# Patient Record
Sex: Female | Born: 1955 | Race: White | Hispanic: No | Marital: Married | Smoking: Former smoker
Health system: Southern US, Community
[De-identification: ages and names within clinical notes are randomized; demographics above are authoritative.]

## PROBLEM LIST (undated history)

## (undated) DIAGNOSIS — I10 Essential (primary) hypertension: Secondary | ICD-10-CM

## (undated) DIAGNOSIS — J189 Pneumonia, unspecified organism: Secondary | ICD-10-CM

## (undated) DIAGNOSIS — D689 Coagulation defect, unspecified: Secondary | ICD-10-CM

## (undated) DIAGNOSIS — M1990 Unspecified osteoarthritis, unspecified site: Secondary | ICD-10-CM

## (undated) DIAGNOSIS — R51 Headache: Secondary | ICD-10-CM

## (undated) DIAGNOSIS — C801 Malignant (primary) neoplasm, unspecified: Secondary | ICD-10-CM

## (undated) DIAGNOSIS — T7840XA Allergy, unspecified, initial encounter: Secondary | ICD-10-CM

## (undated) DIAGNOSIS — R0902 Hypoxemia: Secondary | ICD-10-CM

## (undated) DIAGNOSIS — R251 Tremor, unspecified: Secondary | ICD-10-CM

## (undated) DIAGNOSIS — G709 Myoneural disorder, unspecified: Secondary | ICD-10-CM

## (undated) DIAGNOSIS — R519 Headache, unspecified: Secondary | ICD-10-CM

## (undated) DIAGNOSIS — Z5189 Encounter for other specified aftercare: Secondary | ICD-10-CM

## (undated) DIAGNOSIS — N189 Chronic kidney disease, unspecified: Secondary | ICD-10-CM

## (undated) DIAGNOSIS — Z87442 Personal history of urinary calculi: Secondary | ICD-10-CM

## (undated) HISTORY — DX: Allergy, unspecified, initial encounter: T78.40XA

## (undated) HISTORY — DX: Hypoxemia: R09.02

## (undated) HISTORY — DX: Essential (primary) hypertension: I10

## (undated) HISTORY — DX: Coagulation defect, unspecified: D68.9

## (undated) HISTORY — PX: TONSILLECTOMY AND ADENOIDECTOMY: SHX28

## (undated) HISTORY — DX: Personal history of urinary calculi: Z87.442

## (undated) HISTORY — PX: ABDOMINAL HYSTERECTOMY: SHX81

## (undated) HISTORY — PX: WISDOM TOOTH EXTRACTION: SHX21

## (undated) HISTORY — PX: COLONOSCOPY: SHX174

## (undated) HISTORY — DX: Encounter for other specified aftercare: Z51.89

## (undated) HISTORY — DX: Unspecified osteoarthritis, unspecified site: M19.90

## (undated) HISTORY — DX: Myoneural disorder, unspecified: G70.9

---

## 1991-02-21 DIAGNOSIS — Z5189 Encounter for other specified aftercare: Secondary | ICD-10-CM

## 1991-02-21 HISTORY — DX: Encounter for other specified aftercare: Z51.89

## 2006-02-20 DIAGNOSIS — Z87442 Personal history of urinary calculi: Secondary | ICD-10-CM

## 2006-02-20 HISTORY — DX: Personal history of urinary calculi: Z87.442

## 2007-10-28 ENCOUNTER — Ambulatory Visit: Payer: Self-pay | Admitting: Occupational Medicine

## 2007-11-12 ENCOUNTER — Ambulatory Visit: Payer: Self-pay | Admitting: Occupational Medicine

## 2008-04-25 ENCOUNTER — Ambulatory Visit: Payer: Self-pay | Admitting: Family Medicine

## 2008-04-25 LAB — CONVERTED CEMR LAB: Rapid Strep: NEGATIVE

## 2010-01-26 ENCOUNTER — Ambulatory Visit: Payer: Self-pay | Admitting: Emergency Medicine

## 2010-03-22 NOTE — Assessment & Plan Note (Signed)
Summary: Cough-yellowish green, fever, Ear pain - B x 3 dys rm 5   Vital Signs:  Patient Profile:   55 Years Old Female CC:      Cold & URI symptoms Height:     64 inches Weight:      178 pounds O2 Sat:      100 % O2 treatment:    Room Air Temp:     98.4 degrees F oral Pulse rate:   116 / minute Pulse rhythm:   irregular Resp:     16 per minute BP sitting:   141 / 81  (left arm) Cuff size:   regular  Vitals Entered By: Areta Haber CMA (January 26, 2010 8:20 AM)                  Current Allergies: No known allergies History of Present Illness History from: patient Chief Complaint: Cold & URI symptoms History of Present Illness: Patient complains of onset of cold symptoms for 4 days.  They have been using Tylenol and Motrinwhich is helping a little bit. + sore throat + cough No pleuritic pain No wheezing + nasal congestion + post-nasal drainage No sinus pain/pressure No itchy/red eyes No earache No hemoptysis No SOB + chills/sweats No fever + nausea No vomiting No abdominal pain No diarrhea No skin rashes + fatigue No myalgias No headache   Current Problems: URI (ICD-465.9) RASH AND OTHER NONSPECIFIC SKIN ERUPTION (ICD-782.1) VIRAL EXANTHEM (ICD-057.9)   Current Meds TYLENOL 325 MG TABS (ACETAMINOPHEN) as directed IBUPROFEN 200 MG TABS (IBUPROFEN) as directed ZITHROMAX Z-PAK 250 MG TABS (AZITHROMYCIN) use as directed CHERATUSSIN AC 100-10 MG/5ML SYRP (GUAIFENESIN-CODEINE) 5cc Q4-6hrs as needed for cough  REVIEW OF SYSTEMS Constitutional Symptoms       Complains of fever.     Denies chills, night sweats, weight loss, weight gain, and fatigue.  Eyes       Denies change in vision, eye pain, eye discharge, glasses, contact lenses, and eye surgery. Ear/Nose/Throat/Mouth       Complains of ear pain, sore throat, and hoarseness.      Denies hearing loss/aids, change in hearing, ear discharge, dizziness, frequent runny nose, frequent nose bleeds,  sinus problems, and tooth pain or bleeding.      Comments: B x 3 dys Respiratory       Complains of productive cough.      Denies dry cough, wheezing, shortness of breath, asthma, bronchitis, and emphysema/COPD.  Cardiovascular       Denies murmurs, chest pain, and tires easily with exhertion.    Gastrointestinal       Denies stomach pain, nausea/vomiting, diarrhea, constipation, blood in bowel movements, and indigestion. Genitourniary       Denies painful urination, kidney stones, and loss of urinary control. Neurological       Denies paralysis, seizures, and fainting/blackouts. Musculoskeletal       Denies muscle pain, joint pain, joint stiffness, decreased range of motion, redness, swelling, muscle weakness, and gout.  Skin       Denies bruising, unusual mles/lumps or sores, and hair/skin or nail changes.  Psych       Denies mood changes, temper/anger issues, anxiety/stress, speech problems, depression, and sleep problems. Other Comments: yellowish green. Pt has not seen her PCP for this.   Past History:  Social History: Last updated: 04/25/2008 denies drinking, smoking or recreational drug use Physical Exam General appearance: well developed, well nourished,coughing Nasal: clear discharge Oral/Pharynx: pharyngeal erythema without exudate, uvula midline  without deviation.  clear PND Neck: tender ant cerv LAD Chest/Lungs: no rales, wheezes, or rhonchi bilateral, breath sounds equal without effort Heart: regular rate and  rhythm, no murmur Skin: no obvious rashes or lesions MSE: oriented to time, place, and person Assessment Viral URI/bronchitis  Patient Education: Patient and/or caregiver instructed in the following: rest, fluids.  Plan New Medications/Changes: CHERATUSSIN AC 100-10 MG/5ML SYRP (GUAIFENESIN-CODEINE) 5cc Q4-6hrs as needed for cough  #6oz x 0, 01/26/2010, Hoyt Koch MD ZITHROMAX Z-PAK 250 MG TABS (AZITHROMYCIN) use as directed  #1 x 0, 01/26/2010,  Hoyt Koch MD  New Orders: Est. Patient Level III [phone] Planning Comments:   1)  Take the prescribed antibiotic as instructed. 2)  Use nasal saline solution (over the counter) at least 3 times a day. 3)  Use over the counter decongestants like Zyrtec-D every 12 hours as needed to help with congestion. 4)  Can take tylenol every 6 hours or motrin every 8 hours for pain or fever. 5)  Follow up with your primary doctor  if no improvement in 5-7 days, sooner if increasing pain, fever, or new symptoms.     The patient and/or caregiver has been counseled thoroughly with regard to medications prescribed including dosage, schedule, interactions, rationale for use, and possible side effects and they verbalize understanding.  Diagnoses and expected course of recovery discussed and will return if not improved as expected or if the condition worsens. Patient and/or caregiver verbalized understanding.  Prescriptions: CHERATUSSIN AC 100-10 MG/5ML SYRP (GUAIFENESIN-CODEINE) 5cc Q4-6hrs as needed for cough  #6oz x 0   Entered and Authorized by:   Hoyt Koch MD   Signed by:   Hoyt Koch MD on 01/26/2010   Method used:   Print then Give to Patient   RxID:   [id number] ZITHROMAX Z-PAK 250 MG TABS (AZITHROMYCIN) use as directed  #1 x 0   Entered and Authorized by:   Hoyt Koch MD   Signed by:   Hoyt Koch MD on 01/26/2010   Method used:   Print then Give to Patient   RxID:   [id number]   Orders Added: 1)  Est. Patient Level III [29528]

## 2010-04-30 ENCOUNTER — Ambulatory Visit (INDEPENDENT_AMBULATORY_CARE_PROVIDER_SITE_OTHER): Payer: Self-pay | Admitting: Emergency Medicine

## 2010-04-30 ENCOUNTER — Encounter: Payer: Self-pay | Admitting: Emergency Medicine

## 2010-04-30 DIAGNOSIS — J069 Acute upper respiratory infection, unspecified: Secondary | ICD-10-CM

## 2010-05-03 NOTE — Assessment & Plan Note (Signed)
Summary: SINUS ISSUES,FEVER,COUGH,SORE THROAT/WSE(rm 4)   Vital Signs:  Patient Profile:   55 Years Old Female CC:      sick,sinus, sore throat Height:     64 inches O2 Sat:      97 % O2 treatment:    Room Air Temp:     97.5 degrees F oral Pulse rate:   98 / minute Resp:     20 per minute BP sitting:   162 / 83  (left arm) Cuff size:   regular  Vitals Entered By: Linton Flemings RN (April 30, 2010 1:52 PM)                  Updated Prior Medication List: none Current Allergies: No known allergies History of Present Illness History from: patient Chief Complaint: sick,sinus, sore throat History of Present Illness: 55 Years Old Female complains of onset of cold symptoms for 1 week.  Kimberly Griffith has been using OTC meds which is helping a little bit.  She gets sick every time her grandkids visit her.  Also her son was sick last week with a virus.  She has lots of meetings this week and needs to feel better quickly. + sore throat + cough No pleuritic pain No wheezing + nasal congestion + post-nasal drainage ++ sinus pain/pressure No chest congestion No itchy/red eyes + earache No hemoptysis No SOB + chills/sweats No fever No nausea No vomiting No abdominal pain No diarrhea No skin rashes + fatigue + myalgias + headache   REVIEW OF SYSTEMS Constitutional Symptoms       Complains of fever.     Denies chills, night sweats, weight loss, weight gain, and fatigue.  Eyes       Complains of eye pain.      Denies change in vision, eye discharge, glasses, contact lenses, and eye surgery. Ear/Nose/Throat/Mouth       Complains of frequent runny nose, sinus problems, sore throat, and hoarseness.      Denies hearing loss/aids, change in hearing, ear pain, ear discharge, dizziness, frequent nose bleeds, and tooth pain or bleeding.  Respiratory       Complains of productive cough and shortness of breath.      Denies wheezing, asthma, bronchitis, and emphysema/COPD.      Comments:  grreenish sputum Cardiovascular       Denies murmurs, chest pain, and tires easily with exhertion.    Gastrointestinal       Complains of nausea/vomiting.      Denies stomach pain, diarrhea, constipation, blood in bowel movements, and indigestion. Genitourniary       Denies painful urination, kidney stones, and loss of urinary control. Neurological       Complains of headaches.      Denies paralysis, seizures, and fainting/blackouts. Musculoskeletal       Denies muscle pain, joint pain, joint stiffness, decreased range of motion, redness, swelling, muscle weakness, and gout.  Skin       Denies bruising, unusual mles/lumps or sores, and hair/skin or nail changes.  Psych       Denies mood changes, temper/anger issues, anxiety/stress, speech problems, depression, and sleep problems. Other Comments: started one week ago after visiting grandchildren with cold   Past History:  Past Medical History: Unremarkable  Past Surgical History: Denies surgical history Physical Exam General appearance: well developed, well nourished, mild distress Head: maxillary sinus tenderness Ears: normal, no lesions or deformities Nasal: mucosa pink, nonedematous, no septal deviation, turbinates normal Oral/Pharynx: tongue normal, posterior  pharynx without erythema or exudate Neck: ant cerv tenderness Chest/Lungs: no rales, wheezes, or rhonchi bilateral, breath sounds equal without effort Heart: regular rate and  rhythm, no murmur MSE: oriented to time, place, and person Assessment New Problems: UPPER RESPIRATORY INFECTION, ACUTE (ICD-465.9)   Patient Education: Patient and/or caregiver instructed in the following: rest, fluids.  Plan New Medications/Changes: PREDNISONE 20 MG TABS (PREDNISONE) 20mg  two times a day for 4 days  #QS x 0, 04/30/2010, Hoyt Koch MD AUGMENTIN [phone] MG TABS (AMOXICILLIN-POT CLAVULANATE) 1 by mouth two times a day for 7 days  #14 x 0, 04/30/2010, Hoyt Koch  MD  New Orders: Est. Patient Level IV [04540] Pulse Oximetry (single measurment) [98119] Planning Comments:   1)  Take the prescribed antibiotic as instructed.  Prednisone will help your symptoms but not cure anything.  This may help you get back to work faster. 2)  Use nasal saline solution (over the counter) at least 3 times a day. 3)  HTN, so no decongestants 4)  Can take tylenol every 6 hours or motrin every 8 hours for pain or fever. 5)  Follow up with your primary doctor  if no improvement in 5-7 days, sooner if increasing pain, fever, or new symptoms.    The patient and/or caregiver has been counseled thoroughly with regard to medications prescribed including dosage, schedule, interactions, rationale for use, and possible side effects and they verbalize understanding.  Diagnoses and expected course of recovery discussed and will return if not improved as expected or if the condition worsens. Patient and/or caregiver verbalized understanding.  Prescriptions: PREDNISONE 20 MG TABS (PREDNISONE) 20mg  two times a day for 4 days  #QS x 0   Entered and Authorized by:   Hoyt Koch MD   Signed by:   Hoyt Koch MD on 04/30/2010   Method used:   Print then Give to Patient   RxID:   [id number] AUGMENTIN 875-125 MG TABS (AMOXICILLIN-POT CLAVULANATE) 1 by mouth two times a day for 7 days  #14 x 0   Entered and Authorized by:   Hoyt Koch MD   Signed by:   Hoyt Koch MD on 04/30/2010   Method used:   Print then Give to Patient   RxID:   [id number]   Orders Added: 1)  Est. Patient Level IV [01027] 2)  Pulse Oximetry (single measurment) [25366]

## 2011-11-21 ENCOUNTER — Ambulatory Visit (INDEPENDENT_AMBULATORY_CARE_PROVIDER_SITE_OTHER): Payer: Managed Care, Other (non HMO) | Admitting: Family Medicine

## 2011-11-21 VITALS — BP 160/83 | HR 99 | Temp 98.3°F | Resp 16 | Ht 63.7 in | Wt 195.0 lb

## 2011-11-21 DIAGNOSIS — N390 Urinary tract infection, site not specified: Secondary | ICD-10-CM

## 2011-11-21 LAB — POCT UA - MICROSCOPIC ONLY
Casts, Ur, LPF, POC: NEGATIVE
Crystals, Ur, HPF, POC: NEGATIVE
Mucus, UA: NEGATIVE
Yeast, UA: NEGATIVE

## 2011-11-21 LAB — POCT URINALYSIS DIPSTICK
Bilirubin, UA: NEGATIVE
Glucose, UA: NEGATIVE
Ketones, UA: NEGATIVE
Nitrite, UA: POSITIVE
Protein, UA: 30
Spec Grav, UA: 1.015
Urobilinogen, UA: 0.2
pH, UA: 6

## 2011-11-21 MED ORDER — PHENAZOPYRIDINE HCL 100 MG PO TABS: 100.0000 mg | ORAL_TABLET | Freq: Three times a day (TID) | ORAL | Status: DC | PRN

## 2011-11-21 MED ORDER — SULFAMETHOXAZOLE-TRIMETHOPRIM 800-160 MG PO TABS: 1.0000 | ORAL_TABLET | Freq: Two times a day (BID) | ORAL | Status: DC

## 2011-11-21 NOTE — Patient Instructions (Signed)
Urinary Tract Infection Urinary tract infections (UTIs) can develop anywhere along your urinary tract. Your urinary tract is your body's drainage system for removing wastes and extra water. Your urinary tract includes two kidneys, two ureters, a bladder, and a urethra. Your kidneys are a pair of bean-shaped organs. Each kidney is about the size of your fist. They are located below your ribs, one on each side of your spine. CAUSES Infections are caused by microbes, which are microscopic organisms, including fungi, viruses, and bacteria. These organisms are so small that they can only be seen through a microscope. Bacteria are the microbes that most commonly cause UTIs. SYMPTOMS  Symptoms of UTIs may vary by age and gender of the patient and by the location of the infection. Symptoms in young women typically include a frequent and intense urge to urinate and a painful, burning feeling in the bladder or urethra during urination. Older women and men are more likely to be tired, shaky, and weak and have muscle aches and abdominal pain. A fever may mean the infection is in your kidneys. Other symptoms of a kidney infection include pain in your back or sides below the ribs, nausea, and vomiting. DIAGNOSIS To diagnose a UTI, your caregiver will ask you about your symptoms. Your caregiver also will ask to provide a urine sample. The urine sample will be tested for bacteria and white blood cells. White blood cells are made by your body to help fight infection. TREATMENT  Typically, UTIs can be treated with medication. Because most UTIs are caused by a bacterial infection, they usually can be treated with the use of antibiotics. The choice of antibiotic and length of treatment depend on your symptoms and the type of bacteria causing your infection. HOME CARE INSTRUCTIONS  If you were prescribed antibiotics, take them exactly as your caregiver instructs you. Finish the medication even if you feel better after you  have only taken some of the medication.  Drink enough water and fluids to keep your urine clear or pale yellow.  Avoid caffeine, tea, and carbonated beverages. They tend to irritate your bladder.  Empty your bladder often. Avoid holding urine for long periods of time.  Empty your bladder before and after sexual intercourse.  After a bowel movement, women should cleanse from front to back. Use each tissue only once. SEEK MEDICAL CARE IF:   You have back pain.  You develop a fever.  Your symptoms do not begin to resolve within 3 days. SEEK IMMEDIATE MEDICAL CARE IF:   You have severe back pain or lower abdominal pain.  You develop chills.  You have nausea or vomiting.  You have continued burning or discomfort with urination. MAKE SURE YOU:   Understand these instructions.  Will watch your condition.  Will get help right away if you are not doing well or get worse. Document Released: 11/16/2004 Document Revised: 08/08/2011 Document Reviewed: 03/17/2011 [employer] Patient Information 2013 [employer].  

## 2011-11-21 NOTE — Progress Notes (Signed)
Subjective:   Results for orders placed in visit on 11/21/11  POCT URINALYSIS DIPSTICK      Component Value Range   Color, UA yellow     Clarity, UA cloudy     Glucose, UA neg     Bilirubin, UA neg     Ketones, UA neg     Spec Grav, UA 1.015     Blood, UA large     pH, UA 6.0     Protein, UA 30     Urobilinogen, UA 0.2     Nitrite, UA positive     Leukocytes, UA large (3+)    POCT UA - MICROSCOPIC ONLY      Component Value Range   WBC, Ur, HPF, POC TNTC     RBC, urine, microscopic TNTC     Bacteria, U Microscopic 3+ bacilli     Mucus, UA neg     Epithelial cells, urine per micros 0-1     Crystals, Ur, HPF, POC neg     Casts, Ur, LPF, POC neg     Yeast, UA neg    S:  Dysuria and frequency since yesterday  Hx of prior UTI, several years ago  Hx of kidney stones in the past  O; Abd mild tenderness. No CVA tenderness  A: UTI  Plan: Bactrim pyridium

## 2011-11-24 LAB — URINE CULTURE: Colony Count: >=100000

## 2011-11-26 MED ORDER — NITROFURANTOIN MONOHYD MACRO 100 MG PO CAPS: 100.0000 mg | ORAL_CAPSULE | Freq: Two times a day (BID) | ORAL | Status: DC

## 2011-11-26 NOTE — Addendum Note (Signed)
Addended by: Johnnette Litter on: 11/26/2011 08:42 AM   Modules accepted: Orders

## 2011-12-08 DIAGNOSIS — Z23 Encounter for immunization: Secondary | ICD-10-CM

## 2013-01-31 ENCOUNTER — Ambulatory Visit (INDEPENDENT_AMBULATORY_CARE_PROVIDER_SITE_OTHER): Payer: Managed Care, Other (non HMO)

## 2013-01-31 DIAGNOSIS — Z23 Encounter for immunization: Secondary | ICD-10-CM

## 2013-02-20 DIAGNOSIS — J189 Pneumonia, unspecified organism: Secondary | ICD-10-CM

## 2013-02-20 HISTORY — DX: Pneumonia, unspecified organism: J18.9

## 2013-10-19 ENCOUNTER — Ambulatory Visit (INDEPENDENT_AMBULATORY_CARE_PROVIDER_SITE_OTHER): Payer: Managed Care, Other (non HMO) | Admitting: Internal Medicine

## 2013-10-19 VITALS — BP 122/84 | HR 100 | Temp 98.4°F | Resp 15 | Ht 64.0 in | Wt 210.4 lb

## 2013-10-19 DIAGNOSIS — L259 Unspecified contact dermatitis, unspecified cause: Secondary | ICD-10-CM

## 2013-10-19 DIAGNOSIS — L309 Dermatitis, unspecified: Secondary | ICD-10-CM

## 2013-10-19 DIAGNOSIS — R5383 Other fatigue: Secondary | ICD-10-CM

## 2013-10-19 DIAGNOSIS — J019 Acute sinusitis, unspecified: Secondary | ICD-10-CM

## 2013-10-19 DIAGNOSIS — R5381 Other malaise: Secondary | ICD-10-CM

## 2013-10-19 DIAGNOSIS — R635 Abnormal weight gain: Secondary | ICD-10-CM

## 2013-10-19 DIAGNOSIS — R059 Cough, unspecified: Secondary | ICD-10-CM

## 2013-10-19 DIAGNOSIS — R05 Cough: Secondary | ICD-10-CM

## 2013-10-19 LAB — COMPREHENSIVE METABOLIC PANEL
ALT: 24 U/L (ref 0–35)
AST: 20 U/L (ref 0–37)
Albumin: 4.5 g/dL (ref 3.5–5.2)
Alkaline Phosphatase: 74 U/L (ref 39–117)
BUN: 9 mg/dL (ref 6–23)
CO2: 22 meq/L (ref 19–32)
Calcium: 9.2 mg/dL (ref 8.4–10.5)
Chloride: 104 mEq/L (ref 96–112)
Creat: 0.72 mg/dL (ref 0.50–1.10)
GLUCOSE: 112 mg/dL — AB (ref 70–99)
Potassium: 4.1 mEq/L (ref 3.5–5.3)
SODIUM: 138 meq/L (ref 135–145)
TOTAL PROTEIN: 7.8 g/dL (ref 6.0–8.3)
Total Bilirubin: 0.9 mg/dL (ref 0.2–1.2)

## 2013-10-19 LAB — POCT CBC
Granulocyte percent: 67.1 %G (ref 37–80)
HCT, POC: 49.9 % — AB (ref 37.7–47.9)
Hemoglobin: 16 g/dL (ref 12.2–16.2)
LYMPH, POC: 2.5 (ref 0.6–3.4)
MCH: 27.7 pg (ref 27–31.2)
MCHC: 32 g/dL (ref 31.8–35.4)
MCV: 86.3 fL (ref 80–97)
MID (CBC): 0.8 (ref 0–0.9)
MPV: 7.8 fL (ref 0–99.8)
PLATELET COUNT, POC: 225 10*3/uL (ref 142–424)
POC Granulocyte: 6.8 (ref 2–6.9)
POC LYMPH %: 24.8 % (ref 10–50)
POC MID %: 8.1 % (ref 0–12)
RBC: 5.78 M/uL — AB (ref 4.04–5.48)
RDW, POC: 14.5 %
WBC: 10.1 10*3/uL (ref 4.6–10.2)

## 2013-10-19 LAB — LIPID PANEL
CHOLESTEROL: 227 mg/dL — AB (ref 0–200)
HDL: 37 mg/dL — ABNORMAL LOW (ref >39)
LDL Cholesterol: 156 mg/dL — ABNORMAL HIGH (ref 0–99)
Total CHOL/HDL Ratio: 6.1 Ratio
Triglycerides: 171 mg/dL — ABNORMAL HIGH (ref <150)
VLDL: 34 mg/dL (ref 0–40)

## 2013-10-19 LAB — T4, FREE: Free T4: 1.08 ng/dL (ref 0.80–1.80)

## 2013-10-19 LAB — TSH: TSH: 0.487 u[IU]/mL (ref 0.350–4.500)

## 2013-10-19 LAB — POCT SEDIMENTATION RATE: POCT SED RATE: 25 mm/hr — AB (ref 0–22)

## 2013-10-19 MED ORDER — AMOXICILLIN 875 MG PO TABS: 875.0000 mg | ORAL_TABLET | Freq: Two times a day (BID) | ORAL | Status: DC

## 2013-10-19 MED ORDER — TRIAMCINOLONE 0.1 % CREAM:EUCERIN CREAM 1:1: 1.0000 "application " | TOPICAL_CREAM | Freq: Two times a day (BID) | CUTANEOUS | Status: DC | PRN

## 2013-10-19 MED ORDER — HYDROCODONE-HOMATROPINE 5-1.5 MG/5ML PO SYRP: 5.0000 mL | ORAL_SOLUTION | Freq: Four times a day (QID) | ORAL | Status: DC | PRN

## 2013-10-19 NOTE — Progress Notes (Signed)
Subjective:    Patient ID: Kimberly Griffith, female    DOB: 1956-01-29, 58 y.o.   MRN: [id number]  HPI  Chief Complaint  Patient presents with  . Fever    x 4 days  . Sore Throat    x 4 days  . Nasal Congestion    clear drainage x 4 days  Ears hurt as well  [facility] fam hx htn--no dx for her  Review of Systems Weight gain despite low cal intake Often cold Losing eyebrows Dry coarse skin Hx eczema Fatigue desp sed job Often sleepy BMs 2/d on probiotics--lots if not--hx ? UC 75yr ago ruled out//no colonos since 50     Objective:   Physical Exam  Constitutional: She appears well-developed and well-nourished.  Overweight  HENT:  Right Ear: External ear normal.  Left Ear: External ear normal.  Mouth/Throat: Oropharynx is clear and moist.  Nares with purulent discharge and tender maxillary areas to percussion  Eyes: Pupils are equal, round, and reactive to light.  Conjunctivae injected  Neck: No thyromegaly present.  Cardiovascular: Normal rate and regular rhythm.   No murmur heard. Pulmonary/Chest: Effort normal and breath sounds normal.  Lymphadenopathy:    She has no cervical adenopathy.  Skin:  Several areas of active eczema and dryness   Results for orders placed in visit on 10/19/13  POCT CBC      Result Value Ref Range   WBC 10.1  4.6 - 10.2 K/uL   Lymph, poc 2.5  0.6 - 3.4   POC LYMPH PERCENT 24.8  10 - 50 %L   MID (cbc) 0.8  0 - 0.9   POC MID % 8.1  0 - 12 %M   POC Granulocyte 6.8  2 - 6.9   Granulocyte percent 67.1  37 - 80 %G   RBC 5.78 (*) 4.04 - 5.48 M/uL   Hemoglobin 16.0  12.2 - 16.2 g/dL   HCT, POC 49.9 (*) 37.7 - 47.9 %   MCV 86.3  80 - 97 fL   MCH, POC 27.7  27 - 31.2 pg   MCHC 32.0  31.8 - 35.4 g/dL   RDW, POC 14.5     Platelet Count, POC 225  142 - 424 K/uL   MPV 7.8  0 - 99.8 fL    BP 122/84  Pulse 100  Temp(Src) 98.4 F (36.9 C) (Oral)  Resp 15  Ht 5\' 4"  (1.626 m)  Wt 210 lb 6.4 oz (95.437 kg)  BMI 36.10 kg/m2   SpO2 97% BP Readings from Last 3 Encounters:  10/19/13 122/84  11/21/11 160/83  04/30/10 162/83   Wt Readings from Last 3 Encounters:  10/19/13 210 lb 6.4 oz (95.437 kg)  11/21/11 195 lb (88.451 kg)  01/26/10 178 lb (80.74 kg)      Assessment & Plan:  Weight gain - Plan: POCT SEDIMENTATION RATE, POCT CBC, Comprehensive metabolic panel, TSH, T4, free, Lipid panel  Other malaise and fatigue - Plan: TSH, T4, free  Sinusitis with cough  Eczema  Meds ordered this encounter  Medications  . acetaminophen (TYLENOL) 500 MG tablet    Sig: Take 500 mg by mouth every 6 (six) hours as needed.  . Triamcinolone Acetonide (TRIAMCINOLONE 0.1 % CREAM : EUCERIN) CREA    Sig: Apply 1 application topically 2 (two) times daily as needed. 50:50 mix 360 gm    Dispense:  1 each    Refill:  5  . amoxicillin (AMOXIL) 875 MG  tablet    Sig: Take 1 tablet (875 mg total) by mouth 2 (two) times daily.    Dispense:  20 tablet    Refill:  0  . HYDROcodone-homatropine (HYCODAN) 5-1.5 MG/5ML syrup    Sig: Take 5 mLs by mouth every 6 (six) hours as needed.    Dispense:  120 mL    Refill:  0   Call w/ labs Needs health maint exam 104

## 2013-10-22 ENCOUNTER — Encounter: Payer: Self-pay | Admitting: Internal Medicine

## 2013-12-05 ENCOUNTER — Ambulatory Visit (INDEPENDENT_AMBULATORY_CARE_PROVIDER_SITE_OTHER): Payer: Managed Care, Other (non HMO) | Admitting: Physician Assistant

## 2013-12-05 VITALS — BP 132/80 | HR 101 | Temp 98.8°F | Resp 16 | Ht 64.0 in | Wt 215.6 lb

## 2013-12-05 DIAGNOSIS — R0981 Nasal congestion: Secondary | ICD-10-CM

## 2013-12-05 DIAGNOSIS — R05 Cough: Secondary | ICD-10-CM

## 2013-12-05 DIAGNOSIS — R52 Pain, unspecified: Secondary | ICD-10-CM

## 2013-12-05 DIAGNOSIS — R059 Cough, unspecified: Secondary | ICD-10-CM

## 2013-12-05 MED ORDER — HYDROCOD POLST-CHLORPHEN POLST 10-8 MG/5ML PO LQCR: 5.0000 mL | Freq: Two times a day (BID) | ORAL | Status: DC | PRN

## 2013-12-05 MED ORDER — IPRATROPIUM BROMIDE 0.03 % NA SOLN: 2.0000 | Freq: Two times a day (BID) | NASAL | Status: DC

## 2013-12-05 MED ORDER — GUAIFENESIN ER 1200 MG PO TB12: 1.0000 | ORAL_TABLET | Freq: Two times a day (BID) | ORAL | Status: DC | PRN

## 2013-12-05 NOTE — Progress Notes (Addendum)
I have discussed this patient with Ms. Drenda Freeze and agree.

## 2013-12-05 NOTE — Progress Notes (Signed)
Subjective:    Patient ID: Kimberly Griffith, female    DOB: 1955-11-08, 58 y.o.   MRN: [id number]  Cough Associated symptoms include chills, a fever, headaches, myalgias and a sore throat. Pertinent negatives include no ear pain. There is no history of environmental allergies.  Sore Throat  Associated symptoms include congestion, coughing and headaches. Pertinent negatives include no diarrhea, ear pain or vomiting.  Headache  Associated symptoms include coughing, a fever, nausea and a sore throat. Pertinent negatives include no ear pain, sinus pressure or vomiting.    This is a 58 year old female with no significant PMH who is presenting with 5 days of sore throat, cough, nasal congestion, fever, chills and generalized body aches. It started the day after getting home from a cruise. On the cruise her grandson and husband were sick as well, and both have improved at this point. She reports the illness started with a sore throat and rapidly progressed to fever, chills and her other symptoms. She had some nausea yesterday but no vomiting. She has been taking HBP coricidan for symptoms and fever. Her fever has been around 102. This morning it was 101. She took the HBP coricidan before coming into clinic today. She got the flu shot 2 weeks ago. She is not an asthmatic and not a smoker.   Review of Systems  Constitutional: Positive for fever, chills and fatigue.  HENT: Positive for congestion and sore throat. Negative for ear pain and sinus pressure.   Eyes: Negative.   Respiratory: Positive for cough.   Gastrointestinal: Positive for nausea. Negative for vomiting and diarrhea.  Musculoskeletal: Positive for myalgias.  Skin: Negative.   Allergic/Immunologic: Negative for environmental allergies.  Neurological: Positive for headaches.   Current Outpatient Prescriptions on File Prior to Visit  Medication Sig Dispense Refill  . acetaminophen (TYLENOL) 500 MG tablet Take 500 mg by mouth every 6 (six)  hours as needed.      . Triamcinolone Acetonide (TRIAMCINOLONE 0.1 % CREAM : EUCERIN) CREA Apply 1 application topically 2 (two) times daily as needed. 50:50 mix 360 gm  1 each  5   No current facility-administered medications on file prior to visit.       Objective:   Physical Exam  Constitutional: She is oriented to person, place, and time. She appears well-developed and well-nourished. No distress.  HENT:  Head: Normocephalic and atraumatic.  Right Ear: Hearing, tympanic membrane, external ear and ear canal normal.  Left Ear: Hearing, tympanic membrane, external ear and ear canal normal.  Nose: Mucosal edema present. Right sinus exhibits no maxillary sinus tenderness and no frontal sinus tenderness. Left sinus exhibits no maxillary sinus tenderness and no frontal sinus tenderness.  Mouth/Throat: Uvula is midline and mucous membranes are normal. Posterior oropharyngeal erythema present. No oropharyngeal exudate or posterior oropharyngeal edema.  Eyes: Conjunctivae and lids are normal. Right eye exhibits no discharge. Left eye exhibits no discharge. No scleral icterus.  Cardiovascular: Normal rate, regular rhythm, normal heart sounds and intact distal pulses.   Pulmonary/Chest: Effort normal and breath sounds normal. Not tachypneic. No respiratory distress. She has no wheezes. She has no rhonchi. She has no rales.  Lymphadenopathy:    She has no cervical adenopathy.  Neurological: She is alert and oriented to person, place, and time.  Skin: Skin is warm, dry and intact. No rash noted. She is not diaphoretic.  Psychiatric: She has a normal mood and affect. Her speech is normal and behavior is normal. Thought content normal.  Assessment & Plan:  1. Cough 2. Nasal congestion 3. Body aches  Patient likely has influenza as supported by the rapid onset of her symptoms and the duration of her symptoms. She does not have cormorbid conditions necessitating flu treatment. We discussed the  need for plenty of rest and plenty of fluids. She was prescribed tussionex, mucinex and atrovent for symptom control. She may continue to take the HBP coricidan for her fever. She will return if her symptoms fail to improve.  - Guaifenesin (MUCINEX MAXIMUM STRENGTH) 1200 MG TB12; Take 1 tablet (1,200 mg total) by mouth every 12 (twelve) hours as needed.  Dispense: 14 tablet; Refill: 1 - chlorpheniramine-HYDROcodone (TUSSIONEX PENNKINETIC ER) 10-8 MG/5ML LQCR; Take 5 mLs by mouth every 12 (twelve) hours as needed for cough (cough).  Dispense: 100 mL; Refill: 0 - ipratropium (ATROVENT) 0.03 % nasal spray; Place 2 sprays into both nostrils 2 (two) times daily.  Dispense: 30 mL; Refill: 0   Nicole V. Drenda Freeze, MHS [facility]  12/05/2013

## 2013-12-05 NOTE — Patient Instructions (Signed)
Get plenty of rest, drink plenty of water.  Keep taking tylenol for fever and discomfort as needed.

## 2013-12-13 ENCOUNTER — Ambulatory Visit (INDEPENDENT_AMBULATORY_CARE_PROVIDER_SITE_OTHER): Payer: Managed Care, Other (non HMO)

## 2013-12-13 ENCOUNTER — Ambulatory Visit (INDEPENDENT_AMBULATORY_CARE_PROVIDER_SITE_OTHER): Payer: Managed Care, Other (non HMO) | Admitting: Family Medicine

## 2013-12-13 VITALS — BP 124/86 | HR 90 | Temp 98.1°F | Resp 16 | Ht 64.0 in | Wt 215.0 lb

## 2013-12-13 DIAGNOSIS — R05 Cough: Secondary | ICD-10-CM

## 2013-12-13 DIAGNOSIS — R059 Cough, unspecified: Secondary | ICD-10-CM

## 2013-12-13 DIAGNOSIS — B89 Unspecified parasitic disease: Secondary | ICD-10-CM

## 2013-12-13 DIAGNOSIS — J189 Pneumonia, unspecified organism: Secondary | ICD-10-CM

## 2013-12-13 DIAGNOSIS — J029 Acute pharyngitis, unspecified: Secondary | ICD-10-CM

## 2013-12-13 MED ORDER — FIRST-DUKES MOUTHWASH MT SUSP: 5.0000 mL | OROMUCOSAL | Status: DC | PRN

## 2013-12-13 MED ORDER — LEVOFLOXACIN 500 MG PO TABS: 500.0000 mg | ORAL_TABLET | Freq: Every day | ORAL | Status: DC

## 2013-12-13 MED ORDER — HYDROCOD POLST-CHLORPHEN POLST 10-8 MG/5ML PO LQCR: 5.0000 mL | Freq: Two times a day (BID) | ORAL | Status: AC | PRN

## 2013-12-13 NOTE — Addendum Note (Signed)
Addended by: Robyn Haber on: 12/13/2013 03:47 PM   Modules accepted: Orders

## 2013-12-13 NOTE — Progress Notes (Signed)
Patient ID: Kimberly Griffith MRN: [id number], DOB: 1955/08/30, 58 y.o. Date of Encounter: 12/13/2013, 3:40 PM  This chart was scribed for Dr. Robyn Haber, MD by Erling Conte, Medical Scribe. This patient was seen in Room 11 and the patient's care was started at 3:18 PM.  Primary Physician: PROVIDER NOT IN SYSTEM  Chief Complaint: cough  HPI: 58 y.o. year old female with history below presents with cough for 11 days. Pt states that she presented to our office on 12/05/13 with the same symptoms and was told she had the flu but she notes that she has not gotten any better. Pt has had associated sore throat, chest tightness, nasal congestion, chills, generalized body aches and fever. Pt states that the fever resolved 2 days ago. Also has sharp left sided pain and believes it to be contributory to pulling a muscle from coughing so much. She notes that the symptoms began the day she got home from a cruise from [place]. She states that her son and husband have been sick as well. Pt denies any GI complaints.    Past Medical History  Diagnosis Date  . Allergy     kiwi     Home Meds: Prior to Admission medications   Medication Sig Start Date End Date Taking? Authorizing Provider  acetaminophen (TYLENOL) 500 MG tablet Take 500 mg by mouth every 6 (six) hours as needed.   Yes Historical Provider, MD  chlorpheniramine-HYDROcodone (TUSSIONEX PENNKINETIC ER) 10-8 MG/5ML LQCR Take 5 mLs by mouth every 12 (twelve) hours as needed for cough (cough). 12/05/13 12/19/13 Yes Nicole Bush V, PA-C  Guaifenesin (MUCINEX MAXIMUM STRENGTH) 1200 MG TB12 Take 1 tablet (1,200 mg total) by mouth every 12 (twelve) hours as needed. 12/05/13  Yes Bennett Scrape V, PA-C  ipratropium (ATROVENT) 0.03 % nasal spray Place 2 sprays into both nostrils 2 (two) times daily. 12/05/13  Yes Bennett Scrape V, PA-C  Triamcinolone Acetonide (TRIAMCINOLONE 0.1 % CREAM : EUCERIN) CREA Apply 1 application topically 2 (two) times daily as  needed. 50:50 mix 360 gm 10/19/13  Yes Leandrew Koyanagi, MD    Allergies: No Known Allergies  History   Social History  . Marital Status: Married    Spouse Name: N/A    Number of Children: N/A  . Years of Education: N/A   Occupational History  . Not on file.   Social History Main Topics  . Smoking status: Former Research scientist (life sciences)  . Smokeless tobacco: Never Used  . Alcohol Use: No  . Drug Use: No  . Sexual Activity: Not on file   Other Topics Concern  . Not on file   Social History Narrative  . No narrative on file     Review of Systems: Constitutional: positive for fever, chills. negative for  night sweats, weight changes, or fatigue  HEENT: positive for sore throat.  negative for vision changes, hearing loss, congestion, rhinorrhea, ST, epistaxis, or sinus pressure Cardiovascular: negative for chest pain or palpitations Respiratory: positive for cough, chest tightness, congestion. negative for hemoptysis, wheezing, shortness of breath Abdominal: negative for abdominal pain, nausea, vomiting, diarrhea, or constipation Msk: positive for generalized body aches Dermatological: negative for rash Neurologic: negative for headache, dizziness, or syncope All other systems reviewed and are otherwise negative with the exception to those above and in the HPI.   Physical Exam: Blood pressure 124/86, pulse 90, temperature 98.1 F (36.7 C), temperature source Oral, resp. rate 16, height 5\' 4"  (1.626 m), weight 215 lb (97.523 kg), SpO2 99.00%.,  Body mass index is 36.89 kg/(m^2). General: Well developed, well nourished, in no acute distress. Head: Normocephalic, atraumatic, eyes without discharge, sclera non-icteric, nares are without discharge. Bilateral auditory canals clear, TM's are without perforation, pearly grey and translucent with reflective cone of light bilaterally. Oral cavity moist, reddened posterior pharynx without exudate or peritonsillar abscess. Absent tonsils.  Neck: Supple.  No thyromegaly. Full ROM. No lymphadenopathy. Lungs: Clear bilaterally to auscultation without wheezes or rales. Diffuse bilateral rhonchi. Breathing is unlabored.  Congested cough  Heart: RRR with S1 S2. No murmurs, rubs, or gallops appreciated. Abdomen: Soft, non-tender, non-distended with normoactive bowel sounds. No hepatomegaly. No rebound/guarding. No obvious abdominal masses. Msk:  Strength and tone normal for age. Extremities/Skin: Warm and dry. No clubbing or cyanosis. No edema. No rashes or suspicious lesions. Neuro: Alert and oriented X 3. Moves all extremities spontaneously. Gait is normal. CNII-XII grossly in tact. Psych:  Responds to questions appropriately with a normal affect.     [facility] reading (PRIMARY) by  Dr. Joseph Art CXR shows left lower lobe infiltrated    ASSESSMENT AND PLAN:  58 y.o. year old female with   The encounter diagnosis was Cough. Cough - Plan: DG Chest 2 View, levofloxacin (LEVAQUIN) 500 MG tablet, chlorpheniramine-HYDROcodone (TUSSIONEX PENNKINETIC ER) 10-8 MG/5ML LQCR  Pneumonia due to infectious agent - Plan: levofloxacin (LEVAQUIN) 500 MG tablet    Signed, Robyn Haber, MD 12/13/2013 3:40 PM  I personally performed the services described in this documentation, which was scribed in my presence. The recorded information has been reviewed and is accurate.

## 2013-12-13 NOTE — Patient Instructions (Signed)

## 2014-03-04 ENCOUNTER — Ambulatory Visit (INDEPENDENT_AMBULATORY_CARE_PROVIDER_SITE_OTHER): Payer: Managed Care, Other (non HMO) | Admitting: Physician Assistant

## 2014-03-04 VITALS — BP 128/86 | HR 106 | Temp 98.7°F | Resp 18 | Ht 64.0 in | Wt 210.0 lb

## 2014-03-04 DIAGNOSIS — J069 Acute upper respiratory infection, unspecified: Secondary | ICD-10-CM

## 2014-03-04 DIAGNOSIS — B9789 Other viral agents as the cause of diseases classified elsewhere: Principal | ICD-10-CM

## 2014-03-04 MED ORDER — LEVOFLOXACIN 500 MG PO TABS: 500.0000 mg | ORAL_TABLET | Freq: Every day | ORAL | Status: DC

## 2014-03-04 MED ORDER — MAGIC MOUTHWASH W/LIDOCAINE: 10.0000 mL | ORAL | Status: DC | PRN

## 2014-03-04 MED ORDER — HYDROCOD POLST-CHLORPHEN POLST 10-8 MG/5ML PO LQCR: 5.0000 mL | Freq: Two times a day (BID) | ORAL | Status: AC | PRN

## 2014-03-04 NOTE — Patient Instructions (Addendum)
Use mouthwash every 2-3 hours for sore throat. Use cough syrup at night for sleep. Use tylenol/ibuprofen for fever. Return if not better in 7-10 days.

## 2014-03-04 NOTE — Progress Notes (Signed)
Subjective:    Patient ID: Kimberly Griffith, female    DOB: 15-Jul-1955, 59 y.o.   MRN: [id number]  HPI  This is a 59 year old female with no significant PMH who is presenting with sore throat, sneezing, cough and fever. Fever of 102 last night, 101 this morning. She is complaining of a headache located to the top of her head. Cough is dry, sometimes productive of yellow sputum. She is not sleeping well d/t coughing She denies otalgia, SOB or wheezing. Her husband was treated for a sinus infection 4 days ago. She was sick 1 month ago with pneumonia. She has tried tylenol and mucinex without much relief. She does not have a history of asthma or and is not a smoker. Pt is worried because she lives with someone who is about be induced for labor and does not want to get mother/baby sick.  Review of Systems  Constitutional: Positive for fever, chills and fatigue.  HENT: Positive for congestion, sinus pressure, sneezing and sore throat. Negative for ear pain.   Eyes: Negative for redness.  Respiratory: Positive for cough. Negative for shortness of breath and wheezing.   Gastrointestinal: Negative for nausea, vomiting and diarrhea.  Skin: Negative for rash.  Neurological: Positive for headaches.  Hematological: Negative for adenopathy.  Psychiatric/Behavioral: Positive for sleep disturbance.    Patient Active Problem List   Diagnosis Date Noted  . RASH AND OTHER NONSPECIFIC SKIN ERUPTION 11/12/2007  . VIRAL EXANTHEM 10/28/2007   Prior to Admission medications   Medication Sig Start Date End Date Taking? Authorizing Provider  acetaminophen (TYLENOL) 500 MG tablet Take 500 mg by mouth every 6 (six) hours as needed.   Yes Historical Provider, MD   No Known Allergies  Patient's social and family history were reviewed.      Objective:   Physical Exam  Constitutional: She is oriented to person, place, and time. She appears well-developed and well-nourished. No distress.  HENT:  Head:  Normocephalic and atraumatic.  Right Ear: Hearing, external ear and ear canal normal. Tympanic membrane is retracted.  Left Ear: Hearing, external ear and ear canal normal. Tympanic membrane is retracted.  Nose: Right sinus exhibits frontal sinus tenderness. Right sinus exhibits no maxillary sinus tenderness. Left sinus exhibits frontal sinus tenderness. Left sinus exhibits no maxillary sinus tenderness.  Mouth/Throat: Uvula is midline and mucous membranes are normal. Posterior oropharyngeal erythema present. No oropharyngeal exudate or posterior oropharyngeal edema.  Eyes: Conjunctivae and lids are normal. Right eye exhibits no discharge. Left eye exhibits no discharge. No scleral icterus.  Cardiovascular: Regular rhythm, normal heart sounds, intact distal pulses and normal pulses.   No murmur heard. Tachycardia to 106  Pulmonary/Chest: Effort normal and breath sounds normal. No respiratory distress. She has no wheezes. She has no rhonchi. She has no rales.  Musculoskeletal: Normal range of motion.  Lymphadenopathy:       Head (right side): No submental, no submandibular and no tonsillar adenopathy present.       Head (left side): No submental, no submandibular and no tonsillar adenopathy present.    She has no cervical adenopathy.  Neurological: She is alert and oriented to person, place, and time.  Skin: Skin is warm, dry and intact. No lesion and no rash noted.  Psychiatric: She has a normal mood and affect. Her speech is normal and behavior is normal. Thought content normal.  BP 128/86 mmHg  Pulse 106  Temp(Src) 98.7 F (37.1 C) (Oral)  Resp 18  Ht 5'  4" (1.626 m)  Wt 210 lb (95.255 kg)  BMI 36.03 kg/m2  SpO2 97%     Assessment & Plan:  1. Viral URI with cough This is likely a viral URI. However, pt has been sick recently and is very concerned that this will also turn into a bacterial illness. Gave pt print out of levaqin that she can fill in 3 days if symptoms do not start to  improve. Will return in 7-10 days if symptoms worsen or fail to improve.  - chlorpheniramine-HYDROcodone (TUSSIONEX PENNKINETIC ER) 10-8 MG/5ML LQCR; Take 5 mLs by mouth every 12 (twelve) hours as needed for cough (cough).  Dispense: 80 mL; Refill: 0 - Alum & Mag Hydroxide-Simeth (MAGIC MOUTHWASH W/LIDOCAINE) SOLN; Take 10 mLs by mouth every 2 (two) hours as needed for mouth pain.  Dispense: 360 mL; Refill: 0 - levofloxacin (LEVAQUIN) 500 MG tablet; Take 1 tablet (500 mg total) by mouth daily.  Dispense: 7 tablet; Refill: 0   Benjaman Pott. Drenda Freeze, MHS [facility]  03/05/2014

## 2014-06-30 ENCOUNTER — Ambulatory Visit (INDEPENDENT_AMBULATORY_CARE_PROVIDER_SITE_OTHER): Payer: Managed Care, Other (non HMO) | Admitting: Family Medicine

## 2014-06-30 VITALS — BP 124/84 | HR 95 | Temp 98.1°F | Resp 18 | Ht 63.5 in | Wt 212.0 lb

## 2014-06-30 DIAGNOSIS — R300 Dysuria: Secondary | ICD-10-CM | POA: Diagnosis not present

## 2014-06-30 DIAGNOSIS — R8281 Pyuria: Secondary | ICD-10-CM

## 2014-06-30 DIAGNOSIS — R319 Hematuria, unspecified: Secondary | ICD-10-CM | POA: Diagnosis not present

## 2014-06-30 DIAGNOSIS — N318 Other neuromuscular dysfunction of bladder: Secondary | ICD-10-CM | POA: Diagnosis not present

## 2014-06-30 DIAGNOSIS — N390 Urinary tract infection, site not specified: Secondary | ICD-10-CM

## 2014-06-30 LAB — POCT URINALYSIS DIPSTICK
Glucose, UA: NEGATIVE
Nitrite, UA: POSITIVE
PH UA: 6.5
Protein, UA: >=300
SPEC GRAV UA: 1.025
Urobilinogen, UA: 1

## 2014-06-30 LAB — POCT UA - MICROSCOPIC ONLY
CRYSTALS, UR, HPF, POC: NEGATIVE
Casts, Ur, LPF, POC: NEGATIVE
Epithelial cells, urine per micros: NEGATIVE
Mucus, UA: NEGATIVE
Yeast, UA: NEGATIVE

## 2014-06-30 MED ORDER — PHENAZOPYRIDINE HCL 200 MG PO TABS
200.0000 mg | ORAL_TABLET | Freq: Three times a day (TID) | ORAL | Status: DC | PRN
Start: 2014-06-30 — End: 2014-12-15

## 2014-06-30 MED ORDER — CIPROFLOXACIN HCL 500 MG PO TABS: 500.0000 mg | ORAL_TABLET | Freq: Two times a day (BID) | ORAL | Status: DC

## 2014-06-30 NOTE — Patient Instructions (Signed)

## 2014-06-30 NOTE — Progress Notes (Signed)
This a 59 year old woman who developed urgency and dysuria this morning at 2 AM. She subsequently developed hematuria with clots. She's had no fever or flank pain.  Her last UTI was 3 years ago and this is reminiscent of that episode.   Objective: Healthy-appearing middle-aged woman in no acute distress BP 124/84 mmHg  Pulse 95  Temp(Src) 98.1 F (36.7 C) (Oral)  Resp 18  Ht 5' 3.5" (1.613 m)  Wt 212 lb (96.163 kg)  BMI 36.96 kg/m2  SpO2 96% No CVAT  Results for orders placed or performed in visit on 06/30/14  POCT urinalysis dipstick  Result Value Ref Range   Color, UA dark red    Clarity, UA turbid    Glucose, UA neg    Bilirubin, UA moderate    Ketones, UA trace    Spec Grav, UA 1.025    Blood, UA large    pH, UA 6.5    Protein, UA >=300    Urobilinogen, UA 1.0    Nitrite, UA positive    Leukocytes, UA large (3+)    This chart was scribed in my presence and reviewed by me personally.    ICD-9-CM ICD-10-CM   1. Blood in urine 599.70 R31.9 POCT UA - Microscopic Only     POCT urinalysis dipstick     Urine culture     ciprofloxacin (CIPRO) 500 MG tablet     phenazopyridine (PYRIDIUM) 200 MG tablet  2. Dysuria 788.1 R30.0 Urine culture     ciprofloxacin (CIPRO) 500 MG tablet     phenazopyridine (PYRIDIUM) 200 MG tablet  3. 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 Pyuria 791.9 N39.0 Urine culture     ciprofloxacin (CIPRO) 500 MG tablet     phenazopyridine (PYRIDIUM) 200 MG tablet     Signed, Robyn Haber, MD

## 2014-06-30 NOTE — Addendum Note (Signed)
Addended by: Constance Goltz on: 06/30/2014 05:10 PM   Modules accepted: Miquel Dunn

## 2014-07-02 ENCOUNTER — Telehealth: Payer: Self-pay

## 2014-07-02 NOTE — Telephone Encounter (Signed)
Left message for pt to call back  °

## 2014-07-02 NOTE — Telephone Encounter (Signed)
lmom to cb. 

## 2014-07-02 NOTE — Telephone Encounter (Signed)
Pt says she missed her CB, and would like another. She thinks it may be concerning her visit from 5/10. Please advise at [phone]

## 2014-07-02 NOTE — Telephone Encounter (Signed)
Patient is returning a missed phone call. Her work number is [phone] if you call during the day time.

## 2014-07-03 ENCOUNTER — Other Ambulatory Visit: Payer: Self-pay | Admitting: Physician Assistant

## 2014-07-03 DIAGNOSIS — N3000 Acute cystitis without hematuria: Secondary | ICD-10-CM

## 2014-07-03 LAB — URINE CULTURE: Colony Count: >=100000

## 2014-07-03 MED ORDER — NITROFURANTOIN MONOHYD MACRO 100 MG PO CAPS: 100.0000 mg | ORAL_CAPSULE | Freq: Two times a day (BID) | ORAL | Status: DC

## 2014-07-03 NOTE — Progress Notes (Signed)
LMOM of that info

## 2014-07-03 NOTE — Telephone Encounter (Signed)
Patient called back. Per Dr. Ouida Sills patient needs to be seen for pain medicine. Patient states she will try to get here in the AM

## 2014-07-03 NOTE — Telephone Encounter (Signed)
Pt called back. Lab results given. Pt was put on Cipro and this is resistant. Can we please send her in something else. She is miserable. She also says the pyridium is not helping at all. Wants to know if there is something else we can send in for pain. Thanks

## 2014-12-15 ENCOUNTER — Ambulatory Visit (INDEPENDENT_AMBULATORY_CARE_PROVIDER_SITE_OTHER): Payer: Managed Care, Other (non HMO) | Admitting: Internal Medicine

## 2014-12-15 VITALS — BP 122/72 | HR 78 | Temp 98.0°F | Resp 17 | Ht 63.5 in | Wt 211.0 lb

## 2014-12-15 DIAGNOSIS — R319 Hematuria, unspecified: Secondary | ICD-10-CM | POA: Diagnosis not present

## 2014-12-15 DIAGNOSIS — N390 Urinary tract infection, site not specified: Secondary | ICD-10-CM | POA: Diagnosis not present

## 2014-12-15 DIAGNOSIS — N318 Other neuromuscular dysfunction of bladder: Secondary | ICD-10-CM

## 2014-12-15 DIAGNOSIS — R300 Dysuria: Secondary | ICD-10-CM

## 2014-12-15 DIAGNOSIS — R8281 Pyuria: Secondary | ICD-10-CM

## 2014-12-15 LAB — POCT URINALYSIS DIP (MANUAL ENTRY)
Bilirubin, UA: NEGATIVE
Glucose, UA: NEGATIVE
Ketones, POC UA: NEGATIVE
NITRITE UA: NEGATIVE
PH UA: 5.5
Spec Grav, UA: 1.015
Urobilinogen, UA: 0.2

## 2014-12-15 LAB — POC MICROSCOPIC URINALYSIS (UMFC): Mucus: ABSENT

## 2014-12-15 MED ORDER — SULFAMETHOXAZOLE-TRIMETHOPRIM 800-160 MG PO TABS: 1.0000 | ORAL_TABLET | Freq: Two times a day (BID) | ORAL | Status: DC

## 2014-12-15 MED ORDER — PHENAZOPYRIDINE HCL 200 MG PO TABS: 200.0000 mg | ORAL_TABLET | Freq: Three times a day (TID) | ORAL | Status: DC | PRN

## 2014-12-15 NOTE — Progress Notes (Signed)
Subjective:    Patient ID: Kimberly Griffith, female    DOB: 16-Nov-1955, 59 y.o.   MRN: [id number] This chart was scribed for Tami Lin, MD by Zola Button, Medical Scribe. This patient was seen in Room 12 and the patient's care was started at 6:32 PM.   HPI HPI Comments: Kimberly Griffith is a 59 y.o. female with a history of prior UTIs who presents to the [facility] complaining of urinary frequency that started 3 days ago. Patient reports having associated back pain and did have some bleeding. She does not think she has had a fever. Patient has had prior UTIs, but they occur less than 3 times a year. Her last UTI was 5 months ago. Patient denies urinary symptoms normally and cannot associate UTIs with sexual intercourse.  Gravida 6 No incontinence ? Incomplete void but no nocturia   Healthy and no medications  Review of Systems Noncontributory    Objective:   Physical Exam  Constitutional: She is oriented to person, place, and time. She appears well-developed and well-nourished. No distress.  HENT:  Head: Normocephalic and atraumatic.  Mouth/Throat: Oropharynx is clear and moist. No oropharyngeal exudate.  Eyes: Pupils are equal, round, and reactive to light.  Neck: Neck supple.  Cardiovascular: Normal rate.   Pulmonary/Chest: Effort normal.  Abdominal:  No abdominal or flank tenderness  Musculoskeletal: She exhibits no edema.  Neurological: She is alert and oriented to person, place, and time. No cranial nerve deficit.  Skin: Skin is warm and dry. No rash noted.  Psychiatric: She has a normal mood and affect. Her behavior is normal.  Nursing note and vitals reviewed.  Results for orders placed or performed in visit on 12/15/14  POCT Microscopic Urinalysis ([facility])  Result Value Ref Range   WBC,UR,HPF,POC Many (A) None WBC/hpf   RBC,UR,HPF,POC Few (A) None RBC/hpf   Bacteria Moderate (A) None, Too numerous to count   Mucus Absent Absent   Epithelial Cells,  UR Per Microscopy Few (A) None, Too numerous to count cells/hpf  POCT urinalysis dipstick  Result Value Ref Range   Color, UA yellow yellow   Clarity, UA cloudy (A) clear   Glucose, UA negative negative   Bilirubin, UA negative negative   Ketones, POC UA negative negative   Spec Grav, UA 1.015    Blood, UA moderate (A) negative   pH, UA 5.5    Protein Ur, POC =30 (A) negative   Urobilinogen, UA 0.2    Nitrite, UA Negative Negative   Leukocytes, UA large (3+) (A) Negative          Assessment & Plan:  Dysuria - Plan: POCT Microscopic Urinalysis ([facility]), POCT urinalysis dipstick, phenazopyridine (PYRIDIUM) 200 MG tablet, CANCELED: Urine culture  Pyuria - Plan: phenazopyridine (PYRIDIUM) 200 MG tablet, CANCELED: Urine culture  Blood in urine - Plan: phenazopyridine (PYRIDIUM) 200 MG tablet  Frequency-urgency syndrome - Plan: phenazopyridine (PYRIDIUM) 200 MG tablet  Meds ordered this encounter  Medications  . sulfamethoxazole-trimethoprim (BACTRIM DS,SEPTRA DS) 800-160 MG tablet    Sig: Take 1 tablet by mouth 2 (two) times daily.    Dispense:  20 tablet    Refill:  0  . phenazopyridine (PYRIDIUM) 200 MG tablet    Sig: Take 1 tablet (200 mg total) by mouth 3 (three) times daily as needed for pain.    Dispense:  10 tablet    Refill:  0   Based on cult 5/16 she should be sens to sxt but not  to quinolones We need to watch for recurrent infections to consider uterine prolapse  By signing my name below, I, Zola Button, attest that this documentation has been prepared under the direction and in the presence of Tami Lin, MD.  Electronically Signed: Zola Button, Medical Scribe. 12/15/2014. 6:32 PM.  Addendum-there was not enough urine to send culture and she was gone before this was discovered

## 2015-02-21 DIAGNOSIS — C801 Malignant (primary) neoplasm, unspecified: Secondary | ICD-10-CM

## 2015-02-21 HISTORY — DX: Malignant (primary) neoplasm, unspecified: C80.1

## 2015-03-09 ENCOUNTER — Ambulatory Visit (INDEPENDENT_AMBULATORY_CARE_PROVIDER_SITE_OTHER): Payer: Managed Care, Other (non HMO) | Admitting: Urgent Care

## 2015-03-09 VITALS — BP 120/72 | HR 79 | Temp 98.2°F | Resp 18 | Ht 64.5 in | Wt 209.8 lb

## 2015-03-09 DIAGNOSIS — N3090 Cystitis, unspecified without hematuria: Secondary | ICD-10-CM | POA: Diagnosis not present

## 2015-03-09 DIAGNOSIS — J029 Acute pharyngitis, unspecified: Secondary | ICD-10-CM | POA: Diagnosis not present

## 2015-03-09 DIAGNOSIS — J329 Chronic sinusitis, unspecified: Secondary | ICD-10-CM

## 2015-03-09 DIAGNOSIS — R300 Dysuria: Secondary | ICD-10-CM | POA: Diagnosis not present

## 2015-03-09 DIAGNOSIS — N318 Other neuromuscular dysfunction of bladder: Secondary | ICD-10-CM | POA: Diagnosis not present

## 2015-03-09 DIAGNOSIS — R0982 Postnasal drip: Secondary | ICD-10-CM

## 2015-03-09 DIAGNOSIS — N390 Urinary tract infection, site not specified: Secondary | ICD-10-CM

## 2015-03-09 DIAGNOSIS — R319 Hematuria, unspecified: Secondary | ICD-10-CM | POA: Diagnosis not present

## 2015-03-09 LAB — POCT URINALYSIS DIP (MANUAL ENTRY)
BILIRUBIN UA: NEGATIVE
Bilirubin, UA: NEGATIVE
Glucose, UA: NEGATIVE
Nitrite, UA: NEGATIVE
Protein Ur, POC: NEGATIVE
Spec Grav, UA: 1.015
Urobilinogen, UA: 0.2
pH, UA: 7

## 2015-03-09 LAB — POC MICROSCOPIC URINALYSIS (UMFC): MUCUS RE: ABSENT

## 2015-03-09 MED ORDER — PHENAZOPYRIDINE HCL 200 MG PO TABS: 200.0000 mg | ORAL_TABLET | Freq: Three times a day (TID) | ORAL | Status: DC | PRN

## 2015-03-09 MED ORDER — AMOXICILLIN-POT CLAVULANATE 875-125 MG PO TABS: 1.0000 | ORAL_TABLET | Freq: Two times a day (BID) | ORAL | Status: DC

## 2015-03-09 NOTE — Patient Instructions (Signed)

## 2015-03-09 NOTE — Progress Notes (Signed)
MRN: [id number] DOB: 05/17/1955  Subjective:   Kimberly Griffith is a 60 y.o. female presenting for chief complaint of Sore Throat and Urinary Tract Infection  Reports 5 day history dysuria, burning sensation, urinary frequency, malodorous cloudy urine, hematuria, pelvic pain, low back pain, chills. Has tried Cystex, Tylenol with only temporary relief. Denies fever, abdominal pain, n/v, genital rashes, vaginal irritation. Has a history of UTIs, renal stone x1. Denies history of pyelonephritis.  She has also had 1 day history of sore throat, cough, lymph node pain. Husband has been ill, still working through cough. Has had a history of strep throat. Denies chest pain, shob, sinus pain, sinus congestion.  Makena currently has no medications in their medication list. Also has No Known Allergies.  [name]  has a past medical history of Allergy. Also  has no past surgical history on file.  Objective:   Vitals: BP 120/72 mmHg  Pulse 79  Temp(Src) 98.2 F (36.8 C) (Oral)  Resp 18  Ht 5' 4.5" (1.638 m)  Wt 209 lb 12.8 oz (95.165 kg)  BMI 35.47 kg/m2  SpO2 96%  Physical Exam  Constitutional: She is oriented to person, place, and time. She appears well-developed and well-nourished.  HENT:  TM's flat bilaterally, no effusions or erythema. Nasal turbinates pink and moist. No sinus tenderness. Significant postnasal drip present, without oropharyngeal exudates, erythema or abscesses.  Eyes: Right eye exhibits no discharge. Left eye exhibits no discharge. No scleral icterus.  Neck: Normal range of motion. Neck supple.  Cardiovascular: Normal rate, regular rhythm and intact distal pulses.  Exam reveals no gallop and no friction rub.   No murmur heard. Pulmonary/Chest: No respiratory distress. She has no wheezes. She has no rales.  Abdominal: Soft. Bowel sounds are normal. She exhibits no distension and no mass. There is tenderness (pelvic).  Lymphadenopathy:    She has cervical adenopathy  (left-sided, anterior).  Neurological: She is alert and oriented to person, place, and time.  Skin: Skin is warm and dry. No rash noted. No erythema. No pallor.    Results for orders placed or performed in visit on 03/09/15 (from the past 24 hour(s))  POCT urinalysis dipstick     Status: Abnormal   Collection Time: 03/09/15  8:26 AM  Result Value Ref Range   Color, UA light yellow (A) yellow   Clarity, UA cloudy (A) clear   Glucose, UA negative negative   Bilirubin, UA negative negative   Ketones, POC UA negative negative   Spec Grav, UA 1.015    Blood, UA moderate (A) negative   pH, UA 7.0    Protein Ur, POC negative negative   Urobilinogen, UA 0.2    Nitrite, UA Negative Negative   Leukocytes, UA large (3+) (A) Negative  POCT Microscopic Urinalysis ([facility])     Status: Abnormal   Collection Time: 03/09/15  8:26 AM  Result Value Ref Range   WBC,UR,HPF,POC Too numerous to count  (A) None WBC/hpf   RBC,UR,HPF,POC None None RBC/hpf   Bacteria Moderate (A) None, Too numerous to count   Mucus Absent Absent   Epithelial Cells, UR Per Microscopy Few (A) None, Too numerous to count cells/hpf   Assessment and Plan :   1. Cystitis 2. Hematuria 3. Dysuria - Start Augmentin based off of urine culture from 06/2014. Urine culture from today pending. Azo for dysuria, advised aggressive hydration. RTC in 3 days if urine culture has not resulted and patient is not experiencing relief.  4. Sore throat  5. Post-nasal drainage - May be undergoing viral syndrome, recommended supportive care. Counseled patient that antibiotic is for UTI not strep throat. Call if not better in 1 week.  Jaynee Eagles, PA-C [facility] [phone] 03/09/2015 8:23 AM

## 2015-03-11 ENCOUNTER — Encounter: Payer: Self-pay | Admitting: Urgent Care

## 2015-03-11 LAB — URINE CULTURE

## 2015-04-14 ENCOUNTER — Ambulatory Visit (INDEPENDENT_AMBULATORY_CARE_PROVIDER_SITE_OTHER): Payer: Managed Care, Other (non HMO) | Admitting: Family Medicine

## 2015-04-14 VITALS — BP 149/82 | HR 64 | Temp 97.8°F | Resp 18 | Ht 63.75 in | Wt 205.0 lb

## 2015-04-14 DIAGNOSIS — N3000 Acute cystitis without hematuria: Secondary | ICD-10-CM

## 2015-04-14 DIAGNOSIS — R300 Dysuria: Secondary | ICD-10-CM

## 2015-04-14 LAB — POCT URINALYSIS DIP (MANUAL ENTRY)
BILIRUBIN UA: NEGATIVE
GLUCOSE UA: NEGATIVE
Ketones, POC UA: NEGATIVE
NITRITE UA: POSITIVE — AB
Protein Ur, POC: NEGATIVE
Spec Grav, UA: <=1.005
Urobilinogen, UA: 0.2
pH, UA: 5

## 2015-04-14 LAB — POC MICROSCOPIC URINALYSIS (UMFC): MUCUS RE: ABSENT

## 2015-04-14 MED ORDER — NITROFURANTOIN MONOHYD MACRO 100 MG PO CAPS: 100.0000 mg | ORAL_CAPSULE | Freq: Every day | ORAL | Status: DC

## 2015-04-14 MED ORDER — FLUCONAZOLE 150 MG PO TABS: 150.0000 mg | ORAL_TABLET | Freq: Once | ORAL | Status: DC

## 2015-04-14 MED ORDER — SULFAMETHOXAZOLE-TRIMETHOPRIM 800-160 MG PO TABS: 1.0000 | ORAL_TABLET | Freq: Two times a day (BID) | ORAL | Status: DC

## 2015-04-14 NOTE — Patient Instructions (Signed)
You definitely need a pelvic exam asap to try to ensure that there is no underlying cause for the increased frequency of bladder infections.  You are welcome to return here for that or make an appointment with any family practice doctor or gynecologist.  There are many excellent groups in town [facility] have been very impressive. [facility] in the same building is also great.  Pyelonephritis, Adult Pyelonephritis is a kidney infection. The kidneys are the organs that filter a person's blood and move waste out of the bloodstream and into the urine. Urine passes from the kidneys, through the ureters, and into the bladder. There are two main types of pyelonephritis:  Infections that come on quickly without any warning (acute pyelonephritis).  Infections that last for a long period of time (chronic pyelonephritis). In most cases, the infection clears up with treatment and does not cause further problems. More severe infections or chronic infections can sometimes spread to the bloodstream or lead to other problems with the kidneys. CAUSES This condition is usually caused by:  Bacteria traveling from the bladder to the kidney through infected urine. The urine in the bladder can become infected with bacteria from:  Bladder infection (cystitis).  Inflammation of the prostate gland (prostatitis).  Sexual intercourse, in females.  Bacteria traveling from the bloodstream to the kidney. RISK FACTORS This condition is more likely to develop in:  Pregnant women.  Older people.  People who have diabetes.  People who have kidney stones or bladder stones.  People who have other abnormalities of the kidney or ureter.  People who have a catheter placed in the bladder.  People who have cancer.  People who are sexually active.  Women who use spermicides.  People who have had a prior urinary tract infection. SYMPTOMS Symptoms of this condition  include:  Frequent urination.  Strong or persistent urge to urinate.  Burning or stinging when urinating.  Abdominal pain.  Back pain.  Pain in the side or flank area.  Fever.  Chills.  Blood in the urine, or dark urine.  Nausea.  Vomiting. DIAGNOSIS This condition may be diagnosed based on:  Medical history and physical exam.  Urine tests.  Blood tests. You may also have imaging tests of the kidneys, such as an ultrasound or CT scan. TREATMENT Treatment for this condition may depend on the severity of the infection.  If the infection is mild and is found early, you may be treated with antibiotic medicines taken by mouth. You will need to drink fluids to remain hydrated.  If the infection is more severe, you may need to stay in the hospital and receive antibiotics given directly into a vein through an IV tube. You may also need to receive fluids through an IV tube if you are not able to remain hydrated. After your hospital stay, you may need to take oral antibiotics for a period of time. Other treatments may be required, depending on the cause of the infection. HOME CARE INSTRUCTIONS Medicines  Take over-the-counter and prescription medicines only as told by your health care provider.  If you were prescribed an antibiotic medicine, take it as told by your health care provider. Do not stop taking the antibiotic even if you start to feel better. General Instructions  Drink enough fluid to keep your urine clear or pale yellow.  Avoid caffeine, tea, and carbonated beverages. They tend to irritate the bladder.  Urinate often. Avoid holding in urine for long periods of  time.  Urinate before and after sex.  After a bowel movement, women should cleanse from front to back. Use each tissue only once.  Keep all follow-up visits as told by your health care provider. This is important. SEEK MEDICAL CARE IF:  Your symptoms do not get better after 2 days of  treatment.  Your symptoms get worse.  You have a fever. SEEK IMMEDIATE MEDICAL CARE IF:  You are unable to take your antibiotics or fluids.  You have shaking chills.  You vomit.  You have severe flank or back pain.  You have extreme weakness or fainting.   This information is not intended to replace advice given to you by your health care provider. Make sure you discuss any questions you have with your health care provider.   Document Released: 02/06/2005 Document Revised: 10/28/2014 Document Reviewed: 06/01/2014 Elsevier Interactive Patient Education [employer].

## 2015-04-14 NOTE — Progress Notes (Signed)
Subjective:    Patient ID: Kimberly Griffith, female    DOB: 1955/06/01, 60 y.o.   MRN: [id number] Chief Complaint  Patient presents with  . Urinary Tract Infection    comes and goes x 1 month  . Urinary Frequency  . Urine odor  . Dysuria    HPI  Has had UTIs periodically throughout life.  She had cipro called in on 2/9 despite bid x 3d which worked while she has been on it.  No fever/chills, no n/v.  + low abd/pelvic discomfort.  She has discomfort when she stops voiding. Her urine looks cloudy, with increased frequency and urgency. + Nocturia of up to 4x. 6 kids, no pap smear or pelvic exam.  She did have some vaginal bleeding - just a little spotting but stopped sev wks ago - very minor and brief.  Does have a h/o diarrhea and is usually.  No exposures as far as water and swimming.  Past Medical History  Diagnosis Date  . Allergy     kiwi   History reviewed. No pertinent past surgical history. Current Outpatient Prescriptions on File Prior to Visit  Medication Sig Dispense Refill  . phenazopyridine (PYRIDIUM) 200 MG tablet Take 1 tablet (200 mg total) by mouth 3 (three) times daily as needed for pain. 15 tablet 0  . amoxicillin-clavulanate (AUGMENTIN) 875-125 MG tablet Take 1 tablet by mouth 2 (two) times daily. (Patient not taking: Reported on 04/14/2015) 20 tablet 0   No current facility-administered medications on file prior to visit.   No Known Allergies Family History  Problem Relation Age of Onset  . Hypertension Mother   . Hypertension Maternal Grandmother   . Heart disease Paternal Grandfather    Social History   Social History  . Marital Status: Married    Spouse Name: N/A  . Number of Children: N/A  . Years of Education: N/A   Social History Main Topics  . Smoking status: Former Research scientist (life sciences)  . Smokeless tobacco: Never Used  . Alcohol Use: No  . Drug Use: No  . Sexual Activity: Not Asked   Other Topics Concern  . None   Social History Narrative     Review of Systems  Constitutional: Negative for fever, chills, diaphoresis, activity change, appetite change, fatigue and unexpected weight change.  Gastrointestinal: Positive for abdominal pain and diarrhea. Negative for nausea, vomiting, constipation, blood in stool, abdominal distention and anal bleeding.  Endocrine: Positive for polyuria.  Genitourinary: Positive for dysuria, urgency, frequency, vaginal bleeding, vaginal discharge, menstrual problem and pelvic pain. Negative for hematuria, flank pain, decreased urine volume, enuresis, difficulty urinating, genital sores, vaginal pain and dyspareunia.  Musculoskeletal: Negative for gait problem.  Skin: Negative for rash.  Hematological: Negative for adenopathy.  Psychiatric/Behavioral: The patient is not nervous/anxious.        Objective:  BP 149/82 mmHg  Pulse 64  Temp(Src) 97.8 F (36.6 C) (Oral)  Resp 18  Ht 5' 3.75" (1.619 m)  Wt 205 lb (92.987 kg)  BMI 35.48 kg/m2  SpO2 98%   Physical Exam  Constitutional: She is oriented to person, place, and time. She appears well-developed and well-nourished. No distress.  HENT:  Head: Normocephalic and atraumatic.  Cardiovascular: Normal rate, regular rhythm, normal heart sounds and intact distal pulses.   Pulmonary/Chest: Effort normal and breath sounds normal.  Abdominal: Soft. Bowel sounds are normal. She exhibits no distension. There is no hepatosplenomegaly. There is no tenderness. There is no rebound, no guarding and no CVA tenderness.  Neurological: She is alert and oriented to person, place, and time.  Skin: Skin is warm and dry. She is not diaphoretic.  Psychiatric: She has a normal mood and affect. Her behavior is normal.     Results for orders placed or performed in visit on 04/14/15  Urine culture  Result Value Ref Range   Culture ESCHERICHIA COLI    Colony Count >=100,000 COLONIES/ML    Organism ID, Bacteria ESCHERICHIA COLI       Susceptibility   Escherichia  coli -  (no method available)    AMPICILLIN 4 Sensitive     AMOX/CLAVULANIC <=2 Sensitive     AMPICILLIN/SULBACTAM <=2 Sensitive     PIP/TAZO <=4 Sensitive     IMIPENEM <=0.25 Sensitive     CEFAZOLIN <=4 Not Reportable     CEFTRIAXONE <=1 Sensitive     CEFTAZIDIME <=1 Sensitive     CEFEPIME <=1 Sensitive     GENTAMICIN <=1 Sensitive     TOBRAMYCIN <=1 Sensitive     CIPROFLOXACIN >=4 Resistant     LEVOFLOXACIN >=8 Resistant     NITROFURANTOIN <=16 Sensitive     TRIMETH/SULFA* >=320 Resistant      * NR=NOT REPORTABLE,SEE COMMENTORAL therapy:A cefazolin MIC of <32 predicts susceptibility to the oral agents cefaclor,cefdinir,cefpodoxime,cefprozil,cefuroxime,cephalexin,and loracarbef when used for therapy of uncomplicated UTIs due to E.coli,K.pneumomiae,and P.mirabilis. PARENTERAL therapy: A cefazolinMIC of >8 indicates resistance to parenteralcefazolin. An alternate test method must beperformed to confirm susceptibility to parenteralcefazolin.  POCT urinalysis dipstick  Result Value Ref Range   Color, UA yellow yellow   Clarity, UA cloudy (A) clear   Glucose, UA negative negative   Bilirubin, UA negative negative   Ketones, POC UA negative negative   Spec Grav, UA <=1.005    Blood, UA trace-lysed (A) negative   pH, UA 5.0    Protein Ur, POC negative negative   Urobilinogen, UA 0.2    Nitrite, UA Positive (A) Negative   Leukocytes, UA large (3+) (A) Negative  POCT Microscopic Urinalysis ([facility])  Result Value Ref Range   WBC,UR,HPF,POC Too numerous to count  (A) None WBC/hpf   RBC,UR,HPF,POC Few (A) None RBC/hpf   Bacteria Many (A) None, Too numerous to count   Mucus Absent Absent   Epithelial Cells, UR Per Microscopy Few (A) None, Too numerous to count cells/hpf       Assessment & Plan:   1. Dysuria   2. Acute cystitis without hematuria    Repeatedly advised that she needs pelvic exam asap due to new onset of recurrent UTIs and some vaginal spotting in the past mo but pt  absolutely refuses today - doesn't have time - very long wait in office and leaving on vacation in 2d. I am concerned about her pelvic and GU ssxs.  she has had so much resistance and failed many of the antibiotics. Recommended ceftriaxone treatment but pt has a high deductible ins and so cannot afford this. Therefore, will rec treating acutely with bactrim (despite h/o some resistance) then cover with macrobid for the next mo to try to break the recurrent UTI cycle. RTC or f/u with PCP or gyn for pelvic exam ASAP! Pt understands my concern due to some post-menopausal vaginal spotting  Orders Placed This Encounter  Procedures  . Urine culture  . POCT urinalysis dipstick  . POCT Microscopic Urinalysis ([facility])    Meds ordered this encounter  Medications  . sulfamethoxazole-trimethoprim (BACTRIM DS,SEPTRA DS) 800-160 MG tablet    Sig: Take 1 tablet  by mouth 2 (two) times daily.    Dispense:  14 tablet    Refill:  0  . nitrofurantoin, macrocrystal-monohydrate, (MACROBID) 100 MG capsule    Sig: Take 1 capsule (100 mg total) by mouth at bedtime.    Dispense:  30 capsule    Refill:  0  . fluconazole (DIFLUCAN) 150 MG tablet    Sig: Take 1 tablet (150 mg total) by mouth once. If needed for yeast infection.    Dispense:  1 tablet    Refill:  1     Delman Cheadle, MD MPH

## 2015-04-17 LAB — URINE CULTURE: Colony Count: >=100000

## 2015-06-07 ENCOUNTER — Encounter: Payer: Managed Care, Other (non HMO) | Admitting: Obstetrics and Gynecology

## 2015-06-07 ENCOUNTER — Ambulatory Visit (INDEPENDENT_AMBULATORY_CARE_PROVIDER_SITE_OTHER): Payer: Managed Care, Other (non HMO) | Admitting: Obstetrics and Gynecology

## 2015-06-07 ENCOUNTER — Encounter: Payer: Self-pay | Admitting: Obstetrics and Gynecology

## 2015-06-07 ENCOUNTER — Other Ambulatory Visit: Payer: Self-pay | Admitting: Obstetrics and Gynecology

## 2015-06-07 VITALS — BP 140/88 | HR 80 | Resp 16 | Ht 63.0 in | Wt 205.6 lb

## 2015-06-07 DIAGNOSIS — Z1231 Encounter for screening mammogram for malignant neoplasm of breast: Secondary | ICD-10-CM

## 2015-06-07 DIAGNOSIS — Z01419 Encounter for gynecological examination (general) (routine) without abnormal findings: Secondary | ICD-10-CM

## 2015-06-07 DIAGNOSIS — R319 Hematuria, unspecified: Secondary | ICD-10-CM | POA: Diagnosis not present

## 2015-06-07 DIAGNOSIS — N950 Postmenopausal bleeding: Secondary | ICD-10-CM

## 2015-06-07 DIAGNOSIS — N390 Urinary tract infection, site not specified: Secondary | ICD-10-CM | POA: Diagnosis not present

## 2015-06-07 LAB — POCT URINALYSIS DIPSTICK
Bilirubin, UA: NEGATIVE
Glucose, UA: NEGATIVE
KETONES UA: NEGATIVE
LEUKOCYTES UA: NEGATIVE
Nitrite, UA: NEGATIVE
PH UA: 5
PROTEIN UA: NEGATIVE
UROBILINOGEN UA: NEGATIVE

## 2015-06-07 NOTE — Progress Notes (Signed)
Scheduled patient while in office for bilateral screening mammogram at the [facility] on 06/18/2015 at 3 pm. She is agreeable to date and time.

## 2015-06-07 NOTE — Progress Notes (Signed)
Patient ID: Kimberly Griffith, female   DOB: August 13, 1955, 60 y.o.   MRN: [id number] GYNECOLOGY  VISIT   HPI: 60 y.o.   Married  Caucasian  female   [phone] with Patient's last menstrual period was 02/21/2011 (approximate).   here annual exam and for frequent urinary tract infections for the past six months.    No regular GYN care for 20 years.   UTI 03/09/15 - 04/04/15.   E Coli.  Also treated for UTI in October 2016 by a positive urinalysis and not culture.  Last infection treated with Bactrim and then Macrobid one daily for one month.  Has now run out.   Feels pressure vaginally. This weekend feels warmpth when she voids.  No blood in the urine.  No fevers, shakes, chills, nausea or vomiting.   Does not think infections are post coital.   Not on any HRT.   Also has had history of postmenopausal bleeding recently.  Has had 3 - 4 times in the last year.  States she bleeds if she is under stress.  States she has had bleeding with the last UTI infection.   Dr. Marijean Heath - PCP  Urine - WBCs today.  Not symptomatic.   GYNECOLOGIC HISTORY: Patient's last menstrual period was 02/21/2011 (approximate). Contraception:  Postmenopausal Menopausal hormone therapy:  none Last mammogram:  20 years ago Last pap smear:   24 years ago per patient:normal        OB History    Gravida Para Term Preterm AB TAB SAB Ectopic Multiple Living   7 6   1  1   6          There are no active problems to display for this patient.   Past Medical History  Diagnosis Date  . Allergy     kiwi  . History of kidney stones 2008  . Blood transfusion without reported diagnosis 1993    Dx'd with E.Coli    Past Surgical History  Procedure Laterality Date  . Tonsillectomy and adenoidectomy    . Wisdom tooth extraction      Current Outpatient Prescriptions  Medication Sig Dispense Refill  . Probiotic Product (PROBIOTIC DAILY PO) Take 1 tablet by mouth daily.     No current facility-administered medications  for this visit.     ALLERGIES: Review of patient's allergies indicates no known allergies.  Family History  Problem Relation Age of Onset  . Hypertension Mother   . Hypertension Maternal Grandmother   . Stroke Maternal Grandmother   . Heart disease Paternal Grandfather   . Other Father     dec unknown cause age 41  . Hypertension Father     Social History   Social History  . Marital Status: Married    Spouse Name: N/A  . Number of Children: N/A  . Years of Education: N/A   Occupational History  . Not on file.   Social History Main Topics  . Smoking status: Former Smoker    Types: Cigarettes  . Smokeless tobacco: Former Systems developer    Quit date: 02/21/1984  . Alcohol Use: 0.0 oz/week    0 Standard drinks or equivalent per week     Comment: one drink/month  . Drug Use: No  . Sexual Activity:    Partners: Male    Birth Control/ Protection: Post-menopausal   Other Topics Concern  . Not on file   Social History Narrative    ROS:  Pertinent items are noted in HPI.  PHYSICAL EXAMINATION:  BP 140/88 mmHg  Pulse 80  Resp 16  Ht 5\' 3"  (1.6 m)  Wt 205 lb 9.6 oz (93.26 kg)  BMI 36.43 kg/m2  LMP 02/21/2011 (Approximate)    General appearance: alert, cooperative and appears stated age Head: Normocephalic, without obvious abnormality, atraumatic Neck: no adenopathy, supple, symmetrical, trachea midline and thyroid normal to inspection and palpation Lungs: clear to auscultation bilaterally Breasts: normal appearance, no masses or tenderness, Inspection negative, No nipple retraction or dimpling, No nipple discharge or bleeding, No axillary or supraclavicular adenopathy Heart: regular rate and rhythm Abdomen: incision(s):No., ____________  soft, non-tender, no masses,  no organomegaly Extremities: extremities normal, atraumatic, no cyanosis or edema Skin: Skin color, texture, turgor normal. No rashes or lesions Lymph nodes: Cervical, supraclavicular, and axillary nodes  normal. No abnormal inguinal nodes palpated Neurologic: Grossly normal  Pelvic: External genitalia:  no lesions              Urethra:  normal appearing urethra with no masses, tenderness or lesions              Bartholins and Skenes: normal                 Vagina: normal appearing vagina with normal color and discharge, no lesions.  Signs of atrophy noted and speculum exam somewhat uncomfortable.              Cervix: no lesions              Pap taken: Yes.   Bimanual Exam:  Uterus:  normal size, contour, position, consistency, mobility, non-tender              Adnexa: normal adnexa and no mass, fullness, tenderness              Rectal exam: Yes.  .  Confirms.              Anus:  normal sphincter tone, no lesions  Chaperone was present for exam.  ASSESSMENT  GYN annual exam. Postmenopausal bleeding.  Recurrent UTIs.  History of renal stones.  Absent from GYN care or 20 years.   PLAN  Pap and HR HPV taken.  Appointment will be made for mammogram. Will check urine micro and culture.  Discussion of recurrent UTIs and possible etiologies.   I have discussed possible vaginal estrogen treatment for recurrent infections but only after completion of mammogram and evaluation for postmenopausal bleeding.  We also discussed referral to urology for recurrent UTIs.    Discussion of postmenopausal bleeding and the etiologies including but not limited to polyps, hyperplasia, cancer, fibroids, and atrophy.  She will return for a pelvic ultrasound and understands that she may need endometrial biopsy or further evaluation depending on the ultrasound findings.  Routine labs through Dr. Raul Del office.    An After Visit Summary was printed and given to the patient.  _10_____ minutes face to face time of which over 50% was spent in counseling regarding postmenopausal bleeding and recurrent UTIs.

## 2015-06-07 NOTE — Patient Instructions (Signed)

## 2015-06-08 LAB — URINALYSIS, MICROSCOPIC ONLY
BACTERIA UA: NONE SEEN [HPF]
CRYSTALS: NONE SEEN [HPF]
Casts: NONE SEEN [LPF]
RBC / HPF: NONE SEEN RBC/HPF (ref <=2)
Squamous Epithelial / LPF: NONE SEEN [HPF] (ref <=5)
WBC, UA: NONE SEEN WBC/HPF (ref <=5)
YEAST: NONE SEEN [HPF]

## 2015-06-08 LAB — URINE CULTURE

## 2015-06-10 ENCOUNTER — Telehealth: Payer: Self-pay | Admitting: Obstetrics and Gynecology

## 2015-06-10 ENCOUNTER — Telehealth: Payer: Self-pay

## 2015-06-10 DIAGNOSIS — N950 Postmenopausal bleeding: Secondary | ICD-10-CM

## 2015-06-10 LAB — IPS PAP TEST WITH HPV

## 2015-06-10 NOTE — Telephone Encounter (Signed)
Call patient to discuss benefits for a procedure. Left Voicemail requesting a call.

## 2015-06-10 NOTE — Telephone Encounter (Signed)
-----   Message from Nunzio Cobbs, MD sent at 06/09/2015  9:29 AM EDT ----- Please inform patient of negative urine micro and culture.   Cc- Marisa Sprinkles

## 2015-06-10 NOTE — Telephone Encounter (Signed)
Left message to call Kaitlyn at [phone]. 

## 2015-06-11 NOTE — Telephone Encounter (Signed)
Phone call to discuss pap results. No details left.  Left message to return my call.   Pap is showing atypia and negative HR HPV.  Inflammatory changes are also present.  Laboratory is recommending endometrial biopsy to further evaluation of potential abnormal endometrial cells.  EMB will need to be added to procedure.   I strongly recommend that the patient proceed with the pelvic ultrasound and the endometrial biopsy.

## 2015-06-14 NOTE — Telephone Encounter (Signed)
Spoke with patient. Advised of message as seen below from [place]. She is agreeable and verbalizes understanding. Patient has been given benefits for PUS, but not for endometrial biopsy at this time. Advised her benefits will be checked again with adding an endometrial biopsy and she will be contacted to discuss these benefits. Then we will proceed with scheduling. She is agreeable and verbalizes understanding.  Cc: Theresia Lo for precert  Dr.Silva, what diagnosis will need to be used for the endometrial biopsy order?

## 2015-06-14 NOTE — Telephone Encounter (Signed)
Call to patient to relay ultrasound benefit. Patient understood but hesitant to schedule. Patient asked about other calls she has received. Transferred to [facility] to relay reasons for lab call and information below.

## 2015-06-14 NOTE — Telephone Encounter (Signed)
The diagnosis for the endometrial biopsy is postmenopausal bleeding.

## 2015-06-14 NOTE — Telephone Encounter (Signed)
Spoke with patient. Advised of results as seen below. Patient is agreeable.  Routing to provider for final review. Patient agreeable to disposition. Will close encounter.

## 2015-06-15 NOTE — Telephone Encounter (Signed)
Order for EMB placed for precert. Routing to [employer].

## 2015-06-16 NOTE — Telephone Encounter (Signed)
Called patient to review benefits for endometrial biopsy. Left voicemail to call back and review.

## 2015-06-18 ENCOUNTER — Ambulatory Visit
Admission: RE | Admit: 2015-06-18 | Discharge: 2015-06-18 | Disposition: A | Discharge status: Home / Self Care | Payer: Managed Care, Other (non HMO) | Source: Ambulatory Visit | Attending: Obstetrics and Gynecology | Admitting: Obstetrics and Gynecology

## 2015-06-18 DIAGNOSIS — Z1231 Encounter for screening mammogram for malignant neoplasm of breast: Secondary | ICD-10-CM

## 2015-06-18 NOTE — Telephone Encounter (Signed)
We can start the the endometrial biopsy.  Please keep her in the ultrasound queue.  Thanks.

## 2015-06-18 NOTE — Telephone Encounter (Signed)
Patient returned call. Spoke with patient regarding benefits for endometrial biopsy and ultrasound. Patient understood, but has declined ultrasound at this time. Patient request to proceed with scheduling endometrial biopsy,  patient scheduled 06/21/15 with Dr Quincy Simmonds, for this procedure. Patient aware of arrival date and time. Pateint aware of 72 hours cancellation policy with [id number] fee.   Routing to Dr Quincy Simmonds for final review.

## 2015-06-19 ENCOUNTER — Ambulatory Visit (INDEPENDENT_AMBULATORY_CARE_PROVIDER_SITE_OTHER): Payer: Managed Care, Other (non HMO) | Admitting: Family Medicine

## 2015-06-19 VITALS — BP 144/86 | HR 96 | Temp 98.6°F | Resp 16 | Ht 64.0 in | Wt 202.2 lb

## 2015-06-19 DIAGNOSIS — J029 Acute pharyngitis, unspecified: Secondary | ICD-10-CM | POA: Diagnosis not present

## 2015-06-19 DIAGNOSIS — R350 Frequency of micturition: Secondary | ICD-10-CM

## 2015-06-19 DIAGNOSIS — L821 Other seborrheic keratosis: Secondary | ICD-10-CM

## 2015-06-19 DIAGNOSIS — R197 Diarrhea, unspecified: Secondary | ICD-10-CM | POA: Diagnosis not present

## 2015-06-19 LAB — POCT URINALYSIS DIP (MANUAL ENTRY)
Bilirubin, UA: NEGATIVE
Glucose, UA: NEGATIVE
Ketones, POC UA: NEGATIVE
Nitrite, UA: POSITIVE — AB
Protein Ur, POC: 100 — AB
Spec Grav, UA: 1.02
Urobilinogen, UA: 0.2
pH, UA: 5.5

## 2015-06-19 LAB — POC MICROSCOPIC URINALYSIS (UMFC): MUCUS RE: ABSENT

## 2015-06-19 LAB — POCT RAPID STREP A (OFFICE): Rapid Strep A Screen: NEGATIVE

## 2015-06-19 MED ORDER — NITROFURANTOIN MONOHYD MACRO 100 MG PO CAPS: 100.0000 mg | ORAL_CAPSULE | Freq: Every day | ORAL | Status: DC

## 2015-06-19 MED ORDER — AMOXICILLIN-POT CLAVULANATE 875-125 MG PO TABS: 1.0000 | ORAL_TABLET | Freq: Two times a day (BID) | ORAL | Status: DC

## 2015-06-19 NOTE — Patient Instructions (Addendum)
IF you received an x-ray today, you will receive an invoice from [facility]. Please contact [facility] at [phone] with questions or concerns regarding your invoice.   IF you received labwork today, you will receive an invoice from [employer]. Please contact [facility] at [phone] with questions or concerns regarding your invoice.   Our billing staff will not be able to assist you with questions regarding bills from these companies.  You will be contacted with the lab results as soon as they are available. The fastest way to get your results is to activate your My Chart account. Instructions are located on the last page of this paperwork. If you have not heard from [place] regarding the results in 2 weeks, please contact this office.     Food Choices to Help Relieve Diarrhea, Adult When you have diarrhea, the foods you eat and your eating habits are very important. Choosing the right foods and drinks can help relieve diarrhea. Also, because diarrhea can last up to 7 days, you need to replace lost fluids and electrolytes (such as sodium, potassium, and chloride) in order to help prevent dehydration.  WHAT GENERAL GUIDELINES DO I NEED TO FOLLOW?  Slowly drink 1 cup (8 oz) of fluid for each episode of diarrhea. If you are getting enough fluid, your urine will be clear or pale yellow.  Eat starchy foods. Some good choices include white rice, white toast, pasta, low-fiber cereal, baked potatoes (without the skin), saltine crackers, and bagels.  Avoid large servings of any cooked vegetables.  Limit fruit to two servings per day. A serving is  cup or 1 small piece.  Choose foods with less than 2 g of fiber per serving.  Limit fats to less than 8 tsp (38 g) per day.  Avoid fried foods.  Eat foods that have probiotics in them. Probiotics can be found in certain dairy products.  Avoid foods and beverages that may increase the speed at which  food moves through the stomach and intestines (gastrointestinal tract). Things to avoid include:  High-fiber foods, such as dried fruit, raw fruits and vegetables, nuts, seeds, and whole grain foods.  Spicy foods and high-fat foods.  Foods and beverages sweetened with high-fructose corn syrup, honey, or sugar alcohols such as xylitol, sorbitol, and mannitol. WHAT FOODS ARE RECOMMENDED? Grains White rice. White, [place], or pita breads (fresh or toasted), including plain rolls, buns, or bagels. White pasta. Saltine, soda, or graham crackers. Pretzels. Low-fiber cereal. Cooked cereals made with water (such as cornmeal, farina, or cream cereals). Plain muffins. Matzo. Melba toast. Zwieback.  Vegetables Potatoes (without the skin). Strained tomato and vegetable juices. Most well-cooked and canned vegetables without seeds. Tender lettuce. Fruits Cooked or canned applesauce, apricots, cherries, fruit cocktail, grapefruit, peaches, pears, or plums. Fresh bananas, apples without skin, cherries, grapes, cantaloupe, grapefruit, peaches, oranges, or plums.  Meat and Other Protein Products Baked or boiled chicken. Eggs. Tofu. Fish. Seafood. Smooth peanut butter. Ground or well-cooked tender beef, ham, veal, lamb, pork, or poultry.  Dairy Plain yogurt, kefir, and unsweetened liquid yogurt. Lactose-free milk, buttermilk, or soy milk. Plain hard cheese. Beverages Sport drinks. Clear broths. Diluted fruit juices (except prune). Regular, caffeine-free sodas such as ginger ale. Water. Decaffeinated teas. Oral rehydration solutions. Sugar-free beverages not sweetened with sugar alcohols. Other Bouillon, broth, or soups made from recommended foods.  The items listed above may not be a complete list of recommended foods or beverages. Contact your dietitian for more options.  WHAT FOODS ARE NOT RECOMMENDED? Grains Whole grain, whole wheat, bran, or rye breads, rolls, pastas, crackers, and cereals. Wild or brown  rice. Cereals that contain more than 2 g of fiber per serving. Corn tortillas or taco shells. Cooked or dry oatmeal. Granola. Popcorn. Vegetables Raw vegetables. Cabbage, broccoli, Brussels sprouts, artichokes, baked beans, beet greens, corn, kale, legumes, peas, sweet potatoes, and yams. Potato skins. Cooked spinach and cabbage. Fruits Dried fruit, including raisins and dates. Raw fruits. Stewed or dried prunes. Fresh apples with skin, apricots, mangoes, pears, raspberries, and strawberries.  Meat and Other Protein Products Chunky peanut butter. Nuts and seeds. Beans and lentils. Berniece Salines.  Dairy High-fat cheeses. Milk, chocolate milk, and beverages made with milk, such as milk shakes. Cream. Ice cream. Sweets and Desserts Sweet rolls, doughnuts, and sweet breads. Pancakes and waffles. Fats and Oils Butter. Cream sauces. Margarine. Salad oils. Plain salad dressings. Olives. Avocados.  Beverages Caffeinated beverages (such as coffee, tea, soda, or energy drinks). Alcoholic beverages. Fruit juices with pulp. Prune juice. Soft drinks sweetened with high-fructose corn syrup or sugar alcohols. Other Coconut. Hot sauce. Chili powder. Mayonnaise. Gravy. Cream-based or milk-based soups.  The items listed above may not be a complete list of foods and beverages to avoid. Contact your dietitian for more information. WHAT SHOULD I DO IF I BECOME DEHYDRATED? Diarrhea can sometimes lead to dehydration. Signs of dehydration include dark urine and dry mouth and skin. If you think you are dehydrated, you should rehydrate with an oral rehydration solution. These solutions can be purchased at pharmacies, retail stores, or online.  Drink -1 cup (120-240 mL) of oral rehydration solution each time you have an episode of diarrhea. If drinking this amount makes your diarrhea worse, try drinking smaller amounts more often. For example, drink 1-3 tsp (5-15 mL) every 5-10 minutes.  A general rule for staying hydrated is to  drink 1-2 L of fluid per day. Talk to your health care provider about the specific amount you should be drinking each day. Drink enough fluids to keep your urine clear or pale yellow.   This information is not intended to replace advice given to you by your health care provider. Make sure you discuss any questions you have with your health care provider.   Document Released: 04/29/2003 Document Revised: 02/27/2014 Document Reviewed: 12/30/2012 Elsevier Interactive Patient Education 2016 Elsevier Inc.  Seborrheic Keratosis Seborrheic keratosis is a common, noncancerous (benign) skin growth. This condition causes waxy, rough, tan, brown, or black spots to appear on the skin. These skin growths can be flat or raised. CAUSES The cause of this condition is not known. RISK FACTORS This condition is more likely to develop in:  People who have a family history of seborrheic keratosis.  People who are 47 or older.  People who are pregnant.  People who have had estrogen replacement therapy. SYMPTOMS This condition often occurs on the face, chest, shoulders, back, or other areas. These growths:  Are usually painless, but may become irritated and itchy.  Can be yellow, brown, black, or other colors.  Are slightly raised or have a flat surface.  Are sometimes rough or wart-like in texture.  Are often waxy on the surface.  Are round or oval-shaped.  Sometimes look like they are "stuck on."  Often occur in groups, but may occur as a single growth. DIAGNOSIS This condition is diagnosed with a medical history and physical exam. A sample of the growth may be tested (skin biopsy). You may need to  see a skin specialist (dermatologist). TREATMENT Treatment is not usually needed for this condition, unless the growths are irritated or are often bleeding. You may also choose to have the growths removed if you do not like their appearance. Most commonly, these growths are treated with a procedure  in which liquid nitrogen is applied to "freeze" off the growth (cryosurgery). They may also be burned off with electricity or cut off. HOME CARE INSTRUCTIONS  Watch your growth for any changes.  Keep all follow-up visits as told by your health care provider. This is important.  Do not scratch or pick at the growth or growths. This can cause them to become irritated or infected. SEEK MEDICAL CARE IF:  You suddenly have many new growths.  Your growth bleeds, itches, or hurts.  Your growth suddenly becomes larger or changes color.   This information is not intended to replace advice given to you by your health care provider. Make sure you discuss any questions you have with your health care provider.   Document Released: 03/11/2010 Document Revised: 10/28/2014 Document Reviewed: 06/24/2014 Elsevier Interactive Patient Education [employer].

## 2015-06-19 NOTE — Progress Notes (Signed)
Subjective:  By signing my name below, I, [name], attest that this documentation has been prepared under the direction and in the presence of Delman Cheadle, MD.  Electronically Signed: Thea Alken, ED Scribe. 06/19/2015. 10:25 AM.   Patient ID: Kimberly Griffith, female    DOB: June 16, 1955, 60 y.o.   MRN: [id number]  HPI Chief Complaint  Patient presents with  . Urinary Retention  . Sinusitis  . Sore Throat  . Nevus    left hip    HPI Comments: Lorinda Saragoza is a 60 y.o. female who presents to the [facility] for a follow up. I saw her 2 months ago for complaints of recurrent bladder infection, symptoms consisting of pelvic pain and discomfort after voiding. She also had isolated episode of vaginal spotting at the beginning of February that self resolved. She had put on Cipro several time for bladder infection but at time urine culture came back resistant to bactrim and Cipro so she was transitioned to macrobid. She saw gynecology 2 weeks ago. She had mammogram done yesterday but has not yet had her transvaginal US. Pap smear returned showing a risk of endometrial neoplasia. Schedule endometrial biopsy on Monday but declined to schedule uterine US due to cost.   Pt complains of diarrhea that began last night. Pt reports having diarrhea after eating.  She  woke up last night with recurrent bladder symptoms of abdominal pain and urinary retention. She also presents with sore throat, hoarseness, fever of 100, nasal congestion, productive cough consisting of green phlegm for the past week. She has been using dayquil and nyquil as well as peroxide gargles. She reports sick contacts at work. Pt denies nausea and emesis.   She is concerned about a nevi on left hip. States this has grown over time.   There are no active problems to display for this patient.  Past Medical History  Diagnosis Date  . Allergy     kiwi  . History of kidney stones 2008  . Blood transfusion without  reported diagnosis 1993    Dx'd with E.Coli   Past Surgical History  Procedure Laterality Date  . Tonsillectomy and adenoidectomy    . Wisdom tooth extraction     No Known Allergies Prior to Admission medications   Medication Sig Start Date End Date Taking? Authorizing Provider  Probiotic Product (PROBIOTIC DAILY PO) Take 1 tablet by mouth daily.   Yes Historical Provider, MD   Social History   Social History  . Marital Status: Married    Spouse Name: N/A  . Number of Children: N/A  . Years of Education: N/A   Occupational History  . Not on file.   Social History Main Topics  . Smoking status: Former Smoker    Types: Cigarettes  . Smokeless tobacco: Former Systems developer    Quit date: 02/21/1984  . Alcohol Use: 0.0 oz/week    0 Standard drinks or equivalent per week     Comment: one drink/month  . Drug Use: No  . Sexual Activity:    Partners: Male    Birth Control/ Protection: Post-menopausal   Other Topics Concern  . Not on file   Social History Narrative   Review of Systems  Constitutional: Positive for fever, chills and fatigue.  HENT: Positive for congestion, postnasal drip, rhinorrhea, sinus pressure, sore throat and voice change. Negative for hearing loss, nosebleeds and trouble swallowing.   Respiratory: Positive for cough.   Gastrointestinal: Positive for abdominal pain and diarrhea.  Negative for nausea, vomiting, constipation, blood in stool, abdominal distention and anal bleeding.  Genitourinary: Positive for dysuria, frequency and vaginal bleeding. Negative for vaginal discharge and menstrual problem.  Skin: Negative for color change and pallor.    Objective:   Physical Exam  Constitutional: She is oriented to person, place, and time. She appears well-developed and well-nourished. No distress.  HENT:  Head: Normocephalic and atraumatic.  Right Ear: A middle ear effusion is present.  Rhinitis.   Eyes: Conjunctivae and EOM are normal.  Neck: Neck supple.    Cardiovascular: Normal rate, regular rhythm, S1 normal, S2 normal and normal heart sounds.   No murmur heard. Pulmonary/Chest: Effort normal and breath sounds normal. No respiratory distress. She has no wheezes. She has no rales.  Abdominal: Bowel sounds are increased. There is tenderness (worse) in the right lower quadrant, suprapubic area and left lower quadrant.  Musculoskeletal: Normal range of motion.  Positive CVA tenderness bilaterally.  Lymphadenopathy:       Head (right side): Tonsillar adenopathy present.       Head (left side): Tonsillar adenopathy present.  Neurological: She is alert and oriented to person, place, and time.  Skin: Skin is warm. She is diaphoretic.  Clammy.  Psychiatric: She has a normal mood and affect. Her behavior is normal.  Nursing note and vitals reviewed.  Filed Vitals:   06/19/15 0951  BP: 144/86  Pulse: 96  Temp: 98.6 F (37 C)  TempSrc: Oral  Resp: 16  Height: 5\' 4"  (1.626 m)  Weight: 202 lb 4 oz (91.74 kg)  SpO2: 99%   Results for orders placed or performed in visit on 06/19/15  POCT urinalysis dipstick  Result Value Ref Range   Color, UA yellow yellow   Clarity, UA cloudy (A) clear   Glucose, UA negative negative   Bilirubin, UA negative negative   Ketones, POC UA negative negative   Spec Grav, UA 1.020    Blood, UA moderate (A) negative   pH, UA 5.5    Protein Ur, POC =100 (A) negative   Urobilinogen, UA 0.2    Nitrite, UA Positive (A) Negative   Leukocytes, UA large (3+) (A) Negative  POCT Microscopic Urinalysis ([facility])  Result Value Ref Range   WBC,UR,HPF,POC Too numerous to count  (A) None WBC/hpf   RBC,UR,HPF,POC Many (A) None RBC/hpf   Bacteria Many (A) None, Too numerous to count   Mucus Absent Absent   Epithelial Cells, UR Per Microscopy Few (A) None, Too numerous to count cells/hpf  POCT rapid strep A  Result Value Ref Range   Rapid Strep A Screen Negative Negative    Assessment & Plan:   1. Urinary frequency -  very freq recurrent UTIs resolved while pt was on macrobid qd x 1 mo but now has been off of it for just a few wks and already UTI sxs have recurred.  Treat acute UTI with augmentin then transition back to prophylactic qd macrobid.  Pt's gynecologist might put her on a topical estrogen around urethra to help if her current work-up is benign for endometrial cancer  2. Acute pharyngitis, unspecified etiology - will be covered with the augmentin  3. Seborrheic keratoses - reassured benign - offered removal with cryosurg but pt declined today  4. Diarrhea, unspecified type - chronic and recurrent for pt, likely etiology of the freq UTIs   Pt has had episodes of post-menopausal bleeding sev times/yr so referred pt to gyn at last visit. Abnml pap with concern  for endometrial malignancy noted by pathologist though cervical cytology and hpv were normal/neg. Pt has endometrial biopsy sched this wk but is still not wanting to do the pelvic US due to cost.   Reinforced importance of compliance with recomended eval to get better prognosis and easier treatment.  Orders Placed This Encounter  Procedures  . Urine culture  . POCT urinalysis dipstick  . POCT Microscopic Urinalysis ([facility])  . POCT rapid strep A    Meds ordered this encounter  Medications  . amoxicillin-clavulanate (AUGMENTIN) 875-125 MG tablet    Sig: Take 1 tablet by mouth 2 (two) times daily.    Dispense:  20 tablet    Refill:  0  . nitrofurantoin, macrocrystal-monohydrate, (MACROBID) 100 MG capsule    Sig: Take 1 capsule (100 mg total) by mouth at bedtime.    Dispense:  90 capsule    Refill:  0    Needs OV for additional refills    I personally performed the services described in this documentation, which was scribed in my presence. The recorded information has been reviewed and considered, and addended by me as needed.  Delman Cheadle, MD MPH

## 2015-06-21 ENCOUNTER — Ambulatory Visit (INDEPENDENT_AMBULATORY_CARE_PROVIDER_SITE_OTHER): Payer: Managed Care, Other (non HMO) | Admitting: Obstetrics and Gynecology

## 2015-06-21 ENCOUNTER — Encounter: Payer: Self-pay | Admitting: Obstetrics and Gynecology

## 2015-06-21 VITALS — BP 150/92 | HR 84 | Resp 16 | Wt 204.0 lb

## 2015-06-21 DIAGNOSIS — R87610 Atypical squamous cells of undetermined significance on cytologic smear of cervix (ASC-US): Secondary | ICD-10-CM | POA: Diagnosis not present

## 2015-06-21 DIAGNOSIS — N950 Postmenopausal bleeding: Secondary | ICD-10-CM

## 2015-06-21 LAB — URINE CULTURE

## 2015-06-21 NOTE — Progress Notes (Signed)
Patient ID: Kimberly Griffith, female   DOB: Nov 29, 1955, 60 y.o.   MRN: [id number] GYNECOLOGY  VISIT   HPI: 60 y.o.   Married  Caucasian  female   [phone] with Patient's last menstrual period was 02/21/2011 (approximate).   here for endometrial biopsy.   Pap showing ASCUS, negative HR HPV, histiocytes and phagocytosis.  GYNECOLOGIC HISTORY: Patient's last menstrual period was 02/21/2011 (approximate). Contraception:  Postmenopausal Menopausal hormone therapy:  none Last mammogram:  06-18-15 Density B/Neg/BiRads1:[facility] Last pap smear:  06-07-15  Ascus;Neg (Histiocytes with phagocytosis of acute inflammatory cells present. These findings may be associated with enough increased risk for neoplasia to warrant endometrial evaluation. )        OB History    Gravida Para Term Preterm AB TAB SAB Ectopic Multiple Living   7 6   1  1   6          There are no active problems to display for this patient.   Past Medical History  Diagnosis Date  . Allergy     kiwi  . History of kidney stones 2008  . Blood transfusion without reported diagnosis 1993    Dx'd with E.Coli    Past Surgical History  Procedure Laterality Date  . Tonsillectomy and adenoidectomy    . Wisdom tooth extraction      Current Outpatient Prescriptions  Medication Sig Dispense Refill  . amoxicillin-clavulanate (AUGMENTIN) 875-125 MG tablet Take 1 tablet by mouth 2 (two) times daily. 20 tablet 0  . nitrofurantoin, macrocrystal-monohydrate, (MACROBID) 100 MG capsule Take 1 capsule (100 mg total) by mouth at bedtime. 90 capsule 0  . Probiotic Product (PROBIOTIC DAILY PO) Take 1 tablet by mouth daily.     No current facility-administered medications for this visit.     ALLERGIES: Review of patient's allergies indicates no known allergies.  Family History  Problem Relation Age of Onset  . Hypertension Mother   . Hypertension Maternal Grandmother   . Stroke Maternal Grandmother   . Heart disease Paternal  Grandfather   . Other Father     dec unknown cause age 77  . Hypertension Father     Social History   Social History  . Marital Status: Married    Spouse Name: N/A  . Number of Children: N/A  . Years of Education: N/A   Occupational History  . Not on file.   Social History Main Topics  . Smoking status: Former Smoker    Types: Cigarettes  . Smokeless tobacco: Former Systems developer    Quit date: 02/21/1984  . Alcohol Use: 0.0 oz/week    0 Standard drinks or equivalent per week     Comment: one drink/month  . Drug Use: No  . Sexual Activity:    Partners: Male    Birth Control/ Protection: Post-menopausal   Other Topics Concern  . Not on file   Social History Narrative    ROS:  Pertinent items are noted in HPI.  PHYSICAL EXAMINATION:    BP 150/92 mmHg  Pulse 84  Resp 16  Wt 204 lb (92.534 kg)  LMP 02/21/2011 (Approximate)    General appearance: alert, cooperative and appears stated age   Pelvic: External genitalia:  no lesions              Urethra:  normal appearing urethra with no masses, tenderness or lesions              Bartholins and Skenes: normal  Vagina: normal appearing vagina with normal color and discharge, no lesions              Cervix: no lesions               Bimanual Exam:  Uterus:  normal size, contour, position, consistency, mobility, non-tender              Adnexa: normal adnexa and no mass, fullness, tenderness    Procedure - endometrial biopsy Consent performed. Speculum place in vagina.  Sterile prep of cervix with   Tenaculum to anterior cervical lip. Pipelle placed to    7     cm without difficulty twice. Tissue obtained and sent to pathology. Speculum removed.  No complications. Minimal EBL.  Chaperone was present for exam.  ASSESSMENT  ASCUS pap, negative HR HPV.  Concern for potential neoplasia. Postmenopausal bleeding.  PLAN  Discussion of postmenopausal bleeding - potential etiologies including atrophy, polyp,  hyperplasia, and cancer.  Follow up EMB. Post EMB instructions given. Anticipate pelvic ultrasound.  Understands that further evaluation and treatment may be needed based on the biopsy results.    An After Visit Summary was printed and given to the patient.  __15____ minutes face to face time of which over 50% was spent in counseling.

## 2015-06-21 NOTE — Patient Instructions (Signed)

## 2015-06-23 ENCOUNTER — Telehealth: Payer: Self-pay | Admitting: Obstetrics and Gynecology

## 2015-06-23 ENCOUNTER — Encounter: Payer: Self-pay | Admitting: Obstetrics and Gynecology

## 2015-06-23 NOTE — Telephone Encounter (Signed)
Phone call to patient EMB results shared showing endometrial cancer.   Will refer patient to Dr. Everitt Amber.  Will inquire if she would like to have a pelvic ultrasound done at [facility] or [facility] prior to office visit.  Questions invited and answered.

## 2015-06-23 NOTE — Telephone Encounter (Signed)
Phone call to patient after hours.  LM to return call with test results. No details given.   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  Patient will need referral to GYN ONC and pelvic ultrasound.

## 2015-06-24 NOTE — Telephone Encounter (Signed)
Call to Dr Serita Grit office. Spoke to Santiago Glad and appointment scheduled for 07-05-15 at 1100.  She will check with NP to se if pelvic ultrasound is needed or CT scan and then will call us back.

## 2015-06-24 NOTE — Telephone Encounter (Signed)
Call to patient. Left message confirming Monday 07-05-15 is all that is needed. Call back if any additional questions. Encounter closed.

## 2015-06-24 NOTE — Telephone Encounter (Signed)
Return call from Dr Serita Grit office. Santiago Glad states another physician has reviewed this record and states that no other testing is needed right now. Proceed with appointment as scheduled for 07-05-15. Routing to Dr Quincy Simmonds for review.

## 2015-06-24 NOTE — Telephone Encounter (Signed)
Thank you for the update.  Please let patient know of the appointment date and time and no imaging needed at this time.

## 2015-07-05 ENCOUNTER — Ambulatory Visit: Payer: Managed Care, Other (non HMO) | Attending: Gynecologic Oncology | Admitting: Gynecologic Oncology

## 2015-07-05 ENCOUNTER — Encounter: Payer: Self-pay | Admitting: Gynecologic Oncology

## 2015-07-05 VITALS — BP 136/76 | HR 74 | Temp 98.4°F | Resp 18 | Ht 64.0 in | Wt 205.5 lb

## 2015-07-05 DIAGNOSIS — Z87442 Personal history of urinary calculi: Secondary | ICD-10-CM | POA: Diagnosis not present

## 2015-07-05 DIAGNOSIS — Z9889 Other specified postprocedural states: Secondary | ICD-10-CM | POA: Insufficient documentation

## 2015-07-05 DIAGNOSIS — Z87891 Personal history of nicotine dependence: Secondary | ICD-10-CM | POA: Insufficient documentation

## 2015-07-05 DIAGNOSIS — Z79899 Other long term (current) drug therapy: Secondary | ICD-10-CM | POA: Insufficient documentation

## 2015-07-05 DIAGNOSIS — C541 Malignant neoplasm of endometrium: Secondary | ICD-10-CM | POA: Insufficient documentation

## 2015-07-05 NOTE — Patient Instructions (Signed)
Preparing for your Surgery  Plan for surgery on Jul 13, 2015 with Dr. Janie Morning at [facility].  You will be scheduled for a robotic assisted total hysterectomy, bilateral salpingo-oophorectomy, sentinel lymph node dissection.   Pre-operative Testing -You will receive a phone call from presurgical testing at [facility] to arrange for a pre-operative testing appointment before your surgery.  This appointment normally occurs one to two weeks before your scheduled surgery.   -Bring your insurance card, copy of an advanced directive if applicable, medication list  -At that visit, you will be asked to sign a consent for a possible blood transfusion in case a transfusion becomes necessary during surgery.  The need for a blood transfusion is rare but having consent is a necessary part of your care.     -You should not be taking blood thinners or aspirin at least ten days prior to surgery unless instructed by your surgeon.  Day Before Surgery at [place] will be asked to take in a light diet the day before surgery.  Avoid carbonated beverages.  You will be advised to have nothing to eat or drink after midnight the evening before.     Eat a light diet the day before surgery.  Examples including soups, broths, toast, yogurt, mashed potatoes.  Things to avoid include carbonated beverages (fizzy beverages), raw fruits and raw vegetables, or beans.    If your bowels are filled with gas, your surgeon will have difficulty visualizing your pelvic organs which increases your surgical risks.  Your role in recovery Your role is to become active as soon as directed by your doctor, while still giving yourself time to heal.  Rest when you feel tired. You will be asked to do the following in order to speed your recovery:  - Cough and breathe deeply. This helps toclear and expand your lungs and can prevent pneumonia. You may be given a spirometer to practice deep breathing. A staff member will  show you how to use the spirometer. - Do mild physical activity. Walking or moving your legs help your circulation and body functions return to normal. A staff member will help you when you try to walk and will provide you with simple exercises. Do not try to get up or walk alone the first time. - Actively manage your pain. Managing your pain lets you move in comfort. We will ask you to rate your pain on a scale of zero to 10. It is your responsibility to tell your doctor or nurse where and how much you hurt so your pain can be treated.  Special Considerations -If you are diabetic, you may be placed on insulin after surgery to have closer control over your blood sugars to promote healing and recovery.  This does not mean that you will be discharged on insulin.  If applicable, your oral antidiabetics will be resumed when you are tolerating a solid diet.  -Your final pathology results from surgery should be available by the Friday after surgery and the results will be relayed to you when available.  Blood Transfusion Information WHAT IS A BLOOD TRANSFUSION? A transfusion is the replacement of blood or some of its parts. Blood is made up of multiple cells which provide different functions.  Red blood cells carry oxygen and are used for blood loss replacement.  White blood cells fight against infection.  Platelets control bleeding.  Plasma helps clot blood.  Other blood products are available for specialized needs, such as hemophilia or other  clotting disorders. BEFORE THE TRANSFUSION  Who gives blood for transfusions?   You may be able to donate blood to be used at a later date on yourself (autologous donation).  Relatives can be asked to donate blood. This is generally not any safer than if you have received blood from a stranger. The same precautions are taken to ensure safety when a relative's blood is donated.  Healthy volunteers who are fully evaluated to make sure their blood is safe.  This is blood bank blood. Transfusion therapy is the safest it has ever been in the practice of medicine. Before blood is taken from a donor, a complete history is taken to make sure that person has no history of diseases nor engages in risky social behavior (examples are intravenous drug use or sexual activity with multiple partners). The donor's travel history is screened to minimize risk of transmitting infections, such as malaria. The donated blood is tested for signs of infectious diseases, such as HIV and hepatitis. The blood is then tested to be sure it is compatible with you in order to minimize the chance of a transfusion reaction. If you or a relative donates blood, this is often done in anticipation of surgery and is not appropriate for emergency situations. It takes many days to process the donated blood. RISKS AND COMPLICATIONS Although transfusion therapy is very safe and saves many lives, the main dangers of transfusion include:   Getting an infectious disease.  Developing a transfusion reaction. This is an allergic reaction to something in the blood you were given. Every precaution is taken to prevent this. The decision to have a blood transfusion has been considered carefully by your caregiver before blood is given. Blood is not given unless the benefits outweigh the risks.

## 2015-07-05 NOTE — Progress Notes (Signed)
Consult Note: Gyn-Onc  Consult was requested by Dr. Quincy Simmonds for the evaluation of Kimberly Griffith 60 y.o. female  CC:  Chief Complaint  Patient presents with  . New Consultation    Endometrial cancer    Assessment/Plan:  Ms. Kimberly Griffith  is a 60 y.o.  year old with grade 1 endometrial cancer.   A detailed discussion was held with the patient and her family with regard to to her endometrial cancer diagnosis. We discussed the standard management options for uterine cancer which includes surgery followed possibly by adjuvant therapy depending on the results of surgery. The options for surgical management include a hysterectomy and removal of the tubes and ovaries possibly with removal of pelvic and para-aortic lymph nodes. A minimally invasive approach including a robotic hysterectomy or laparoscopic hysterectomy have benefits including shorter hospital stay, recovery time and better wound healing. The alternative approach is an open hysterectomy. The patient has been counseled about these surgical options and the risks of surgery in general including infection, bleeding, damage to surrounding structures (including bowel, bladder, ureters, nerves or vessels), and the postoperative risks of PE/ DVT, and lymphedema. I extensively reviewed the additional risks of robotic hysterectomy including possible need for conversion to open laparotomy.  I discussed positioning during surgery of trendelenberg and risks of minor facial swelling and care we take in preoperative positioning.  After counseling and consideration of her options, she desires to proceed with robotic hysterectomy, BSO, sentinel lymph node biopsy.   She will be seen by anesthesia for preoperative clearance and discussion of postoperative pain management.  She was given the opportunity to ask questions, which were answered to her satisfaction, and she is agreement with the above mentioned plan of care.   HPI: Kimberly Griffith is a 60 year old woman  (P6) who is seen in consultation at the request of Dr Quincy Simmonds for grade 1 endometrial cancer.  She has been having postmenopausal spotting since she was 28 when she passed through menopause. It was lightl. She reported this to Dr Cathie Olden at her May, 2017 gynecologic exam. This prompted Dr Quincy Simmonds to perform an endometrial pipelle biopsy.  The biopsy revealed grade 1 endometrial cancer.  She is otherwise very healthy, though obese (with a bmi of 35kg/m2). She has had no prior abdominal surgeries.  She has delivered 6 babies vaginally.   Her pap from 2017 showed ASCUS but negative for high risk HPV.  Current Meds:  Outpatient Encounter Prescriptions as of 07/05/2015  Medication Sig  . nitrofurantoin, macrocrystal-monohydrate, (MACROBID) 100 MG capsule Take 1 capsule (100 mg total) by mouth at bedtime.  . Probiotic Product (PROBIOTIC DAILY PO) Take 1 tablet by mouth daily.  . [DISCONTINUED] amoxicillin-clavulanate (AUGMENTIN) 875-125 MG tablet Take 1 tablet by mouth 2 (two) times daily.   No facility-administered encounter medications on file as of 07/05/2015.    Allergy: No Known Allergies  Social Hx:   Social History   Social History  . Marital Status: Married    Spouse Name: N/A  . Number of Children: N/A  . Years of Education: N/A   Occupational History  . Not on file.   Social History Main Topics  . Smoking status: Former Smoker    Types: Cigarettes    Quit date: 07/04/1984  . Smokeless tobacco: Former Systems developer    Quit date: 02/21/1984  . Alcohol Use: 0.0 oz/week    0 Standard drinks or equivalent per week     Comment: one drink/month  . Drug Use: No  .  Sexual Activity:    Partners: Male    Birth Control/ Protection: Post-menopausal   Other Topics Concern  . Not on file   Social History Narrative    Past Surgical Hx:  Past Surgical History  Procedure Laterality Date  . Tonsillectomy and adenoidectomy    . Wisdom tooth extraction      Past Medical Hx:  Past  Medical History  Diagnosis Date  . Allergy     kiwi  . History of kidney stones 2008  . Blood transfusion without reported diagnosis 1993    Dx'd with E.Coli    Past Gynecological History:  P6Patient's last menstrual period was 02/21/2011 (approximate).  Family Hx:  Family History  Problem Relation Age of Onset  . Hypertension Mother   . Hypertension Maternal Grandmother   . Stroke Maternal Grandmother   . Heart disease Paternal Grandfather   . Other Father     dec unknown cause age 68  . Hypertension Father     Review of Systems:  Constitutional  Feels well,    ENT Normal appearing ears and nares bilaterally Skin/Breast  No rash, sores, jaundice, itching, dryness Cardiovascular  No chest pain, shortness of breath, or edema  Pulmonary  No cough or wheeze.  Gastro Intestinal  No nausea, vomitting, or diarrhoea. No bright red blood per rectum, no abdominal pain, change in bowel movement, or constipation.  Genito Urinary  No frequency, urgency, dysuria,  Musculo Skeletal  No myalgia, arthralgia, joint swelling or pain  Neurologic  No weakness, numbness, change in gait,  Psychology  No depression, anxiety, insomnia.   Vitals:  Blood pressure 136/76, pulse 74, temperature 98.4 F (36.9 C), temperature source Oral, resp. rate 18, height 5\' 4"  (1.626 m), weight 205 lb 8 oz (93.214 kg), last menstrual period 02/21/2011, SpO2 100 %.  Physical Exam: WD in NAD Neck  Supple NROM, without any enlargements.  Lymph Node Survey No cervical supraclavicular or inguinal adenopathy Cardiovascular  Pulse normal rate, regularity and rhythm. S1 and S2 normal.  Lungs  Clear to auscultation bilateraly, without wheezes/crackles/rhonchi. Good air movement.  Skin  No rash/lesions/breakdown  Psychiatry  Alert and oriented to person, place, and time  Abdomen  Normoactive bowel sounds, abdomen soft, non-tender and obese without evidence of hernia.  Back No CVA tenderness Genito  Urinary  Vulva/vagina: Normal external female genitalia. No lesions. No discharge or bleeding.  Bladder/urethra:  No lesions or masses, well supported bladder  Vagina: normal  Cervix: Normal appearing, no lesions.  Uterus: Small, mobile, no parametrial involvement or nodularity.  Adnexa: no masses. Rectal  Good tone, no masses no cul de sac nodularity.  Extremities  No bilateral cyanosis, clubbing or edema.   Donaciano Eva, MD  07/05/2015, 11:52 AM

## 2015-07-09 ENCOUNTER — Ambulatory Visit (HOSPITAL_COMMUNITY)
Admission: RE | Admit: 2015-07-09 | Discharge: 2015-07-09 | Disposition: A | Discharge status: Home / Self Care | Payer: Managed Care, Other (non HMO) | Source: Ambulatory Visit | Attending: Gynecologic Oncology | Admitting: Gynecologic Oncology

## 2015-07-09 ENCOUNTER — Encounter (HOSPITAL_COMMUNITY)
Admission: RE | Admit: 2015-07-09 | Discharge: 2015-07-09 | Disposition: A | Discharge status: Home / Self Care | Payer: Managed Care, Other (non HMO) | Source: Ambulatory Visit | Attending: Gynecologic Oncology | Admitting: Gynecologic Oncology

## 2015-07-09 ENCOUNTER — Encounter (HOSPITAL_COMMUNITY): Payer: Self-pay

## 2015-07-09 DIAGNOSIS — Z01818 Encounter for other preprocedural examination: Secondary | ICD-10-CM | POA: Diagnosis not present

## 2015-07-09 DIAGNOSIS — C541 Malignant neoplasm of endometrium: Secondary | ICD-10-CM | POA: Insufficient documentation

## 2015-07-09 HISTORY — DX: Pneumonia, unspecified organism: J18.9

## 2015-07-09 HISTORY — DX: Headache, unspecified: R51.9

## 2015-07-09 HISTORY — DX: Headache: R51

## 2015-07-09 HISTORY — DX: Chronic kidney disease, unspecified: N18.9

## 2015-07-09 HISTORY — DX: Tremor, unspecified: R25.1

## 2015-07-09 HISTORY — DX: Malignant (primary) neoplasm, unspecified: C80.1

## 2015-07-09 LAB — CBC WITH DIFFERENTIAL/PLATELET
BASOS ABS: 0.1 10*3/uL (ref 0.0–0.1)
BASOS PCT: 1 %
EOS PCT: 6 %
Eosinophils Absolute: 0.4 10*3/uL (ref 0.0–0.7)
HEMATOCRIT: 41.5 % (ref 36.0–46.0)
Hemoglobin: 14.4 g/dL (ref 12.0–15.0)
Lymphocytes Relative: 33 %
Lymphs Abs: 2 10*3/uL (ref 0.7–4.0)
MCH: 28.9 pg (ref 26.0–34.0)
MCHC: 34.7 g/dL (ref 30.0–36.0)
MCV: 83.3 fL (ref 78.0–100.0)
MONO ABS: 0.5 10*3/uL (ref 0.1–1.0)
Monocytes Relative: 7 %
NEUTROS ABS: 3.3 10*3/uL (ref 1.7–7.7)
Neutrophils Relative %: 53 %
PLATELETS: 260 10*3/uL (ref 150–400)
RBC: 4.98 MIL/uL (ref 3.87–5.11)
RDW: 13.5 % (ref 11.5–15.5)
WBC: 6.2 10*3/uL (ref 4.0–10.5)

## 2015-07-09 LAB — COMPREHENSIVE METABOLIC PANEL
ALBUMIN: 4.2 g/dL (ref 3.5–5.0)
ALT: 22 U/L (ref 14–54)
AST: 24 U/L (ref 15–41)
Alkaline Phosphatase: 72 U/L (ref 38–126)
Anion gap: 9 (ref 5–15)
BUN: 10 mg/dL (ref 6–20)
CHLORIDE: 106 mmol/L (ref 101–111)
CO2: 26 mmol/L (ref 22–32)
Calcium: 9.2 mg/dL (ref 8.9–10.3)
Creatinine, Ser: 0.67 mg/dL (ref 0.44–1.00)
GFR calc Af Amer: >60 mL/min (ref >60)
GFR calc non Af Amer: >60 mL/min (ref >60)
GLUCOSE: 103 mg/dL — AB (ref 65–99)
POTASSIUM: 3.7 mmol/L (ref 3.5–5.1)
Sodium: 141 mmol/L (ref 135–145)
Total Bilirubin: 0.7 mg/dL (ref 0.3–1.2)
Total Protein: 7.3 g/dL (ref 6.5–8.1)

## 2015-07-09 LAB — ABO/RH: ABO/RH(D): A POS

## 2015-07-09 LAB — URINALYSIS, ROUTINE W REFLEX MICROSCOPIC
Bilirubin Urine: NEGATIVE
GLUCOSE, UA: NEGATIVE mg/dL
HGB URINE DIPSTICK: NEGATIVE
Ketones, ur: NEGATIVE mg/dL
Nitrite: NEGATIVE
PH: 7 (ref 5.0–8.0)
Protein, ur: NEGATIVE mg/dL
SPECIFIC GRAVITY, URINE: 1.009 (ref 1.005–1.030)

## 2015-07-09 LAB — URINE MICROSCOPIC-ADD ON: RBC / HPF: NONE SEEN RBC/hpf (ref 0–5)

## 2015-07-09 LAB — HCG, SERUM, QUALITATIVE: Preg, Serum: NEGATIVE

## 2015-07-09 NOTE — Pre-Procedure Instructions (Signed)
Urinalysis result routed to Dr. Skeet Latch

## 2015-07-09 NOTE — Patient Instructions (Signed)
Kimberly Griffith  07/09/2015   Your procedure is scheduled on: 07/13/15  Report to [facility] Main  Entrance take [facility]  elevators to 3rd floor to  [place] at 8:30 AM.  Call this number if you have problems the morning of surgery [phone]   Remember: ONLY 1 PERSON MAY GO WITH YOU TO SHORT STAY TO GET  READY MORNING OF [place].  Do not eat food or drink liquids :After Midnight Monday     Take these medicines the morning of surgery with A SIP OF WATER: none                                You may not have any metal on your body including hair pins and              piercings  Do not wear jewelry, make-up, lotions, powders or perfumes, deodorant             Do not wear nail polish.  Do not shave  48 hours prior to surgery.                 Do not bring valuables to the hospital. [place].  Contacts, dentures or bridgework may not be worn into surgery.  Leave suitcase in the car. After surgery it may be brought to your room. _____________________________________________________________________             [facility] - Preparing for Surgery Before surgery, you can play an important role.  Because skin is not sterile, your skin needs to be as free of germs as possible.  You can reduce the number of germs on your skin by washing with CHG (chlorahexidine gluconate) soap before surgery.  CHG is an antiseptic cleaner which kills germs and bonds with the skin to continue killing germs even after washing. Please DO NOT use if you have an allergy to CHG or antibacterial soaps.  If your skin becomes reddened/irritated stop using the CHG and inform your nurse when you arrive at Short Stay. Do not shave (including legs and underarms) for at least 48 hours prior to the first CHG shower.  You may shave your face/neck. Please follow these instructions carefully:  1.  Shower with CHG Soap the night before surgery and  the  morning of Surgery.  2.  If you choose to wash your hair, wash your hair first as usual with your  normal  shampoo.  3.  After you shampoo, rinse your hair and body thoroughly to remove the  shampoo.                           4.  Use CHG as you would any other liquid soap.  You can apply chg directly  to the skin and wash                       Gently with a scrungie or clean washcloth.  5.  Apply the CHG Soap to your body ONLY FROM THE NECK DOWN.   Do not use on face/ open  Wound or open sores. Avoid contact with eyes, ears mouth and genitals (private parts).                       Wash face,  Genitals (private parts) with your normal soap.             6.  Wash thoroughly, paying special attention to the area where your surgery  will be performed.  7.  Thoroughly rinse your body with warm water from the neck down.  8.  DO NOT shower/wash with your normal soap after using and rinsing off  the CHG Soap.                9.  Pat yourself dry with a clean towel.            10.  Wear clean pajamas.            11.  Place clean sheets on your bed the night of your first shower and do not  sleep with pets. Day of Surgery : Do not apply any lotions/deodorants the morning of surgery.  Please wear clean clothes to the hospital/surgery center.  FAILURE TO FOLLOW THESE INSTRUCTIONS MAY RESULT IN THE CANCELLATION OF YOUR SURGERY PATIENT SIGNATURE_________________________________  NURSE SIGNATURE__________________________________  ________________________________________________________________________   Adam Phenix  An incentive spirometer is a tool that can help keep your lungs clear and active. This tool measures how well you are filling your lungs with each breath. Taking long deep breaths may help reverse or decrease the chance of developing breathing (pulmonary) problems (especially infection) following:  A long period of time when you are unable to move or be  active. BEFORE THE PROCEDURE   If the spirometer includes an indicator to show your best effort, your nurse or respiratory therapist will set it to a desired goal.  If possible, sit up straight or lean slightly forward. Try not to slouch.  Hold the incentive spirometer in an upright position. INSTRUCTIONS FOR USE  1. Sit on the edge of your bed if possible, or sit up as far as you can in bed or on a chair. 2. Hold the incentive spirometer in an upright position. 3. Breathe out normally. 4. Place the mouthpiece in your mouth and seal your lips tightly around it. 5. Breathe in slowly and as deeply as possible, raising the piston or the ball toward the top of the column. 6. Hold your breath for 3-5 seconds or for as long as possible. Allow the piston or ball to fall to the bottom of the column. 7. Remove the mouthpiece from your mouth and breathe out normally. 8. Rest for a few seconds and repeat Steps 1 through 7 at least 10 times every 1-2 hours when you are awake. Take your time and take a few normal breaths between deep breaths. 9. The spirometer may include an indicator to show your best effort. Use the indicator as a goal to work toward during each repetition. 10. After each set of 10 deep breaths, practice coughing to be sure your lungs are clear. If you have an incision (the cut made at the time of surgery), support your incision when coughing by placing a pillow or rolled up towels firmly against it. Once you are able to get out of bed, walk around indoors and cough well. You may stop using the incentive spirometer when instructed by your caregiver.  RISKS AND COMPLICATIONS  Take your time so you do not get  dizzy or light-headed.  If you are in pain, you may need to take or ask for pain medication before doing incentive spirometry. It is harder to take a deep breath if you are having pain. AFTER USE  Rest and breathe slowly and easily.  It can be helpful to keep track of a log of  your progress. Your caregiver can provide you with a simple table to help with this. If you are using the spirometer at home, follow these instructions: [place] IF:   You are having difficultly using the spirometer.  You have trouble using the spirometer as often as instructed.  Your pain medication is not giving enough relief while using the spirometer.  You develop fever of 100.5 F (38.1 C) or higher. SEEK IMMEDIATE MEDICAL CARE IF:   You cough up bloody sputum that had not been present before.  You develop fever of 102 F (38.9 C) or greater.  You develop worsening pain at or near the incision site. MAKE SURE YOU:   Understand these instructions.  Will watch your condition.  Will get help right away if you are not doing well or get worse. Document Released: 06/19/2006 Document Revised: 05/01/2011 Document Reviewed: 08/20/2006 Exit[employer] Patient Information 2014 [place].   ________________________________________________________________________   Incentive Spirometer  An incentive spirometer is a tool that can help keep your lungs clear and active. This tool measures how well you are filling your lungs with each breath. Taking long deep breaths may help reverse or decrease the chance of developing breathing (pulmonary) problems (especially infection) following:  A long period of time when you are unable to move or be active. BEFORE THE PROCEDURE   If the spirometer includes an indicator to show your best effort, your nurse or respiratory therapist will set it to a desired goal.  If possible, sit up straight or lean slightly forward. Try not to slouch.  Hold the incentive spirometer in an upright position. INSTRUCTIONS FOR USE  11. Sit on the edge of your bed if possible, or sit up as far as you can in bed or on a chair. 12. Hold the incentive spirometer in an upright position. 13. Breathe out normally. 14. Place the mouthpiece in your mouth and  seal your lips tightly around it. 15. Breathe in slowly and as deeply as possible, raising the piston or the ball toward the top of the column. 16. Hold your breath for 3-5 seconds or for as long as possible. Allow the piston or ball to fall to the bottom of the column. 17. Remove the mouthpiece from your mouth and breathe out normally. 18. Rest for a few seconds and repeat Steps 1 through 7 at least 10 times every 1-2 hours when you are awake. Take your time and take a few normal breaths between deep breaths. 19. The spirometer may include an indicator to show your best effort. Use the indicator as a goal to work toward during each repetition. 20. After each set of 10 deep breaths, practice coughing to be sure your lungs are clear. If you have an incision (the cut made at the time of surgery), support your incision when coughing by placing a pillow or rolled up towels firmly against it. Once you are able to get out of bed, walk around indoors and cough well. You may stop using the incentive spirometer when instructed by your caregiver.  RISKS AND COMPLICATIONS  Take your time so you do not get dizzy or light-headed.  If  you are in pain, you may need to take or ask for pain medication before doing incentive spirometry. It is harder to take a deep breath if you are having pain. AFTER USE  Rest and breathe slowly and easily.  It can be helpful to keep track of a log of your progress. Your caregiver can provide you with a simple table to help with this. If you are using the spirometer at home, follow these instructions: [place] IF:   You are having difficultly using the spirometer.  You have trouble using the spirometer as often as instructed.  Your pain medication is not giving enough relief while using the spirometer.  You develop fever of 100.5 F (38.1 C) or higher. SEEK IMMEDIATE MEDICAL CARE IF:   You cough up bloody sputum that had not been present before.  You develop  fever of 102 F (38.9 C) or greater.  You develop worsening pain at or near the incision site. MAKE SURE YOU:   Understand these instructions.  Will watch your condition.  Will get help right away if you are not doing well or get worse. Document Released: 06/19/2006 Document Revised: 05/01/2011 Document Reviewed: 08/20/2006 [facility] Patient Information 2014 [place], [place].   ________________________________________________________________________

## 2015-07-13 ENCOUNTER — Ambulatory Visit (HOSPITAL_COMMUNITY): Payer: Managed Care, Other (non HMO) | Admitting: Anesthesiology

## 2015-07-13 ENCOUNTER — Ambulatory Visit (HOSPITAL_COMMUNITY)
Admission: RE | Admit: 2015-07-13 | Discharge: 2015-07-14 | Disposition: A | Discharge status: Home / Self Care | Payer: Managed Care, Other (non HMO) | Source: Ambulatory Visit | Attending: Obstetrics & Gynecology | Admitting: Obstetrics & Gynecology

## 2015-07-13 ENCOUNTER — Encounter (HOSPITAL_COMMUNITY)
Admission: RE | Disposition: A | Discharge status: Home / Self Care | Payer: Self-pay | Source: Ambulatory Visit | Attending: Obstetrics & Gynecology

## 2015-07-13 ENCOUNTER — Encounter (HOSPITAL_COMMUNITY): Payer: Self-pay

## 2015-07-13 DIAGNOSIS — N800 Endometriosis of uterus: Secondary | ICD-10-CM | POA: Insufficient documentation

## 2015-07-13 DIAGNOSIS — Z87891 Personal history of nicotine dependence: Secondary | ICD-10-CM | POA: Diagnosis not present

## 2015-07-13 DIAGNOSIS — Z6835 Body mass index (BMI) 35.0-35.9, adult: Secondary | ICD-10-CM | POA: Insufficient documentation

## 2015-07-13 DIAGNOSIS — N83202 Unspecified ovarian cyst, left side: Secondary | ICD-10-CM | POA: Insufficient documentation

## 2015-07-13 DIAGNOSIS — E669 Obesity, unspecified: Secondary | ICD-10-CM | POA: Diagnosis not present

## 2015-07-13 DIAGNOSIS — C541 Malignant neoplasm of endometrium: Secondary | ICD-10-CM | POA: Diagnosis not present

## 2015-07-13 DIAGNOSIS — C542 Malignant neoplasm of myometrium: Secondary | ICD-10-CM | POA: Insufficient documentation

## 2015-07-13 DIAGNOSIS — N83201 Unspecified ovarian cyst, right side: Secondary | ICD-10-CM | POA: Diagnosis not present

## 2015-07-13 HISTORY — PX: LYMPH NODE BIOPSY: SHX201

## 2015-07-13 HISTORY — PX: ROBOTIC ASSISTED TOTAL HYSTERECTOMY WITH BILATERAL SALPINGO OOPHERECTOMY: SHX6086

## 2015-07-13 LAB — TYPE AND SCREEN
ABO/RH(D): A POS
Antibody Screen: NEGATIVE

## 2015-07-13 SURGERY — HYSTERECTOMY, TOTAL, ROBOT-ASSISTED, LAPAROSCOPIC, WITH BILATERAL SALPINGO-OOPHORECTOMY
Anesthesia: General | Laterality: Bilateral

## 2015-07-13 MED ORDER — OXYCODONE HCL 5 MG PO TABS: 5.0000 mg | ORAL_TABLET | Freq: Once | ORAL | Status: DC | PRN

## 2015-07-13 MED ORDER — ONDANSETRON HCL 4 MG/2ML IJ SOLN
INTRAMUSCULAR | Status: DC | PRN
  Administered 2015-07-13: 4 mg via INTRAVENOUS

## 2015-07-13 MED ORDER — OXYCODONE-ACETAMINOPHEN 5-325 MG PO TABS
1.0000 | ORAL_TABLET | ORAL | Status: DC | PRN
  Administered 2015-07-14 (×2): 1 via ORAL
  Filled 2015-07-13 (×2): qty 1

## 2015-07-13 MED ORDER — LIDOCAINE HCL (CARDIAC) 20 MG/ML IV SOLN
INTRAVENOUS | Status: AC
  Filled 2015-07-13: qty 5

## 2015-07-13 MED ORDER — CEFAZOLIN SODIUM-DEXTROSE 2-4 GM/100ML-% IV SOLN
2.0000 g | INTRAVENOUS | Status: AC
  Administered 2015-07-13: 2 g via INTRAVENOUS
  Filled 2015-07-13: qty 100

## 2015-07-13 MED ORDER — ONDANSETRON HCL 4 MG/2ML IJ SOLN
4.0000 mg | Freq: Four times a day (QID) | INTRAMUSCULAR | Status: DC | PRN
Start: 2015-07-13 — End: 2015-07-14
  Administered 2015-07-13 (×2): 4 mg via INTRAVENOUS
  Filled 2015-07-13 (×2): qty 2

## 2015-07-13 MED ORDER — ACETAMINOPHEN 325 MG PO TABS: 325.0000 mg | ORAL_TABLET | ORAL | Status: DC | PRN

## 2015-07-13 MED ORDER — FENTANYL CITRATE (PF) 250 MCG/5ML IJ SOLN
INTRAMUSCULAR | Status: AC
  Filled 2015-07-13: qty 5

## 2015-07-13 MED ORDER — HYDROMORPHONE HCL 1 MG/ML IJ SOLN
INTRAMUSCULAR | Status: DC | PRN
  Administered 2015-07-13: 1 mg via INTRAVENOUS

## 2015-07-13 MED ORDER — STERILE WATER FOR INJECTION IJ SOLN
INTRAMUSCULAR | Status: AC
  Filled 2015-07-13: qty 10

## 2015-07-13 MED ORDER — ONDANSETRON HCL 4 MG/2ML IJ SOLN
INTRAMUSCULAR | Status: AC
  Filled 2015-07-13: qty 2

## 2015-07-13 MED ORDER — ACETAMINOPHEN 160 MG/5ML PO SOLN: 325.0000 mg | ORAL | Status: DC | PRN

## 2015-07-13 MED ORDER — SUGAMMADEX SODIUM 200 MG/2ML IV SOLN
INTRAVENOUS | Status: AC
  Filled 2015-07-13: qty 2

## 2015-07-13 MED ORDER — ROCURONIUM BROMIDE 50 MG/5ML IV SOLN
INTRAVENOUS | Status: AC
  Filled 2015-07-13: qty 2

## 2015-07-13 MED ORDER — RINGERS IRRIGATION IR SOLN
Status: DC | PRN
  Administered 2015-07-13: 1000 mL

## 2015-07-13 MED ORDER — PROPOFOL 10 MG/ML IV BOLUS
INTRAVENOUS | Status: AC
  Filled 2015-07-13: qty 20

## 2015-07-13 MED ORDER — DEXAMETHASONE SODIUM PHOSPHATE 10 MG/ML IJ SOLN
INTRAMUSCULAR | Status: AC
  Filled 2015-07-13: qty 1

## 2015-07-13 MED ORDER — KCL IN DEXTROSE-NACL 20-5-0.45 MEQ/L-%-% IV SOLN
INTRAVENOUS | Status: DC
  Administered 2015-07-13: 18:00:00 via INTRAVENOUS
  Filled 2015-07-13 (×3): qty 1000

## 2015-07-13 MED ORDER — MIDAZOLAM HCL 5 MG/5ML IJ SOLN
INTRAMUSCULAR | Status: DC | PRN
  Administered 2015-07-13: 2 mg via INTRAVENOUS

## 2015-07-13 MED ORDER — OXYCODONE HCL 5 MG/5ML PO SOLN: 5.0000 mg | Freq: Once | ORAL | Status: DC | PRN

## 2015-07-13 MED ORDER — ENOXAPARIN SODIUM 40 MG/0.4ML ~~LOC~~ SOLN
40.0000 mg | SUBCUTANEOUS | Status: DC
  Filled 2015-07-13 (×2): qty 0.4

## 2015-07-13 MED ORDER — HYDROMORPHONE HCL 2 MG/ML IJ SOLN
INTRAMUSCULAR | Status: AC
  Filled 2015-07-13: qty 1

## 2015-07-13 MED ORDER — LIDOCAINE HCL (CARDIAC) 20 MG/ML IV SOLN
INTRAVENOUS | Status: DC | PRN
  Administered 2015-07-13: 50 mg via INTRAVENOUS

## 2015-07-13 MED ORDER — FAMOTIDINE IN NACL 20-0.9 MG/50ML-% IV SOLN
INTRAVENOUS | Status: AC
  Filled 2015-07-13: qty 50

## 2015-07-13 MED ORDER — HEPARIN SODIUM (PORCINE) 5000 UNIT/ML IJ SOLN
5000.0000 [IU] | INTRAMUSCULAR | Status: AC
  Administered 2015-07-13: 5000 [IU] via SUBCUTANEOUS
  Filled 2015-07-13: qty 1

## 2015-07-13 MED ORDER — SUGAMMADEX SODIUM 200 MG/2ML IV SOLN
INTRAVENOUS | Status: DC | PRN
  Administered 2015-07-13: 200 mg via INTRAVENOUS

## 2015-07-13 MED ORDER — FENTANYL CITRATE (PF) 100 MCG/2ML IJ SOLN
INTRAMUSCULAR | Status: DC | PRN
  Administered 2015-07-13 (×5): 50 ug via INTRAVENOUS

## 2015-07-13 MED ORDER — ONDANSETRON HCL 4 MG PO TABS: 4.0000 mg | ORAL_TABLET | Freq: Four times a day (QID) | ORAL | Status: DC | PRN

## 2015-07-13 MED ORDER — ROCURONIUM BROMIDE 100 MG/10ML IV SOLN
INTRAVENOUS | Status: DC | PRN
  Administered 2015-07-13: 50 mg via INTRAVENOUS
  Administered 2015-07-13: 10 mg via INTRAVENOUS

## 2015-07-13 MED ORDER — MIDAZOLAM HCL 2 MG/2ML IJ SOLN
INTRAMUSCULAR | Status: AC
  Filled 2015-07-13: qty 2

## 2015-07-13 MED ORDER — HYDROMORPHONE HCL 1 MG/ML IJ SOLN
0.2000 mg | INTRAMUSCULAR | Status: AC | PRN
  Administered 2015-07-13: 0.6 mg via INTRAVENOUS
  Administered 2015-07-13: 0.5 mg via INTRAVENOUS
  Filled 2015-07-13 (×2): qty 1

## 2015-07-13 MED ORDER — LACTATED RINGERS IV SOLN
INTRAVENOUS | Status: DC
  Administered 2015-07-13: 1000 mL via INTRAVENOUS
  Administered 2015-07-13: 13:00:00 via INTRAVENOUS

## 2015-07-13 MED ORDER — CEFAZOLIN SODIUM-DEXTROSE 2-4 GM/100ML-% IV SOLN
INTRAVENOUS | Status: AC
  Filled 2015-07-13: qty 100

## 2015-07-13 MED ORDER — DICLOFENAC SODIUM 50 MG PO TBEC
50.0000 mg | DELAYED_RELEASE_TABLET | Freq: Four times a day (QID) | ORAL | Status: DC
  Administered 2015-07-13 – 2015-07-14 (×2): 50 mg via ORAL
  Filled 2015-07-13 (×7): qty 1

## 2015-07-13 MED ORDER — HYDROMORPHONE HCL 1 MG/ML IJ SOLN: 0.2500 mg | INTRAMUSCULAR | Status: DC | PRN

## 2015-07-13 MED ORDER — GABAPENTIN 300 MG PO CAPS
600.0000 mg | ORAL_CAPSULE | Freq: Three times a day (TID) | ORAL | Status: AC
  Administered 2015-07-13: 600 mg via ORAL
  Filled 2015-07-13: qty 2

## 2015-07-13 MED ORDER — PROPOFOL 10 MG/ML IV BOLUS
INTRAVENOUS | Status: DC | PRN
  Administered 2015-07-13: 100 mg via INTRAVENOUS

## 2015-07-13 MED ORDER — STERILE WATER FOR IRRIGATION IR SOLN
Status: DC | PRN
  Administered 2015-07-13: 1000 mL

## 2015-07-13 MED ORDER — METOCLOPRAMIDE HCL 5 MG/ML IJ SOLN
INTRAMUSCULAR | Status: AC
  Filled 2015-07-13: qty 2

## 2015-07-13 SURGICAL SUPPLY — 52 items
BLADE SURG 15 STRL LF DISP TIS (BLADE) ×1 IMPLANT
BLADE SURG 15 STRL SS (BLADE) ×2
CHLORAPREP W/TINT 26ML (MISCELLANEOUS) ×3 IMPLANT
COVER SURGICAL LIGHT HANDLE (MISCELLANEOUS) ×3 IMPLANT
COVER TIP SHEARS 8 DVNC (MISCELLANEOUS) ×1 IMPLANT
COVER TIP SHEARS 8MM DA VINCI (MISCELLANEOUS) ×2
DRAPE ARM DVNC X/XI (DISPOSABLE) ×4 IMPLANT
DRAPE COLUMN DVNC XI (DISPOSABLE) ×1 IMPLANT
DRAPE DA VINCI XI ARM (DISPOSABLE) ×8
DRAPE DA VINCI XI COLUMN (DISPOSABLE) ×2
DRAPE SHEET LG 3/4 BI-LAMINATE (DRAPES) ×6 IMPLANT
DRAPE SURG IRRIG POUCH 19X23 (DRAPES) ×3 IMPLANT
ELECT REM PT RETURN 9FT ADLT (ELECTROSURGICAL) ×3
ELECTRODE REM PT RTRN 9FT ADLT (ELECTROSURGICAL) ×1 IMPLANT
GAUZE SPONGE 4X4 12PLY STRL (GAUZE/BANDAGES/DRESSINGS) ×3 IMPLANT
GLOVE BIO SURGEON STRL SZ 6.5 (GLOVE) ×4 IMPLANT
GLOVE BIO SURGEON STRL SZ7.5 (GLOVE) ×9 IMPLANT
GLOVE BIO SURGEONS STRL SZ 6.5 (GLOVE) ×2
GLOVE INDICATOR 8.0 STRL GRN (GLOVE) ×6 IMPLANT
GOWN STRL NON-REIN LRG LVL3 (GOWN DISPOSABLE) ×3 IMPLANT
GOWN STRL REUS W/TWL XL LVL3 (GOWN DISPOSABLE) ×6 IMPLANT
HOLDER FOLEY CATH W/STRAP (MISCELLANEOUS) ×3 IMPLANT
KIT BASIN OR (CUSTOM PROCEDURE TRAY) ×3 IMPLANT
MANIPULATOR UTERINE 4.5 ZUMI (MISCELLANEOUS) IMPLANT
MARKER SKIN DUAL TIP RULER LAB (MISCELLANEOUS) ×3 IMPLANT
NS IRRIG 1000ML POUR BTL (IV SOLUTION) ×3 IMPLANT
OCCLUDER COLPOPNEUMO (BALLOONS) ×3 IMPLANT
PACK GENERAL/GYN (CUSTOM PROCEDURE TRAY) ×3 IMPLANT
PORT ACCESS TROCAR AIRSEAL 12 (TROCAR) ×1 IMPLANT
PORT ACCESS TROCAR AIRSEAL 5M (TROCAR) ×2
POUCH SPECIMEN RETRIEVAL 10MM (ENDOMECHANICALS) IMPLANT
SEAL CANN UNIV 5-8 DVNC XI (MISCELLANEOUS) ×4 IMPLANT
SEAL XI 5MM-8MM UNIVERSAL (MISCELLANEOUS) ×8
SET TRI-LUMEN FLTR TB AIRSEAL (TUBING) ×3 IMPLANT
SET TUBE IRRIG SUCTION NO TIP (IRRIGATION / IRRIGATOR) ×3 IMPLANT
SHEET LAVH (DRAPES) ×3 IMPLANT
SOLUTION ELECTROLUBE (MISCELLANEOUS) ×3 IMPLANT
SPONGE LAP 18X18 X RAY DECT (DISPOSABLE) IMPLANT
SPONGE LAP 4X18 X RAY DECT (DISPOSABLE) ×6 IMPLANT
SUT MNCRL AB 4-0 PS2 18 (SUTURE) ×6 IMPLANT
SUT VIC AB 0 CT1 27 (SUTURE) ×2
SUT VIC AB 0 CT1 27XBRD ANTBC (SUTURE) ×1 IMPLANT
SUT VLOC 180 0 9IN  GS21 (SUTURE)
SUT VLOC 180 0 9IN GS21 (SUTURE) IMPLANT
SYR 50ML LL SCALE MARK (SYRINGE) ×3 IMPLANT
TOWEL OR 17X26 10 PK STRL BLUE (TOWEL DISPOSABLE) ×3 IMPLANT
TOWEL OR NON WOVEN STRL DISP B (DISPOSABLE) ×3 IMPLANT
TRAP SPECIMEN MUCOUS 40CC (MISCELLANEOUS) IMPLANT
TRAY FOLEY W/METER SILVER 14FR (SET/KITS/TRAYS/PACK) ×3 IMPLANT
TRAY LAPAROSCOPIC (CUSTOM PROCEDURE TRAY) ×3 IMPLANT
TROCAR BLADELESS OPT 5 100 (ENDOMECHANICALS) ×3 IMPLANT
WATER STERILE IRR 1500ML POUR (IV SOLUTION) IMPLANT

## 2015-07-13 NOTE — Anesthesia Procedure Notes (Signed)
Procedure Name: Intubation Date/Time: 07/13/2015 10:58 AM Performed by: Deliah Boston Pre-anesthesia Checklist: Patient identified, Emergency Drugs available, Suction available and Patient being monitored Patient Re-evaluated:Patient Re-evaluated prior to inductionOxygen Delivery Method: Circle System Utilized Preoxygenation: Pre-oxygenation with 100% oxygen Intubation Type: IV induction Ventilation: Mask ventilation without difficulty Laryngoscope Size: Mac and 3 Grade View: Grade I Tube type: Oral Tube size: 7.5 mm Number of attempts: 1 Airway Equipment and Method: Stylet and Oral airway Placement Confirmation: ETT inserted through vocal cords under direct vision,  positive ETCO2 and breath sounds checked- equal and bilateral Secured at: 21 cm Tube secured with: Tape Dental Injury: Teeth and Oropharynx as per pre-operative assessment

## 2015-07-13 NOTE — Anesthesia Preprocedure Evaluation (Signed)
Anesthesia Evaluation  Patient identified by MRN, date of birth, ID band Patient awake    Reviewed: Allergy & Precautions, NPO status , Patient's Chart, lab work & pertinent test results  History of Anesthesia Complications Negative for: history of anesthetic complications  Airway Mallampati: II  TM Distance: >3 FB Neck ROM: Full    Dental  (+) Chipped, Poor Dentition, Missing   Pulmonary neg shortness of breath, neg sleep apnea, neg COPD, neg recent URI, former smoker,    breath sounds clear to auscultation       Cardiovascular negative cardio ROS   Rhythm:Regular     Neuro/Psych  Headaches, neg Seizures negative psych ROS   GI/Hepatic negative GI ROS, Neg liver ROS,   Endo/Other  negative endocrine ROS  Renal/GU negative Renal ROS     Musculoskeletal   Abdominal   Peds  Hematology negative hematology ROS (+)   Anesthesia Other Findings   Reproductive/Obstetrics                             Anesthesia Physical Anesthesia Plan  ASA: II  Anesthesia Plan: General   Post-op Pain Management:    Induction: Intravenous  Airway Management Planned: Oral ETT  Additional Equipment: None  Intra-op Plan:   Post-operative Plan: Extubation in OR  Informed Consent: I have reviewed the patients History and Physical, chart, labs and discussed the procedure including the risks, benefits and alternatives for the proposed anesthesia with the patient or authorized representative who has indicated his/her understanding and acceptance.   Dental advisory given  Plan Discussed with: CRNA and Surgeon  Anesthesia Plan Comments:         Anesthesia Quick Evaluation

## 2015-07-13 NOTE — Interval H&P Note (Signed)
History and Physical Interval Note:  07/13/2015 10:14 AM  Kimberly Griffith  has presented today for surgery, with the diagnosis of ENDOMETRIAL CANCER  The various methods of treatment have been discussed with the patient and family. After consideration of risks, benefits and other options for treatment, the patient has consented to  Procedure(s): XI ROBOTIC [place] (Bilateral) SENTINEL LYMPH NODE BIOPSY (Bilateral) as a surgical intervention .  The patient's history has been reviewed, patient examined, no change in status, stable for surgery.  I have reviewed the patient's chart and labs.  Questions were answered to the patient's satisfaction.     [place], [facility]

## 2015-07-13 NOTE — Anesthesia Postprocedure Evaluation (Signed)
Anesthesia Post Note  Patient: Kimberly Griffith  Procedure(s) Performed: Procedure(s) (LRB): XI ROBOTIC ASSISTED LAPAROSCOPIC TOTAL HYSTERECTOMY WITH BILATERAL SALPINGO OOPHORECTOMY (Bilateral) SENTINEL LYMPH NODE BIOPSY (Bilateral)  Patient location during evaluation: PACU Anesthesia Type: General Level of consciousness: awake and alert Pain management: pain level controlled Vital Signs Assessment: post-procedure vital signs reviewed and stable Respiratory status: spontaneous breathing, nonlabored ventilation, respiratory function stable and patient connected to nasal cannula oxygen Cardiovascular status: blood pressure returned to baseline and stable Postop Assessment: no signs of nausea or vomiting Anesthetic complications: no    Last Vitals:  Filed Vitals:   07/13/15 1345 07/13/15 1400  BP: 119/64 119/63  Pulse: 59 56  Temp:    Resp: 13 12    Last Pain:  Filed Vitals:   07/13/15 1402  PainSc: 4                  Herberto Ledwell DAVID

## 2015-07-13 NOTE — H&P (View-Only) (Signed)
Consult Note: Gyn-Onc  Consult was requested by Dr. Quincy Simmonds for the evaluation of Kimberly Griffith 60 y.o. female  CC:  Chief Complaint  Patient presents with  . New Consultation    Endometrial cancer    Assessment/Plan:  Ms. Kimberly Griffith  is a 60 y.o.  year old with grade 1 endometrial cancer.   A detailed discussion was held with the patient and her family with regard to to her endometrial cancer diagnosis. We discussed the standard management options for uterine cancer which includes surgery followed possibly by adjuvant therapy depending on the results of surgery. The options for surgical management include a hysterectomy and removal of the tubes and ovaries possibly with removal of pelvic and para-aortic lymph nodes. A minimally invasive approach including a robotic hysterectomy or laparoscopic hysterectomy have benefits including shorter hospital stay, recovery time and better wound healing. The alternative approach is an open hysterectomy. The patient has been counseled about these surgical options and the risks of surgery in general including infection, bleeding, damage to surrounding structures (including bowel, bladder, ureters, nerves or vessels), and the postoperative risks of PE/ DVT, and lymphedema. I extensively reviewed the additional risks of robotic hysterectomy including possible need for conversion to open laparotomy.  I discussed positioning during surgery of trendelenberg and risks of minor facial swelling and care we take in preoperative positioning.  After counseling and consideration of her options, she desires to proceed with robotic hysterectomy, BSO, sentinel lymph node biopsy.   She will be seen by anesthesia for preoperative clearance and discussion of postoperative pain management.  She was given the opportunity to ask questions, which were answered to her satisfaction, and she is agreement with the above mentioned plan of care.   HPI: Kimberly Griffith is a 60 year old woman  (P6) who is seen in consultation at the request of Dr Quincy Simmonds for grade 1 endometrial cancer.  She has been having postmenopausal spotting since she was 60 when she passed through menopause. It was lightl. She reported this to Dr Cathie Olden at her May, 2017 gynecologic exam. This prompted Dr Quincy Simmonds to perform an endometrial pipelle biopsy.  The biopsy revealed grade 1 endometrial cancer.  She is otherwise very healthy, though obese (with a bmi of 35kg/m2). She has had no prior abdominal surgeries.  She has delivered 6 babies vaginally.   Her pap from 2017 showed ASCUS but negative for high risk HPV.  Current Meds:  Outpatient Encounter Prescriptions as of 07/05/2015  Medication Sig  . nitrofurantoin, macrocrystal-monohydrate, (MACROBID) 100 MG capsule Take 1 capsule (100 mg total) by mouth at bedtime.  . Probiotic Product (PROBIOTIC DAILY PO) Take 1 tablet by mouth daily.  . [DISCONTINUED] amoxicillin-clavulanate (AUGMENTIN) 875-125 MG tablet Take 1 tablet by mouth 2 (two) times daily.   No facility-administered encounter medications on file as of 07/05/2015.    Allergy: No Known Allergies  Social Hx:   Social History   Social History  . Marital Status: Married    Spouse Name: N/A  . Number of Children: N/A  . Years of Education: N/A   Occupational History  . Not on file.   Social History Main Topics  . Smoking status: Former Smoker    Types: Cigarettes    Quit date: 07/04/1984  . Smokeless tobacco: Former Systems developer    Quit date: 02/21/1984  . Alcohol Use: 0.0 oz/week    0 Standard drinks or equivalent per week     Comment: one drink/month  . Drug Use: No  .  Sexual Activity:    Partners: Male    Birth Control/ Protection: Post-menopausal   Other Topics Concern  . Not on file   Social History Narrative    Past Surgical Hx:  Past Surgical History  Procedure Laterality Date  . Tonsillectomy and adenoidectomy    . Wisdom tooth extraction      Past Medical Hx:  Past  Medical History  Diagnosis Date  . Allergy     kiwi  . History of kidney stones 2008  . Blood transfusion without reported diagnosis 1993    Dx'd with E.Coli    Past Gynecological History:  P6Patient's last menstrual period was 02/21/2011 (approximate).  Family Hx:  Family History  Problem Relation Age of Onset  . Hypertension Mother   . Hypertension Maternal Grandmother   . Stroke Maternal Grandmother   . Heart disease Paternal Grandfather   . Other Father     dec unknown cause age 60  . Hypertension Father     Review of Systems:  Constitutional  Feels well,    ENT Normal appearing ears and nares bilaterally Skin/Breast  No rash, sores, jaundice, itching, dryness Cardiovascular  No chest pain, shortness of breath, or edema  Pulmonary  No cough or wheeze.  Gastro Intestinal  No nausea, vomitting, or diarrhoea. No bright red blood per rectum, no abdominal pain, change in bowel movement, or constipation.  Genito Urinary  No frequency, urgency, dysuria,  Musculo Skeletal  No myalgia, arthralgia, joint swelling or pain  Neurologic  No weakness, numbness, change in gait,  Psychology  No depression, anxiety, insomnia.   Vitals:  Blood pressure 136/76, pulse 74, temperature 98.4 F (36.9 C), temperature source Oral, resp. rate 18, height 5\' 4"  (1.626 m), weight 205 lb 8 oz (93.214 kg), last menstrual period 02/21/2011, SpO2 100 %.  Physical Exam: WD in NAD Neck  Supple NROM, without any enlargements.  Lymph Node Survey No cervical supraclavicular or inguinal adenopathy Cardiovascular  Pulse normal rate, regularity and rhythm. S1 and S2 normal.  Lungs  Clear to auscultation bilateraly, without wheezes/crackles/rhonchi. Good air movement.  Skin  No rash/lesions/breakdown  Psychiatry  Alert and oriented to person, place, and time  Abdomen  Normoactive bowel sounds, abdomen soft, non-tender and obese without evidence of hernia.  Back No CVA tenderness Genito  Urinary  Vulva/vagina: Normal external female genitalia. No lesions. No discharge or bleeding.  Bladder/urethra:  No lesions or masses, well supported bladder  Vagina: normal  Cervix: Normal appearing, no lesions.  Uterus: Small, mobile, no parametrial involvement or nodularity.  Adnexa: no masses. Rectal  Good tone, no masses no cul de sac nodularity.  Extremities  No bilateral cyanosis, clubbing or edema.   Donaciano Eva, MD  07/05/2015, 11:52 AM

## 2015-07-13 NOTE — Op Note (Signed)
OPERATIVE NOTE  Preoperative Diagnosis: Grade 1 endometrial cancer  Postoperative Diagnosis: Grade 1 endometrial cancer  Procedure(s) Performed: Robotic total laparoscopic hysterectomy, Bilateral salpingo oophorectomy,  Bilateral sentinel LN sampling.    Surgeon: Francetta Found.  Skeet Latch, M.D. PhD  Assistant Surgeon:Lisa Delsa Sale MD.   Specimens: Uterus cervix ovaries, tubes, bilateral sentinel LN  Estimated Blood Loss:minimal cc Complications: None  Indication for Procedure: This is a 60 y.o.  who underwent prior endometrial biopsy demonstrating grade 1 endometrial cancer.  Operative Findings:  9cm uterus.normall adnexa. Normal appendix. No evidence of extraperitoneal disease.  Procedure: Patient was taken to the operating room and placed under general endotracheal anesthesia without any difficulty. She is placed in the dorsal lithotomy position and secured to the operative table over the chest with tape.   The patient was prepped and draped.  Iso- cyanine green was injected into the cervical submucosa and stroma at 3 and 9 o'clock and the uterine manipulator placed within the endometrial cavity. The appropriately sized Koh ring was circumferentially around the cervix. The balloon was placed within the vagina. An OG tube was present and functional. At an area on the left in line with the nipple approximately 2 cm below a 5 mm Optiview inserted under direct visualization. The abdomen was insufflated to 15 mm of mercury and the pressure never deviated above that throughout the remainder of the procedure. Maximum Trendelenburg positioning was obtained. At approximately 22 cm proximal to the symphysis pubis an incision was made just superior to the umbilicus. This area was infiltrated with lidocaine as well as the location 10 cm lateral to this incision and 2 cm superior to the left anterior superior iliac spine. Incisions were made. 8 mm trocar was inserted in the superior umbilicus incision. 8  millimeter robotic ports were placed in the other 3 incisions. The left upper quadrant port site was replaced with a 12 mm port. This was all completed under direct visualization. The small and large bowel were reflected as much as possible into the upper abdomen. The robot was docked and instruments placed.  The right round ligament was transected and the ureter was identified. The right sentinel LN was identified lateral to the external iliac artery and removed.  The right infundibulopelvic ligament was cauterized and transected The retroperitoneal space was entered on the right and the peritoneum incised to the level of the vesicouterine ligament anteriorly. The bladder flap was created using Bovie cautery. The peritoneal dissection was continued inferiorly and across the inferior most aspect of the cervix. In this manner the urethra was deflected inferiorly. The bladder flap was further developed. The uterine vessels on the right were skeletonized ligated and transected.  The left round ligament was transected laterally, the vesicovaginal and rectovaginal spaces were developed.  The left sentinel LN was identified medial to the external iliac vein, it was dissected free from the surrounding structures. The left ureter was identified. The left gonadal vessels were cauterized and transected. The broad ligament was skeletonized posteriorly to the level of the cervix and the peritoneum dissected free from the cervix and in this fashion the ureter was deflected inferiorly. The anterior peritoneum was further dissected and the bladder flap appropriately developed. The uterine vessels were skeletonized cauterized and transected. The balloon and the vagina was then maximally insufflated. A colpotomy incision was made circumferentially and the uterus cervix ovaries and tubes were ivered from the vagina. The Koh ring was removed and the balloon was replaced.  The pelvis was copiously irrigated  and drained and  hemostasis was assured. The vaginal cuff was closed with a running 0 Vicryl suture ligature. The needle was removed under direct visualization. The operative site is once again visualized and hemostasis was assured. The instruments were removed from the abdomen and pelvis and the port sites irrigated. The LUQ subcutaneous tissue was closed with an interrupted 0 Vicryl  suture.  Skin incisions were closed with a subcuticular suture. Dermabond was placed over the incisions.  The vaginal vault was cleared with a moist sponge stick.  Sponge, lap and needle counts were correct x 3.    The patient had sequential compression devices and preoperative Lovenox for VTE prophylaxis and will receive Lovenox postoperatively.          Disposition: PACU - hemodynamically stable.         Condition:stable Foley draining clear urine.

## 2015-07-13 NOTE — Transfer of Care (Signed)
Immediate Anesthesia Transfer of Care Note  Patient: Kimberly Griffith  Procedure(s) Performed: Procedure(s): XI ROBOTIC ASSISTED LAPAROSCOPIC TOTAL HYSTERECTOMY WITH BILATERAL SALPINGO OOPHORECTOMY (Bilateral) SENTINEL LYMPH NODE BIOPSY (Bilateral)  Patient Location: PACU  Anesthesia Type:General  Level of Consciousness: Patient easily awoken, sedated, comfortable, cooperative, following commands, responds to stimulation.   Airway & Oxygen Therapy: Patient spontaneously breathing, ventilating well, oxygen via simple oxygen mask.  Post-op Assessment: Report given to PACU RN, vital signs reviewed and stable, moving all extremities.   Post vital signs: Reviewed and stable.  Complications: No apparent anesthesia complications

## 2015-07-14 DIAGNOSIS — C541 Malignant neoplasm of endometrium: Secondary | ICD-10-CM | POA: Diagnosis not present

## 2015-07-14 LAB — BASIC METABOLIC PANEL
Anion gap: 3 — ABNORMAL LOW (ref 5–15)
BUN: 9 mg/dL (ref 6–20)
CALCIUM: 8.5 mg/dL — AB (ref 8.9–10.3)
CO2: 26 mmol/L (ref 22–32)
Chloride: 109 mmol/L (ref 101–111)
Creatinine, Ser: 0.58 mg/dL (ref 0.44–1.00)
GFR calc Af Amer: >60 mL/min (ref >60)
GLUCOSE: 139 mg/dL — AB (ref 65–99)
POTASSIUM: 4.3 mmol/L (ref 3.5–5.1)
Sodium: 138 mmol/L (ref 135–145)

## 2015-07-14 LAB — CBC
HCT: 37.2 % (ref 36.0–46.0)
Hemoglobin: 12.3 g/dL (ref 12.0–15.0)
MCH: 28.5 pg (ref 26.0–34.0)
MCHC: 33.1 g/dL (ref 30.0–36.0)
MCV: 86.1 fL (ref 78.0–100.0)
PLATELETS: 209 10*3/uL (ref 150–400)
RBC: 4.32 MIL/uL (ref 3.87–5.11)
RDW: 13.8 % (ref 11.5–15.5)
WBC: 12.1 10*3/uL — ABNORMAL HIGH (ref 4.0–10.5)

## 2015-07-14 MED ORDER — OXYCODONE-ACETAMINOPHEN 5-325 MG PO TABS: 1.0000 | ORAL_TABLET | ORAL | Status: DC | PRN

## 2015-07-14 MED ORDER — TRAMADOL HCL 50 MG PO TABS: 50.0000 mg | ORAL_TABLET | Freq: Four times a day (QID) | ORAL | Status: DC | PRN

## 2015-07-14 NOTE — Discharge Summary (Signed)
Physician Discharge Summary  Patient ID: Kimberly Griffith MRN: [id number] DOB/AGE: [birth year] 60 y.o.  Admit date: 07/13/2015 Discharge date: 07/14/2015  Admission Diagnoses: Endometrial cancer [facility])  Discharge Diagnoses:  Principal Problem:   Endometrial cancer [facility])   Discharged Condition:  The patient is in good condition and stable for discharge.    Hospital Course: On 07/13/2015, the patient underwent the following: Procedure(s): XI ROBOTIC ASSISTED LAPAROSCOPIC TOTAL HYSTERECTOMY WITH BILATERAL SALPINGO OOPHORECTOMY SENTINEL LYMPH NODE BIOPSY.  The postoperative course was uneventful.  She was discharged to home on postoperative day 1 tolerating a regular diet, voiding, minimal pain, passing flatus, ambulating without difficulty.  Consults: None  Significant Diagnostic Studies: None  Treatments: surgery: see above  Discharge Exam: Blood pressure 117/61, pulse 57, temperature 97.8 F (36.6 C), temperature source Oral, resp. rate 16, height 5\' 4"  (1.626 m), weight 205 lb (92.987 kg), last menstrual period 02/21/2011, SpO2 98 %. General appearance: alert, cooperative and no distress Resp: clear to auscultation bilaterally Cardio: regular rate and rhythm, S1, S2 normal, no murmur, click, rub or gallop GI: soft, non-tender; bowel sounds normal; no masses,  no organomegaly Extremities: extremities normal, atraumatic, no cyanosis or edema Incision/Wound: Lap sites to the abdomen without erythema or drainage Minimal amount of vaginal bleeding noted  Disposition: Home      Discharge Instructions    Call MD for:  difficulty breathing, headache or visual disturbances    Complete by:  As directed      Call MD for:  extreme fatigue    Complete by:  As directed      Call MD for:  hives    Complete by:  As directed      Call MD for:  persistant dizziness or light-headedness    Complete by:  As directed      Call MD for:  persistant nausea and vomiting    Complete by:  As directed      Call MD for:  redness, tenderness, or signs of infection (pain, swelling, redness, odor or green/yellow discharge around incision site)    Complete by:  As directed      Call MD for:  severe uncontrolled pain    Complete by:  As directed      Call MD for:  temperature >100.4    Complete by:  As directed      Diet - low sodium heart healthy    Complete by:  As directed      Driving Restrictions    Complete by:  As directed   No driving for 1 week.  Do not take narcotics and drive.     Increase activity slowly    Complete by:  As directed      Lifting restrictions    Complete by:  As directed   No lifting greater than 10 lbs.     Sexual Activity Restrictions    Complete by:  As directed   No sexual activity, nothing in the vagina, for 8 weeks.            Medication List    STOP taking these medications        nitrofurantoin (macrocrystal-monohydrate) 100 MG capsule  Commonly known as:  MACROBID      TAKE these medications        LACTAID PO  Take 1 tablet by mouth daily as needed (For lactose intolerance.).     oxyCODONE-acetaminophen 5-325 MG tablet  Commonly known as:  PERCOCET/ROXICET  Take 1-2 tablets by mouth  every 4 (four) hours as needed (moderate to severe pain).     PROBIOTIC DAILY PO  Take 1 tablet by mouth daily as needed (For digestive support.).         Greater than thirty minutes were spend for face to face discharge instructions and discharge orders/summary in EPIC.   Signed: Shaqueta Casady DEAL 07/14/2015, 10:49 AM

## 2015-07-14 NOTE — Discharge Instructions (Signed)
07/14/2015  Return to work: 4-6 weeks if applicable  Activity: 1. Be up and out of the bed during the day.  Take a nap if needed.  You may walk up steps but be careful and use the hand rail.  Stair climbing will tire you more than you think, you may need to stop part way and rest.   2. No lifting or straining for 6 weeks.  3. No driving for 1 week(s).  Do not drive if you are taking narcotic pain medicine.  4. Shower daily.  Use soap and water on your incision and pat dry; don't rub.  No tub baths until cleared by your surgeon.   5. No sexual activity and nothing in the vagina for 8 weeks.  6. You may experience a small amount of clear drainage from your incisions, which is normal.  If the drainage persists or increases, please call the office.   Diet: 1. Low sodium Heart Healthy Diet is recommended.  2. It is safe to use a laxative, such as Miralax or Colace, if you have difficulty moving your bowels.   Wound Care: 1. Keep clean and dry.  Shower daily.  Reasons to call the Doctor:  Fever - Oral temperature greater than 100.4 degrees Fahrenheit  Foul-smelling vaginal discharge  Difficulty urinating  Nausea and vomiting  Increased pain at the site of the incision that is unrelieved with pain medicine.  Difficulty breathing with or without chest pain  New calf pain especially if only on one side  Sudden, continuing increased vaginal bleeding with or without clots.   Contacts: For questions or concerns you should contact:  Dr. Everitt Amber or Dr. Janie Morning at [phone].  Dr. Skeet Latch also available at [phone] at [facility], NP at [phone]  After Hours: call [phone] and have the GYN Oncologist paged/contacted  Abdominal Hysterectomy, Care After These instructions give you information on caring for yourself after your procedure. Your doctor may also give you more specific instructions. Call your doctor if you have any problems or questions  after your procedure.  HOME CARE It takes 4-6 weeks to recover from this surgery. Follow all of your doctor's instructions.   Only take medicines as told by your doctor.  Take showers for 2-3 weeks. Ask your doctor when it is okay to shower.  Do not douche, use tampons, or have sex (intercourse) for at least 6 weeks or as told.  Follow your doctor's advice about exercise, lifting objects, driving, and general activities.  Get plenty of rest and sleep.  Do not lift anything heavier than a gallon of milk (about 10 pounds [4.5 kilograms]) for the first month after surgery.  Get back to your normal diet as told by your doctor.  Do not drink alcohol until your doctor says it is okay.  Take a medicine to help you poop (laxative) as told by your doctor.  Eating foods high in fiber may help you poop. Eat a lot of raw fruits and vegetables, whole grains, and beans.  Drink enough fluids to keep your pee (urine) clear or pale yellow.  Have someone help you at home for 1-2 weeks after your surgery.  Keep follow-up doctor visits as told. GET HELP IF:  You have chills or fever.  You have puffiness, redness, or pain in area of the cut (incision).  You have yellowish-white fluid (pus) coming from the cut.  You have a bad smell coming from the cut or bandage.  Your cut  pulls apart.  You feel dizzy or light-headed.  You have pain or bleeding when you pee.  You keep having watery poop (diarrhea).  You keep feeling sick to your stomach (nauseous) or keep throwing up (vomiting).  You have fluid (discharge) coming from your vagina.  You have a rash.  You have a reaction to your medicine.  You need stronger pain medicine. GET HELP RIGHT AWAY IF:   You have a fever and your symptoms suddenly get worse.  You have bad belly (abdominal) pain.  You have chest pain.  You are short of breath.  You pass out (faint).  You have pain, puffiness, or redness of your leg.  You bleed  a lot from your vagina and notice clumps of tissue (clots). MAKE SURE YOU:   Understand these instructions.  Will watch your condition.  Will get help right away if you are not doing well or get worse.   This information is not intended to replace advice given to you by your health care provider. Make sure you discuss any questions you have with your health care provider.   Document Released: 11/16/2007 Document Revised: 02/11/2013 Document Reviewed: 11/29/2012 Elsevier Interactive Patient Education 2016 [employer]  Acetaminophen; Oxycodone tablets What is this medicine? ACETAMINOPHEN; OXYCODONE (a set a MEE noe fen; ox i KOE done) is a pain reliever. It is used to treat moderate to severe pain. This medicine may be used for other purposes; ask your health care provider or pharmacist if you have questions. What should I tell my health care provider before I take this medicine? They need to know if you have any of these conditions: -brain tumor -Crohn's disease, inflammatory bowel disease, or ulcerative colitis -drug abuse or addiction -head injury -heart or circulation problems -if you often drink alcohol -kidney disease or problems going to the bathroom -liver disease -lung disease, asthma, or breathing problems -an unusual or allergic reaction to acetaminophen, oxycodone, other opioid analgesics, other medicines, foods, dyes, or preservatives -pregnant or trying to get pregnant -breast-feeding How should I use this medicine? Take this medicine by mouth with a full glass of water. Follow the directions on the prescription label. You can take it with or without food. If it upsets your stomach, take it with food. Take your medicine at regular intervals. Do not take it more often than directed. Talk to your pediatrician regarding the use of this medicine in children. Special care may be needed. Patients over 35 years old may have a stronger reaction and need a smaller  dose. Overdosage: If you think you have taken too much of this medicine contact a poison control center or emergency room at once. NOTE: This medicine is only for you. Do not share this medicine with others. What if I miss a dose? If you miss a dose, take it as soon as you can. If it is almost time for your next dose, take only that dose. Do not take double or extra doses. What may interact with this medicine? -alcohol -antihistamines -barbiturates like amobarbital, butalbital, butabarbital, methohexital, pentobarbital, phenobarbital, thiopental, and secobarbital -benztropine -drugs for bladder problems like solifenacin, trospium, oxybutynin, tolterodine, hyoscyamine, and methscopolamine -drugs for breathing problems like ipratropium and tiotropium -drugs for certain stomach or intestine problems like propantheline, homatropine methylbromide, glycopyrrolate, atropine, belladonna, and dicyclomine -general anesthetics like etomidate, ketamine, nitrous oxide, propofol, desflurane, enflurane, halothane, isoflurane, and sevoflurane -medicines for depression, anxiety, or psychotic disturbances -medicines for sleep -muscle relaxants -naltrexone -narcotic medicines (opiates) for pain -phenothiazines  like perphenazine, thioridazine, chlorpromazine, mesoridazine, fluphenazine, prochlorperazine, promazine, and trifluoperazine -scopolamine -tramadol -trihexyphenidyl This list may not describe all possible interactions. Give your health care provider a list of all the medicines, herbs, non-prescription drugs, or dietary supplements you use. Also tell them if you smoke, drink alcohol, or use illegal drugs. Some items may interact with your medicine. What should I watch for while using this medicine? Tell your doctor or health care professional if your pain does not go away, if it gets worse, or if you have new or a different type of pain. You may develop tolerance to the medicine. Tolerance means that you  will need a higher dose of the medication for pain relief. Tolerance is normal and is expected if you take this medicine for a long time. Do not suddenly stop taking your medicine because you may develop a severe reaction. Your body becomes used to the medicine. This does NOT mean you are addicted. Addiction is a behavior related to getting and using a drug for a non-medical reason. If you have pain, you have a medical reason to take pain medicine. Your doctor will tell you how much medicine to take. If your doctor wants you to stop the medicine, the dose will be slowly lowered over time to avoid any side effects. You may get drowsy or dizzy. Do not drive, use machinery, or do anything that needs mental alertness until you know how this medicine affects you. Do not stand or sit up quickly, especially if you are an older patient. This reduces the risk of dizzy or fainting spells. Alcohol may interfere with the effect of this medicine. Avoid alcoholic drinks. There are different types of narcotic medicines (opiates) for pain. If you take more than one type at the same time, you may have more side effects. Give your health care provider a list of all medicines you use. Your doctor will tell you how much medicine to take. Do not take more medicine than directed. Call emergency for help if you have problems breathing. The medicine will cause constipation. Try to have a bowel movement at least every 2 to 3 days. If you do not have a bowel movement for 3 days, call your doctor or health care professional. Do not take Tylenol (acetaminophen) or medicines that have acetaminophen with this medicine. Too much acetaminophen can be very dangerous. Many nonprescription medicines contain acetaminophen. Always read the labels carefully to avoid taking more acetaminophen. What side effects may I notice from receiving this medicine? Side effects that you should report to your doctor or health care professional as soon as  possible: -allergic reactions like skin rash, itching or hives, swelling of the face, lips, or tongue -breathing difficulties, wheezing -confusion -light headedness or fainting spells -severe stomach pain -unusually weak or tired -yellowing of the skin or the whites of the eyes Side effects that usually do not require medical attention (report to your doctor or health care professional if they continue or are bothersome): -dizziness -drowsiness -nausea -vomiting This list may not describe all possible side effects. Call your doctor for medical advice about side effects. You may report side effects to FDA at 1-800-FDA-1088. Where should I keep my medicine? Keep out of the reach of children. This medicine can be abused. Keep your medicine in a safe place to protect it from theft. Do not share this medicine with anyone. Selling or giving away this medicine is dangerous and against the law. This medicine may cause accidental overdose and death  if it taken by other adults, children, or pets. Mix any unused medicine with a substance like cat litter or coffee grounds. Then throw the medicine away in a sealed container like a sealed bag or a coffee can with a lid. Do not use the medicine after the expiration date. Store at room temperature between 20 and 25 degrees C (68 and 77 degrees F). NOTE: This sheet is a summary. It may not cover all possible information. If you have questions about this medicine, talk to your doctor, pharmacist, or health care provider.    2016, Elsevier/Gold Standard. (2014-01-07 15:18:46)  Tramadol tablets What is this medicine? TRAMADOL (TRA ma dole) is a pain reliever. It is used to treat moderate to severe pain in adults. This medicine may be used for other purposes; ask your health care provider or pharmacist if you have questions. What should I tell my health care provider before I take this medicine? They need to know if you have any of these conditions: -brain  tumor -depression -drug abuse or addiction -head injury -if you frequently drink alcohol containing drinks -kidney disease or trouble passing urine -liver disease -lung disease, asthma, or breathing problems -seizures or epilepsy -suicidal thoughts, plans, or attempt; a previous suicide attempt by you or a family member -an unusual or allergic reaction to tramadol, codeine, other medicines, foods, dyes, or preservatives -pregnant or trying to get pregnant -breast-feeding How should I use this medicine? Take this medicine by mouth with a full glass of water. Follow the directions on the prescription label. If the medicine upsets your stomach, take it with food or milk. Do not take more medicine than you are told to take. Talk to your pediatrician regarding the use of this medicine in children. Special care may be needed. Overdosage: If you think you have taken too much of this medicine contact a poison control center or emergency room at once. NOTE: This medicine is only for you. Do not share this medicine with others. What if I miss a dose? If you miss a dose, take it as soon as you can. If it is almost time for your next dose, take only that dose. Do not take double or extra doses. What may interact with this medicine? Do not take this medicine with any of the following medications: -MAOIs like Carbex, Eldepryl, Marplan, Nardil, and Parnate This medicine may also interact with the following medications: -alcohol or medicines that contain alcohol -antihistamines -benzodiazepines -bupropion -carbamazepine or oxcarbazepine -clozapine -cyclobenzaprine -digoxin -furazolidone -linezolid -medicines for depression, anxiety, or psychotic disturbances -medicines for migraine headache like almotriptan, eletriptan, frovatriptan, naratriptan, rizatriptan, sumatriptan, zolmitriptan -medicines for pain like pentazocine, buprenorphine, butorphanol, meperidine, nalbuphine, and  propoxyphene -medicines for sleep -muscle relaxants -naltrexone -phenobarbital -phenothiazines like perphenazine, thioridazine, chlorpromazine, mesoridazine, fluphenazine, prochlorperazine, promazine, and trifluoperazine -procarbazine -warfarin This list may not describe all possible interactions. Give your health care provider a list of all the medicines, herbs, non-prescription drugs, or dietary supplements you use. Also tell them if you smoke, drink alcohol, or use illegal drugs. Some items may interact with your medicine. What should I watch for while using this medicine? Tell your doctor or health care professional if your pain does not go away, if it gets worse, or if you have new or a different type of pain. You may develop tolerance to the medicine. Tolerance means that you will need a higher dose of the medicine for pain relief. Tolerance is normal and is expected if you take this medicine for  a long time. Do not suddenly stop taking your medicine because you may develop a severe reaction. Your body becomes used to the medicine. This does NOT mean you are addicted. Addiction is a behavior related to getting and using a drug for a non-medical reason. If you have pain, you have a medical reason to take pain medicine. Your doctor will tell you how much medicine to take. If your doctor wants you to stop the medicine, the dose will be slowly lowered over time to avoid any side effects. You may get drowsy or dizzy. Do not drive, use machinery, or do anything that needs mental alertness until you know how this medicine affects you. Do not stand or sit up quickly, especially if you are an older patient. This reduces the risk of dizzy or fainting spells. Alcohol can increase or decrease the effects of this medicine. Avoid alcoholic drinks. You may have constipation. Try to have a bowel movement at least every 2 to 3 days. If you do not have a bowel movement for 3 days, call your doctor or health care  professional. Your mouth may get dry. Chewing sugarless gum or sucking hard candy, and drinking plenty of water may help. Contact your doctor if the problem does not go away or is severe. What side effects may I notice from receiving this medicine? Side effects that you should report to your doctor or health care professional as soon as possible: -allergic reactions like skin rash, itching or hives, swelling of the face, lips, or tongue -breathing difficulties, wheezing -confusion -itching -light headedness or fainting spells -redness, blistering, peeling or loosening of the skin, including inside the mouth -seizures Side effects that usually do not require medical attention (report to your doctor or health care professional if they continue or are bothersome): -constipation -dizziness -drowsiness -headache -nausea, vomiting This list may not describe all possible side effects. Call your doctor for medical advice about side effects. You may report side effects to FDA at 1-800-FDA-1088. Where should I keep my medicine? Keep out of the reach of children. This medicine may cause accidental overdose and death if it taken by other adults, children, or pets. Mix any unused medicine with a substance like cat litter or coffee grounds. Then throw the medicine away in a sealed container like a sealed bag or a coffee can with a lid. Do not use the medicine after the expiration date. Store at room temperature between 15 and 30 degrees C (59 and 86 degrees F). NOTE: This sheet is a summary. It may not cover all possible information. If you have questions about this medicine, talk to your doctor, pharmacist, or health care provider.    2016, Elsevier/Gold Standard. (2013-04-04 15:42:09)

## 2015-07-15 ENCOUNTER — Telehealth: Payer: Self-pay | Admitting: Gynecologic Oncology

## 2015-07-15 NOTE — Telephone Encounter (Signed)
Attempted to call patient with final path results.  Left message asking her to please call the office.  Able to get in touch with patient.  Patient informed of final path.  Doing well post-op.  Advised to call for any questions or concerns.

## 2015-07-22 ENCOUNTER — Encounter (HOSPITAL_COMMUNITY): Payer: Self-pay

## 2015-07-22 ENCOUNTER — Telehealth: Payer: Self-pay | Admitting: Gynecologic Oncology

## 2015-07-22 NOTE — Telephone Encounter (Signed)
Patient called reporting a low grade temp of under 100 and intermittent chills.  No other symptoms reported.  No nausea, emesis, vaginal bleeding, moderate abd pain, cough.  Bowels and bladder functioning without difficulty.  Patient advised to monitor her symptoms and to call later today or in the am with an update.  Reportable signs and symptoms reviewed.  Advised to call for any issues or concerns as well.

## 2015-08-09 ENCOUNTER — Encounter: Payer: Self-pay | Admitting: Gynecologic Oncology

## 2015-08-09 ENCOUNTER — Ambulatory Visit: Payer: Managed Care, Other (non HMO) | Attending: Gynecologic Oncology | Admitting: Gynecologic Oncology

## 2015-08-09 VITALS — BP 151/78 | HR 74 | Temp 98.2°F | Resp 18 | Ht 64.0 in | Wt 215.6 lb

## 2015-08-09 DIAGNOSIS — Z90710 Acquired absence of both cervix and uterus: Secondary | ICD-10-CM | POA: Insufficient documentation

## 2015-08-09 DIAGNOSIS — C541 Malignant neoplasm of endometrium: Secondary | ICD-10-CM | POA: Diagnosis not present

## 2015-08-09 DIAGNOSIS — Z90722 Acquired absence of ovaries, bilateral: Secondary | ICD-10-CM | POA: Insufficient documentation

## 2015-08-09 DIAGNOSIS — Z9889 Other specified postprocedural states: Secondary | ICD-10-CM | POA: Diagnosis not present

## 2015-08-09 DIAGNOSIS — N952 Postmenopausal atrophic vaginitis: Secondary | ICD-10-CM | POA: Diagnosis not present

## 2015-08-09 MED ORDER — ESTROGENS, CONJUGATED 0.625 MG/GM VA CREA: 1.0000 | TOPICAL_CREAM | VAGINAL | Status: DC

## 2015-08-09 NOTE — Progress Notes (Signed)
POSTOPERATIVE FOLLOW-UP : ENDOMETRIAL CANCER  Assessment:    60 y.o. year old with Stage IA Grade 1 endometrioid endometrial cancer.   S/p robotic hysterectomy, BSO, SLN biopsy on 07/13/15. no LVSI, 20% myometrial invasion, and negative lymph nodes. Low risk factors for recurrence. No adjuvant therapy recommended.   Plan: 1) Pathology reports reviewed today 2) Treatment counseling - Very low risk (<5%) for recurrence given age, grade, depth of myometrial invasion and LVSI status. Multidisciplinary tumor board recommendation is for routine surveillance with frequent pelvic exams every 6 months x 5 years, at which time she can return to annual visits.  Discussed signs and symptoms of recurrence including vaginal bleeding or discharge, leg pain or swelling and changes in bowel or bladder habits. She was given the opportunity to ask questions, which were answered to her satisfaction, and she is agreement with the above mentioned plan of care.  3)  Return to clinic in 6 months to see Dr Skeet Latch or myself and in 12 months to see Dr Quincy Simmonds.  4) vaginal cuff atrophy and slow healing: vaginal premarin 3 times a week x 4 weeks.  HPI:  Kimberly Griffith is a 60 y.o. year old [id number] initially seen in consultation on 07/05/15 referred by Dr Quincy Simmonds for grade 1 endometrial cancer.  She then underwent a robotic hysterectomy, BSO, SLN biopsy with Dr Skeet Latch on [id number] without complications.  Her postoperative course was uncomplicated.  Her final pathologic diagnosis is a Stage IA Grade 1 endometrioid endometrial cancer with no lymphovascular space invasion, 3/13 mm (20%) of myometrial invasion and negative lymph nodes.  She is seen today for a postoperative check and to discuss her pathology results and treatment plan.  Since discharge from the hospital, she is feeling well.  She has improving appetite, normal bowel and bladder function, and pain controlled with minimal PO medication. She has persistent light spotting  daily. She has no other complaints today.  Review of systems: Constitutional:  She has no weight gain or weight loss. She has no fever or chills. Eyes: No blurred vision Ears, Nose, Mouth, Throat: No dizziness, headaches or changes in hearing. No mouth sores. Cardiovascular: No chest pain, palpitations or edema. Respiratory:  No shortness of breath, wheezing or cough Gastrointestinal: She has normal bowel movements without diarrhea or constipation. She denies any nausea or vomiting. She denies blood in her stool or heart burn. Genitourinary:  She denies pelvic pain, pelvic pressure or changes in her urinary function. She has no hematuria, dysuria, or incontinence. She has no irregular vaginal bleeding or vaginal discharge Musculoskeletal: Denies muscle weakness or joint pains.  Skin:  She has no skin changes, rashes or itching Neurological:  Denies dizziness or headaches. No neuropathy, no numbness or tingling. Psychiatric:  She denies depression or anxiety. Hematologic/Lymphatic:   No easy bruising or bleeding   Physical Exam: Blood pressure 151/78, pulse 74, temperature 98.2 F (36.8 C), temperature source Oral, resp. rate 18, height 5\' 4"  (1.626 m), weight 215 lb 9.6 oz (97.796 kg), last menstrual period 02/21/2011, SpO2 99 %. General: Well dressed, well nourished in no apparent distress.   HEENT:  Normocephalic and atraumatic, no lesions.  Extraocular muscles intact. Sclerae anicteric. Pupils equal, round, reactive. No mouth sores or ulcers. Thyroid is normal size, not nodular, midline. Abdomen:  Soft, nontender, nondistended.  No palpable masses.  No hepatosplenomegaly.  No ascites. Normal bowel sounds.  No hernias.  Incisions are well healed Genitourinary: Normal EGBUS  Vaginal cuff intact. No active bleeding or  discharge but there was blood tinge to the proctoswab. Cuff in tact and mucosa in tact.  No cul de sac fullness. Extremities: No cyanosis, clubbing or edema.  No calf tenderness  or erythema. No palpable cords. Psychiatric: Mood and affect are appropriate. Neurological: Awake, alert and oriented x 3. Sensation is intact, no neuropathy.  Musculoskeletal: No pain, normal strength and range of motion.   20 minutes of direct face to face counseling time was spent with the patient. This included discussion about prognosis, therapy recommendations and postoperative side effects and are beyond the scope of routine postoperative care.  Donaciano Eva, MD

## 2015-08-09 NOTE — Patient Instructions (Signed)
Plan to begin using vaginal estrogen three times a week in the vagina (the amount about the length of your fingernail) for one month to assist with healing from surgery and to nourish the vagina.  Plan to follow up with Dr. Denman George in six months and Dr. Quincy Simmonds in one year.  Please call for any questions, concerns, or new/persistent symptoms.

## 2015-08-10 MED ORDER — ESTROGENS, CONJUGATED 0.625 MG/GM VA CREA: 1.0000 | TOPICAL_CREAM | VAGINAL | Status: DC

## 2015-08-10 NOTE — Addendum Note (Signed)
Addended by: Joylene John D on: 08/10/2015 12:05 PM   Modules accepted: Orders

## 2015-09-16 ENCOUNTER — Emergency Department (INDEPENDENT_AMBULATORY_CARE_PROVIDER_SITE_OTHER)
Admission: EM | Admit: 2015-09-16 | Discharge: 2015-09-16 | Disposition: A | Discharge status: Home / Self Care | Payer: Managed Care, Other (non HMO) | Source: Home / Self Care | Attending: Family Medicine | Admitting: Family Medicine

## 2015-09-16 ENCOUNTER — Encounter: Payer: Self-pay | Admitting: Emergency Medicine

## 2015-09-16 DIAGNOSIS — R300 Dysuria: Secondary | ICD-10-CM

## 2015-09-16 DIAGNOSIS — N3090 Cystitis, unspecified without hematuria: Secondary | ICD-10-CM | POA: Diagnosis not present

## 2015-09-16 LAB — POCT URINALYSIS DIP (MANUAL ENTRY)
BILIRUBIN UA: NEGATIVE
Glucose, UA: NEGATIVE
Ketones, POC UA: NEGATIVE
NITRITE UA: NEGATIVE
PH UA: 5.5 (ref 5–8)
PROTEIN UA: NEGATIVE
Spec Grav, UA: <=1.005 (ref 1.005–1.03)
UROBILINOGEN UA: 0.2 (ref 0–1)

## 2015-09-16 MED ORDER — NITROFURANTOIN MONOHYD MACRO 100 MG PO CAPS: ORAL_CAPSULE | ORAL | 0 refills | Status: DC

## 2015-09-16 MED ORDER — AMOXICILLIN-POT CLAVULANATE 875-125 MG PO TABS
1.0000 | ORAL_TABLET | Freq: Two times a day (BID) | ORAL | 0 refills | Status: DC
Start: 2015-09-16 — End: 2015-10-14

## 2015-09-16 NOTE — ED Provider Notes (Signed)
[facility]    CSN: [id number] Arrival date & time: 09/16/15  [id number]  First Provider Contact:  First MD Initiated Contact with Patient 09/16/15 1953        History   Chief Complaint Chief Complaint  Patient presents with  . Dysuria    HPI Kimberly Griffith is a 60 y.o. female.   Patient complains of 2 day history of dysuria, frequency, and hesitancy.  She has a history of recurrent UTI's, having been on prophylactic nitrofurantoin (not taken for the past month).  She has not had a urologic work-up for her UTI's.  She has a history of endometrial CA, s/p laparoscopic hysterectomy and BSO in May 2017.   The history is provided by the patient.  Dysuria  Pain quality:  Burning Pain severity:  Mild Onset quality:  Sudden Duration:  2 days Timing:  Constant Progression:  Worsening Chronicity:  Recurrent Recent urinary tract infections: yes   Relieved by:  Nothing Worsened by:  Nothing Ineffective treatments:  None tried Urinary symptoms: frequent urination and hesitancy   Urinary symptoms: no discolored urine, no foul-smelling urine, no hematuria and no bladder incontinence   Associated symptoms: no abdominal pain, no fever, no flank pain, no genital lesions, no nausea, no vaginal discharge and no vomiting   Risk factors: recurrent urinary tract infections     Past Medical History:  Diagnosis Date  . Allergy    kiwi  . Blood transfusion without reported diagnosis 1993   Dx'd with E.Coli  . Cancer ([place]) 2017   endometrial  . Chronic kidney disease   . Headache   . History of kidney stones 2008  . Pneumonia 2015  . Tremor    left thumb    Patient Active Problem List   Diagnosis Date Noted  . Endometrial cancer ([place]) 07/05/2015    Past Surgical History:  Procedure Laterality Date  . LYMPH NODE BIOPSY Bilateral 07/13/2015   Procedure: SENTINEL LYMPH NODE BIOPSY;  Surgeon: Janie Morning, MD;  Location: [facility];  Service: Gynecology;  Laterality: Bilateral;    . ROBOTIC ASSISTED TOTAL HYSTERECTOMY WITH BILATERAL SALPINGO OOPHERECTOMY Bilateral 07/13/2015   Procedure: XI ROBOTIC ASSISTED LAPAROSCOPIC TOTAL HYSTERECTOMY WITH BILATERAL SALPINGO OOPHORECTOMY;  Surgeon: Janie Morning, MD;  Location: [facility];  Service: Gynecology;  Laterality: Bilateral;  . TONSILLECTOMY AND ADENOIDECTOMY    . WISDOM TOOTH EXTRACTION      OB History    Gravida Para Term Preterm AB Living   7 6     1 6    SAB TAB Ectopic Multiple Live Births   1               Home Medications    Prior to Admission medications   Medication Sig Start Date End Date Taking? Authorizing Provider  cephALEXin (KEFLEX) 500 MG capsule Take 1 capsule (500 mg total) by mouth 2 (two) times daily. 10/14/15 10/21/15  Nona Dell, PA-C  conjugated estrogens (PREMARIN) vaginal cream Place 1 Applicatorful vaginally 3 (three) times a week. Three times a week for one month 08/10/15   Dorothyann Gibbs, NP  Lactase (LACTAID PO) Take 1 tablet by mouth daily as needed (For lactose intolerance.).    Historical Provider, MD  nitrofurantoin, macrocrystal-monohydrate, (MACROBID) 100 MG capsule Take 1 capsule (100 mg total) by mouth daily. 10/14/15   Nona Dell, PA-C  phenazopyridine (PYRIDIUM) 200 MG tablet Take 1 tablet (200 mg total) by mouth 3 (three) times daily. 10/14/15   Nona Dell,  PA-C  Probiotic Product (PROBIOTIC DAILY PO) Take 1 tablet by mouth daily as needed (For digestive support.). Reported on 08/09/2015    Historical Provider, MD  traMADol (ULTRAM) 50 MG tablet Take 1 tablet (50 mg total) by mouth every 6 (six) hours as needed (Do not take with percocet). Patient not taking: Reported on 08/09/2015 07/14/15   Dorothyann Gibbs, NP    Family History Family History  Problem Relation Age of Onset  . Hypertension Mother   . Hypertension Maternal Grandmother   . Stroke Maternal Grandmother   . Heart disease Paternal Grandfather   . Other Father     dec unknown  cause age 48  . Hypertension Father     Social History Social History  Substance Use Topics  . Smoking status: Former Smoker    Types: Cigarettes    Quit date: 07/04/1984  . Smokeless tobacco: Former Systems developer    Quit date: 02/21/1984  . Alcohol use 0.0 oz/week     Comment: one drink/month     Allergies   Kiwi extract   Review of Systems Review of Systems  Constitutional: Negative for fever.  Gastrointestinal: Negative for abdominal pain, nausea and vomiting.  Genitourinary: Positive for dysuria. Negative for flank pain and vaginal discharge.  All other systems reviewed and are negative.    Physical Exam Triage Vital Signs ED Triage Vitals  Enc Vitals Group     BP 09/16/15 1936 149/83     Pulse Rate 09/16/15 1936 69     Resp --      Temp 09/16/15 1936 97.8 F (36.6 C)     Temp Source 09/16/15 1936 Oral     SpO2 09/16/15 1936 98 %     Weight 09/16/15 1936 205 lb (93 kg)     Height 09/16/15 1936 5\' 4"  (1.626 m)     Head Circumference --      Peak Flow --      Pain Score 09/16/15 1939 4     Pain Loc --      Pain Edu? --      Excl. in [place]? --    No data found.   Updated Vital Signs BP 149/83 (BP Location: Left Arm)   Pulse 69   Temp 97.8 F (36.6 C) (Oral)   Ht 5\' 4"  (1.626 m)   Wt 205 lb (93 kg)   LMP 02/21/2011 (Approximate)   SpO2 98%   BMI 35.19 kg/m   Visual Acuity Right Eye Distance:   Left Eye Distance:   Bilateral Distance:    Right Eye Near:   Left Eye Near:    Bilateral Near:     Physical Exam Nursing notes and Vital Signs reviewed. Appearance:  Patient appears stated age, and in no acute distress.    Eyes:  Pupils are equal, round, and reactive to light and accomodation.  Extraocular movement is intact.  Conjunctivae are not inflamed   Pharynx:  Normal; moist mucous membranes  Neck:  Supple.  No adenopathy Lungs:  Clear to auscultation.  Breath sounds are equal.  Moving air well. Heart:  Regular rate and rhythm without murmurs, rubs, or  gallops.  Abdomen:  Nontender without masses or hepatosplenomegaly.  Bowel sounds are present.  No CVA or flank tenderness.  Extremities:  No edema.  Skin:  No rash present.     UC Treatments / Results  Labs (all labs ordered are listed, but only abnormal results are displayed) Labs Reviewed  POCT URINALYSIS DIP (MANUAL  ENTRY) - Abnormal; Notable for the following:       Result Value   Clarity, UA cloudy (*)    Blood, UA small (*)    Leukocytes, UA large (3+) (*)    All other components within normal limits  URINE CULTURE   Narrative:    Performed at:  [facility], [id number]                [place], [place] [address]    EKG  EKG Interpretation None       Radiology No results found.  Procedures Procedures (including critical care time)  Medications Ordered in UC Medications - No data to display   Initial Impression / Assessment and Plan / UC Course  I have reviewed the triage vital signs and the nursing notes.  Pertinent labs & imaging results that were available during my care of the patient were reviewed by me and considered in my medical decision making (see chart for details).  Clinical Course       Final Clinical Impressions(s) / UC Diagnoses   Final diagnoses:  Dysuria  Cystitis, recurrent   Urine culture pending. Begin Augmentin BID, and Macrobid at HS Increase fluid intake.  If symptoms become significantly worse during the night or over the weekend, proceed to the local emergency room.  Recommend follow-up with urologist New Prescriptions Discharge Medication List as of 09/16/2015  8:16 PM    START taking these medications   Details  amoxicillin-clavulanate (AUGMENTIN) 875-125 MG tablet Take 1 tablet by mouth 2 (two) times daily. Take with food, Starting Thu 09/16/2015, Print         Kandra Nicolas, MD 10/14/15 2239

## 2015-09-16 NOTE — ED Triage Notes (Signed)
Dysuria since yesterday 

## 2015-09-16 NOTE — Discharge Instructions (Addendum)
Increase fluid intake. °If symptoms become significantly worse during the night or over the weekend, proceed to the local emergency room.  °

## 2015-09-19 ENCOUNTER — Telehealth: Payer: Self-pay | Admitting: Emergency Medicine

## 2015-09-19 LAB — URINE CULTURE

## 2015-09-19 NOTE — Telephone Encounter (Signed)
Pt informed of urine culture results and to continue antbs as prescribed.  Pt states that the Augmentin caused diarrhea, which she has stopped.  Pt will continue Macrobid as prescribed, drink plenty of fluids.  If pt has any problems, she will contact our office or her PCP.  Kimberly Griffith

## 2015-09-19 NOTE — Telephone Encounter (Signed)
[id number]

## 2015-09-22 ENCOUNTER — Telehealth: Payer: Self-pay | Admitting: *Deleted

## 2015-09-22 MED ORDER — CEPHALEXIN 500 MG PO CAPS: 500.0000 mg | ORAL_CAPSULE | Freq: Two times a day (BID) | ORAL | 0 refills | Status: DC

## 2015-09-22 NOTE — Telephone Encounter (Signed)
Pt called reports that her UTI has not completed resolved after taking Macrobid. Per [facility] sent in rx for keflex. If she fails to improve she needs to f/u with her PCP. Pt notified.

## 2015-10-14 ENCOUNTER — Encounter (HOSPITAL_BASED_OUTPATIENT_CLINIC_OR_DEPARTMENT_OTHER): Payer: Self-pay | Admitting: *Deleted

## 2015-10-14 ENCOUNTER — Emergency Department (HOSPITAL_BASED_OUTPATIENT_CLINIC_OR_DEPARTMENT_OTHER)
Admission: EM | Admit: 2015-10-14 | Discharge: 2015-10-14 | Disposition: A | Discharge status: Home / Self Care | Payer: Managed Care, Other (non HMO) | Attending: Emergency Medicine | Admitting: Emergency Medicine

## 2015-10-14 DIAGNOSIS — Z79899 Other long term (current) drug therapy: Secondary | ICD-10-CM | POA: Diagnosis not present

## 2015-10-14 DIAGNOSIS — Z87891 Personal history of nicotine dependence: Secondary | ICD-10-CM | POA: Insufficient documentation

## 2015-10-14 DIAGNOSIS — N390 Urinary tract infection, site not specified: Secondary | ICD-10-CM | POA: Insufficient documentation

## 2015-10-14 DIAGNOSIS — Z8542 Personal history of malignant neoplasm of other parts of uterus: Secondary | ICD-10-CM | POA: Insufficient documentation

## 2015-10-14 LAB — URINALYSIS, ROUTINE W REFLEX MICROSCOPIC
BILIRUBIN URINE: NEGATIVE
GLUCOSE, UA: NEGATIVE mg/dL
KETONES UR: NEGATIVE mg/dL
Nitrite: POSITIVE — AB
PH: 6 (ref 5.0–8.0)
PROTEIN: 30 mg/dL — AB
Specific Gravity, Urine: 1.016 (ref 1.005–1.030)

## 2015-10-14 LAB — URINE MICROSCOPIC-ADD ON

## 2015-10-14 MED ORDER — PHENAZOPYRIDINE HCL 200 MG PO TABS: 200.0000 mg | ORAL_TABLET | Freq: Three times a day (TID) | ORAL | 0 refills | Status: DC

## 2015-10-14 MED ORDER — PHENAZOPYRIDINE HCL 100 MG PO TABS
200.0000 mg | ORAL_TABLET | Freq: Once | ORAL | Status: AC
  Administered 2015-10-14: 200 mg via ORAL
  Filled 2015-10-14: qty 2

## 2015-10-14 MED ORDER — NITROFURANTOIN MONOHYD MACRO 100 MG PO CAPS: 100.0000 mg | ORAL_CAPSULE | Freq: Every day | ORAL | 0 refills | Status: DC

## 2015-10-14 MED ORDER — CEPHALEXIN 500 MG PO CAPS: 500.0000 mg | ORAL_CAPSULE | Freq: Two times a day (BID) | ORAL | 0 refills | Status: AC

## 2015-10-14 MED ORDER — NITROFURANTOIN MONOHYD MACRO 100 MG PO CAPS: 100.0000 mg | ORAL_CAPSULE | Freq: Two times a day (BID) | ORAL | 0 refills | Status: DC

## 2015-10-14 NOTE — ED Triage Notes (Signed)
She thinks she has a UTI. This am she started having urgency, dysuria and abdominal pain.

## 2015-10-14 NOTE — Discharge Instructions (Signed)
Take your antibiotic as prescribed until completed. Please follow up with a primary care provider from the Resource Guide provided below in one week if your symptoms have not improved. Please return to the Emergency Department if symptoms worsen or new onset of fever, back/flank pain, abdominal pain, vomiting, difficulty urinating, blood in urine.

## 2015-10-14 NOTE — ED Notes (Signed)
Per Pt. No Nausea or vomiting.  Pt. Reports always diarrhea and nothing new.

## 2015-10-14 NOTE — ED Notes (Addendum)
Per Pt. Symptomology started this morning.  Pt reports frequent UTIs

## 2015-10-14 NOTE — ED Notes (Signed)
MD at bedside. 

## 2015-10-14 NOTE — ED Provider Notes (Signed)
[facility] Provider Note   CSN: [id number] Arrival date & time: 10/14/15  1728   By signing my name below, I, Estanislado Pandy, attest that this documentation has been prepared under the direction and in the presence of Enrique Sack, PA-C. Electronically Signed: Estanislado Pandy, Scribe. 10/14/2015. 5:54 PM.   History   Chief Complaint Chief Complaint  Patient presents with  . Urinary Tract Infection    The history is provided by the patient. No language interpreter was used.   HPI Comments:  Kimberly Griffith is a 60 y.o. female with with PSHx of hysterectomy PMHx of CKD, kidney stones, endometrial cancer, and  multiple UTIs who presents to the Emergency Department complaining of sudden onset, constant UTI symptoms that began this morning. Pt complains of associated frequency, urgency, dysuria, cloudy urine, and lower pelvic pain when urinating. Pt notes that she feels a "burning" sensation when urinating. Pt notes that she took azo without relief.  Pt denies hematuria, fever, chills, myalgia, nausea, vomiting, vaginal bleeding/discharge, flank pain and chest pain. Pt falso denies Hx of DM or HTN. Patient reports history of frequent UTIs and notes she takes Macrobid once daily which she was started on a few months ago by her PCP.   Past Medical History:  Diagnosis Date  . Allergy    kiwi  . Blood transfusion without reported diagnosis 1993   Dx'd with E.Coli  . Cancer ([place]) 2017   endometrial  . Chronic kidney disease   . Headache   . History of kidney stones 2008  . Pneumonia 2015  . Tremor    left thumb    Patient Active Problem List   Diagnosis Date Noted  . Endometrial cancer ([place]) 07/05/2015    Past Surgical History:  Procedure Laterality Date  . LYMPH NODE BIOPSY Bilateral 07/13/2015   Procedure: SENTINEL LYMPH NODE BIOPSY;  Surgeon: Janie Morning, MD;  Location: [facility];  Service: Gynecology;  Laterality: Bilateral;  . ROBOTIC ASSISTED TOTAL HYSTERECTOMY  WITH BILATERAL SALPINGO OOPHERECTOMY Bilateral 07/13/2015   Procedure: XI ROBOTIC ASSISTED LAPAROSCOPIC TOTAL HYSTERECTOMY WITH BILATERAL SALPINGO OOPHORECTOMY;  Surgeon: Janie Morning, MD;  Location: [facility];  Service: Gynecology;  Laterality: Bilateral;  . TONSILLECTOMY AND ADENOIDECTOMY    . WISDOM TOOTH EXTRACTION      OB History    Gravida Para Term Preterm AB Living   7 6     1 6    SAB TAB Ectopic Multiple Live Births   1               Home Medications    Prior to Admission medications   Medication Sig Start Date End Date Taking? Authorizing Provider  Lactase (LACTAID PO) Take 1 tablet by mouth daily as needed (For lactose intolerance.).   Yes Historical Provider, MD  Probiotic Product (PROBIOTIC DAILY PO) Take 1 tablet by mouth daily as needed (For digestive support.). Reported on 08/09/2015   Yes Historical Provider, MD  cephALEXin (KEFLEX) 500 MG capsule Take 1 capsule (500 mg total) by mouth 2 (two) times daily. 10/14/15 10/21/15  Nona Dell, PA-C  conjugated estrogens (PREMARIN) vaginal cream Place 1 Applicatorful vaginally 3 (three) times a week. Three times a week for one month 08/10/15   Dorothyann Gibbs, NP  nitrofurantoin, macrocrystal-monohydrate, (MACROBID) 100 MG capsule Take 1 capsule (100 mg total) by mouth daily. 10/14/15   Nona Dell, PA-C  phenazopyridine (PYRIDIUM) 200 MG tablet Take 1 tablet (200 mg total) by mouth 3 (three) times daily.  10/14/15   Nona Dell, PA-C  traMADol (ULTRAM) 50 MG tablet Take 1 tablet (50 mg total) by mouth every 6 (six) hours as needed (Do not take with percocet). Patient not taking: Reported on 08/09/2015 07/14/15   Dorothyann Gibbs, NP    Family History Family History  Problem Relation Age of Onset  . Hypertension Mother   . Hypertension Maternal Grandmother   . Stroke Maternal Grandmother   . Heart disease Paternal Grandfather   . Other Father     dec unknown cause age 52  . Hypertension Father       Social History Social History  Substance Use Topics  . Smoking status: Former Smoker    Types: Cigarettes    Quit date: 07/04/1984  . Smokeless tobacco: Former Systems developer    Quit date: 02/21/1984  . Alcohol use 0.0 oz/week     Comment: one drink/month     Allergies   Kiwi extract   Review of Systems Review of Systems  Constitutional: Negative for chills and fever.  Cardiovascular: Negative for chest pain.  Gastrointestinal: Negative for nausea and vomiting.  Genitourinary: Positive for dysuria, frequency and urgency. Negative for hematuria, vaginal bleeding and vaginal discharge.  Musculoskeletal: Negative for myalgias.  All other systems reviewed and are negative.    Physical Exam Updated Vital Signs BP 154/80 (BP Location: Right Arm)   Pulse 64   Temp 97.9 F (36.6 C) (Oral)   Resp 18   Ht 5\' 4"  (1.626 m)   Wt 93 kg   LMP 02/21/2011 (Approximate)   SpO2 98%   BMI 35.19 kg/m   Physical Exam  Constitutional: She is oriented to person, place, and time. She appears well-developed and well-nourished. No distress.  HENT:  Head: Normocephalic and atraumatic.  Mouth/Throat: Oropharynx is clear and moist. No oropharyngeal exudate.  Eyes: Conjunctivae and EOM are normal. Right eye exhibits no discharge. Left eye exhibits no discharge. No scleral icterus.  Neck: Normal range of motion. Neck supple.  Cardiovascular: Normal rate, regular rhythm, normal heart sounds and intact distal pulses.   No murmur heard. Pulmonary/Chest: Effort normal and breath sounds normal. No respiratory distress. She has no wheezes. She has no rales. She exhibits no tenderness.  Abdominal: Soft. Bowel sounds are normal. She exhibits no distension and no mass. There is no tenderness. There is no rebound and no guarding. No hernia.  No CVA tenderness  Musculoskeletal: She exhibits no edema.  Neurological: She is alert and oriented to person, place, and time.  Skin: Skin is warm and dry.   Psychiatric: She has a normal mood and affect.  Nursing note and vitals reviewed.    ED Treatments / Results  DIAGNOSTIC STUDIES:  Oxygen Saturation is 100% on RA, normal by my interpretation.    COORDINATION OF CARE:  5:54 PM Will analyze culture from past UTI. Discussed treatment plan with pt at bedside and pt agreed to plan.  Labs (all labs ordered are listed, but only abnormal results are displayed) Labs Reviewed  URINALYSIS, ROUTINE W REFLEX MICROSCOPIC (NOT AT [facility]) - Abnormal; Notable for the following:       Result Value   APPearance TURBID (*)    Hgb urine dipstick MODERATE (*)    Protein, ur 30 (*)    Nitrite POSITIVE (*)    Leukocytes, UA LARGE (*)    All other components within normal limits  URINE MICROSCOPIC-ADD ON - Abnormal; Notable for the following:    Squamous Epithelial / LPF  0-5 (*)    Bacteria, UA MANY (*)    All other components within normal limits  URINE CULTURE    EKG  EKG Interpretation None       Radiology No results found.  Procedures Procedures (including critical care time)  Medications Ordered in ED Medications  phenazopyridine (PYRIDIUM) tablet 200 mg (200 mg Oral Given 10/14/15 1817)     Initial Impression / Assessment and Plan / ED Course  I have reviewed the triage vital signs and the nursing notes.  Pertinent labs & imaging results that were available during my care of the patient were reviewed by me and considered in my medical decision making (see chart for details).  Clinical Course    Patient presents with dysuria and lower abdominal discomfort with urinating that started this morning. Patient reports history of recurrent UTIs and notes she takes Macrobid once daily which she was started on a few months ago by her PCP. Denies fever, heamturia or flank pain. VSS. Exam unremarkable, no abdominal tenderness, no CVA tenderness. Remaining exam unremarkable. UA showed moderate hemoglobin, positive nitrites and large  leukocytes with too numerous to count WBCs and many bacteria. Due to patient with history of recurrent UTIs, will send urine culture. Plan to discharge patient home with prescription of Keflex. On reevaluation patient denies any complaints at this time but notes that she is almost out of her prescription of Macrobid. Will give patient a prescription to refill her Macrobid and advise her to take both antibiotics. Advised patient to follow up with her PCP within the next week. Discussed strict return precautions with patient.  Patients blood pressure 184/91 on initial evaluation. Patient denies history of hypertension. Patient denies headache, visual changes, lightheadedness, dizziness, shortness of breath, chest pain, abdominal pain, numbness, tingling, weakness. No signs of hypertensive emergency or urgency at this time. Repeat BP 154/80. Advised patient to follow up with PCP to have her BP rechecked in 1 week.   Final Clinical Impressions(s) / ED Diagnoses   Final diagnoses:  UTI (lower urinary tract infection)    New Prescriptions Discharge Medication List as of 10/14/2015  6:24 PM    START taking these medications   Details  phenazopyridine (PYRIDIUM) 200 MG tablet Take 1 tablet (200 mg total) by mouth 3 (three) times daily., Starting Thu 10/14/2015, Print       I personally performed the services described in this documentation, which was scribed in my presence. The recorded information has been reviewed and is accurate.    Chesley Noon [place], [place] 10/14/15 2318    Isla Pence, MD 10/19/15 [phone]

## 2015-10-17 LAB — URINE CULTURE: Culture: >=100000 — AB

## 2015-10-18 ENCOUNTER — Telehealth (HOSPITAL_BASED_OUTPATIENT_CLINIC_OR_DEPARTMENT_OTHER): Payer: Self-pay | Admitting: Emergency Medicine

## 2015-10-18 NOTE — Telephone Encounter (Signed)
Post ED Visit - Positive Culture Follow-up  Culture report reviewed by antimicrobial stewardship pharmacist:  []  Elenor Quinones, Pharm.D. []  Heide Guile, Pharm.D., BCPS [x]  Parks Neptune, Pharm.D. []  Alycia Rossetti, Pharm.D., BCPS []  [place], [place].D., BCPS, AAHIVP []  Legrand Como, Pharm.D., BCPS, AAHIVP []  Milus Glazier, Pharm.D. []  [place]s November, [place].D.  Positive urine culture Treated with cephalexin and nitrofurantoin, organism sensitive to the same and no further patient follow-up is required at this time.  Hazle Nordmann 10/18/2015, 8:46 AM

## 2015-11-26 ENCOUNTER — Encounter: Payer: Self-pay | Admitting: Family Medicine

## 2015-11-26 ENCOUNTER — Ambulatory Visit (INDEPENDENT_AMBULATORY_CARE_PROVIDER_SITE_OTHER): Payer: Managed Care, Other (non HMO) | Admitting: Family Medicine

## 2015-11-26 VITALS — BP 178/102 | HR 90 | Temp 98.5°F | Resp 18 | Ht 63.0 in | Wt 204.8 lb

## 2015-11-26 DIAGNOSIS — N390 Urinary tract infection, site not specified: Secondary | ICD-10-CM | POA: Diagnosis not present

## 2015-11-26 DIAGNOSIS — R030 Elevated blood-pressure reading, without diagnosis of hypertension: Secondary | ICD-10-CM | POA: Diagnosis not present

## 2015-11-26 DIAGNOSIS — K529 Noninfective gastroenteritis and colitis, unspecified: Secondary | ICD-10-CM | POA: Diagnosis not present

## 2015-11-26 LAB — CBC WITH DIFFERENTIAL/PLATELET
BASOS ABS: 74 {cells}/uL (ref 0–200)
Basophils Relative: 1 %
EOS ABS: 148 {cells}/uL (ref 15–500)
Eosinophils Relative: 2 %
HCT: 43.8 % (ref 35.0–45.0)
Hemoglobin: 15.2 g/dL (ref 11.7–15.5)
Lymphocytes Relative: 31 %
Lymphs Abs: 2294 cells/uL (ref 850–3900)
MCH: 29.2 pg (ref 27.0–33.0)
MCHC: 34.7 g/dL (ref 32.0–36.0)
MCV: 84.1 fL (ref 80.0–100.0)
MONOS PCT: 8 %
MPV: 9.9 fL (ref 7.5–12.5)
Monocytes Absolute: 592 cells/uL (ref 200–950)
NEUTROS ABS: 4292 {cells}/uL (ref 1500–7800)
NEUTROS PCT: 58 %
PLATELETS: 254 10*3/uL (ref 140–400)
RBC: 5.21 MIL/uL — ABNORMAL HIGH (ref 3.80–5.10)
RDW: 14 % (ref 11.0–15.0)
WBC: 7.4 10*3/uL (ref 3.8–10.8)

## 2015-11-26 LAB — BASIC METABOLIC PANEL WITH GFR
BUN: 10 mg/dL (ref 7–25)
CALCIUM: 9.6 mg/dL (ref 8.6–10.4)
CO2: 27 mmol/L (ref 20–31)
CREATININE: 0.72 mg/dL (ref 0.50–1.05)
Chloride: 103 mmol/L (ref 98–110)
GFR, Est African American: >89 mL/min (ref >=60)
GFR, Est Non African American: >89 mL/min (ref >=60)
GLUCOSE: 89 mg/dL (ref 65–99)
Potassium: 4 mmol/L (ref 3.5–5.3)
Sodium: 139 mmol/L (ref 135–146)

## 2015-11-26 LAB — POCT URINALYSIS DIP (MANUAL ENTRY)
BILIRUBIN UA: NEGATIVE
BILIRUBIN UA: NEGATIVE
GLUCOSE UA: NEGATIVE
Nitrite, UA: NEGATIVE
Protein Ur, POC: NEGATIVE
SPEC GRAV UA: 1.02
Urobilinogen, UA: 0.2
pH, UA: 5.5

## 2015-11-26 LAB — POC MICROSCOPIC URINALYSIS (UMFC): MUCUS RE: ABSENT

## 2015-11-26 MED ORDER — NITROFURANTOIN MONOHYD MACRO 100 MG PO CAPS: 100.0000 mg | ORAL_CAPSULE | Freq: Every day | ORAL | 1 refills | Status: DC

## 2015-11-26 MED ORDER — PHENAZOPYRIDINE HCL 200 MG PO TABS: 200.0000 mg | ORAL_TABLET | Freq: Three times a day (TID) | ORAL | 0 refills | Status: DC | PRN

## 2015-11-26 MED ORDER — SULFAMETHOXAZOLE-TRIMETHOPRIM 800-160 MG PO TABS: 1.0000 | ORAL_TABLET | Freq: Two times a day (BID) | ORAL | 0 refills | Status: DC

## 2015-11-26 MED ORDER — HYDROCHLOROTHIAZIDE 25 MG PO TABS: 25.0000 mg | ORAL_TABLET | Freq: Every day | ORAL | 1 refills | Status: DC

## 2015-11-26 NOTE — Patient Instructions (Addendum)
Increase potassium in your diet to avoid muscle aches from the hctz blood pressure medicine.  Try to limit salt in your diet - salt substitutes are a great way to do that as they are often made of potassium.  Drink plenty of water. Recheck in 1-2 months to recheck blood pressure. Recommend getting a home BP cuff to get some readings outside of the office - omrion is a good brand. Try an elimination diet based on the FODMAP foods to see if your bowels improve (low FODMAP diet is good for bowels, high FODMAP often cause gas and diarrhea in some people.)  IF you received an x-ray today, you will receive an invoice from [facility]. Please contact [facility] at [phone] with questions or concerns regarding your invoice.   IF you received labwork today, you will receive an invoice from [employer]. Please contact [facility] at [phone] with questions or concerns regarding your invoice.   Our billing staff will not be able to assist you with questions regarding bills from these companies.  You will be contacted with the lab results as soon as they are available. The fastest way to get your results is to activate your My Chart account. Instructions are located on the last page of this paperwork. If you have not heard from [place] regarding the results in 2 weeks, please contact this office.     Potassium Content of Foods Potassium is a mineral found in many foods and drinks. It helps keep fluids and minerals balanced in your body and affects how steadily your heart beats. Potassium also helps control your blood pressure and keep your muscles and nervous system healthy. Certain health conditions and medicines may change the balance of potassium in your body. When this happens, you can help balance your level of potassium through the foods that you do or do not eat. Your health care provider or dietitian may recommend an amount of potassium that you should  have each day. The following lists of foods provide the amount of potassium (in parentheses) per serving in each item. HIGH IN POTASSIUM  The following foods and beverages have 200 mg or more of potassium per serving:  Apricots, 2 raw or 5 dry (200 mg).  Artichoke, 1 medium (345 mg).  Avocado, raw,  each (245 mg).  Banana, 1 medium (425 mg).  Beans, lima, or baked beans, canned,  cup (280 mg).  Beans, white, canned,  cup (595 mg).  Beef roast, 3 oz (320 mg).  Beef, ground, 3 oz (270 mg).  Beets, raw or cooked,  cup (260 mg).  Bran muffin, 2 oz (300 mg).  Broccoli,  cup (230 mg).  Brussels sprouts,  cup (250 mg).  Cantaloupe,  cup (215 mg).  Cereal, 100% bran,  cup (200-400 mg).  Cheeseburger, single, fast food, 1 each (225-400 mg).  Chicken, 3 oz (220 mg).  Clams, canned, 3 oz (535 mg).  Crab, 3 oz (225 mg).  Dates, 5 each (270 mg).  Dried beans and peas,  cup (300-475 mg).  Figs, dried, 2 each (260 mg).  Fish: halibut, tuna, cod, snapper, 3 oz (480 mg).  Fish: salmon, haddock, swordfish, perch, 3 oz (300 mg).  Fish, tuna, canned 3 oz (200 mg).  [place] fries, fast food, 3 oz (470 mg).  Granola with fruit and nuts,  cup (200 mg).  Grapefruit juice,  cup (200 mg).  Greens, beet,  cup (655 mg).  Honeydew melon,  cup (200 mg).  Irene Limbo,  raw, 1 cup (300 mg).  Kiwi, 1 medium (240 mg).  Kohlrabi, rutabaga, parsnips,  cup (280 mg).  Lentils,  cup (365 mg).  Mango, 1 each (325 mg).  Milk, chocolate, 1 cup (420 mg).  Milk: nonfat, low-fat, whole, buttermilk, 1 cup (350-380 mg).  Molasses, 1 Tbsp (295 mg).  Mushrooms,  cup (280) mg.  Nectarine, 1 each (275 mg).  Nuts: almonds, peanuts, hazelnuts, [place], cashew, mixed, 1 oz (200 mg).  Nuts, pistachios, 1 oz (295 mg).  Orange, 1 each (240 mg).  Orange juice,  cup (235 mg).  Papaya, medium,  fruit (390 mg).  Peanut butter, chunky, 2 Tbsp (240 mg).  Peanut butter, smooth,  2 Tbsp (210 mg).  Pear, 1 medium (200 mg).  Pomegranate, 1 whole (400 mg).  Pomegranate juice,  cup (215 mg).  Pork, 3 oz (350 mg).  Potato chips, salted, 1 oz (465 mg).  Potato, baked with skin, 1 medium (925 mg).  Potatoes, boiled,  cup (255 mg).  Potatoes, mashed,  cup (330 mg).  Prune juice,  cup (370 mg).  Prunes, 5 each (305 mg).  Pudding, chocolate,  cup (230 mg).  Pumpkin, canned,  cup (250 mg).  Raisins, seedless,  cup (270 mg).  Seeds, sunflower or pumpkin, 1 oz (240 mg).  Soy milk, 1 cup (300 mg).  Spinach,  cup (420 mg).  Spinach, canned,  cup (370 mg).  Sweet potato, baked with skin, 1 medium (450 mg).  Swiss chard,  cup (480 mg).  Tomato or vegetable juice,  cup (275 mg).  Tomato sauce or puree,  cup (400-550 mg).  Tomato, raw, 1 medium (290 mg).  Tomatoes, canned,  cup (200-300 mg).  [place], 3 oz (250 mg).  Wheat germ, 1 oz (250 mg).  Winter squash,  cup (250 mg).  Yogurt, plain or fruited, 6 oz (260-435 mg).  Zucchini,  cup (220 mg). MODERATE IN POTASSIUM The following foods and beverages have 50-200 mg of potassium per serving:  Apple, 1 each (150 mg).  Apple juice,  cup (150 mg).  Applesauce,  cup (90 mg).  Apricot nectar,  cup (140 mg).  Asparagus, small spears,  cup or 6 spears (155 mg).  Bagel, cinnamon raisin, 1 each (130 mg).  Bagel, egg or plain, 4 in., 1 each (70 mg).  Beans, green,  cup (90 mg).  Beans, yellow,  cup (190 mg).  Beer, regular, 12 oz (100 mg).  Beets, canned,  cup (125 mg).  Blackberries,  cup (115 mg).  Blueberries,  cup (60 mg).  Bread, whole wheat, 1 slice (70 mg).  Broccoli, raw,  cup (145 mg).  Cabbage,  cup (150 mg).  Carrots, cooked or raw,  cup (180 mg).  Cauliflower, raw,  cup (150 mg).  Celery, raw,  cup (155 mg).  Cereal, bran flakes, cup (120-150 mg).  Cheese, cottage,  cup (110 mg).  Cherries, 10 each (150 mg).  Chocolate, 1 oz  bar (165 mg).  Coffee, brewed 6 oz (90 mg).  Corn,  cup or 1 ear (195 mg).  Cucumbers,  cup (80 mg).  Egg, large, 1 each (60 mg).  Eggplant,  cup (60 mg).  Endive, raw, cup (80 mg).  English muffin, 1 each (65 mg).  Fish, orange roughy, 3 oz (150 mg).  Frankfurter, beef or pork, 1 each (75 mg).  Fruit cocktail,  cup (115 mg).  Grape juice,  cup (170 mg).  Grapefruit,  fruit (175 mg).  Grapes,  cup (155 mg).  Greens: kale, turnip, collard,  cup (110-150 mg).  Ice cream or frozen yogurt, chocolate,  cup (175 mg).  Ice cream or frozen yogurt, vanilla,  cup (120-150 mg).  Lemons, limes, 1 each (80 mg).  Lettuce, all types, 1 cup (100 mg).  Mixed vegetables,  cup (150 mg).  Mushrooms, raw,  cup (110 mg).  Nuts: walnuts, pecans, or macadamia, 1 oz (125 mg).  Oatmeal,  cup (80 mg).  Okra,  cup (110 mg).  Onions, raw,  cup (120 mg).  Peach, 1 each (185 mg).  Peaches, canned,  cup (120 mg).  Pears, canned,  cup (120 mg).  Peas, green, frozen,  cup (90 mg).  Peppers, green,  cup (130 mg).  Peppers, red,  cup (160 mg).  Pineapple juice,  cup (165 mg).  Pineapple, fresh or canned,  cup (100 mg).  Plums, 1 each (105 mg).  Pudding, vanilla,  cup (150 mg).  Raspberries,  cup (90 mg).  Rhubarb,  cup (115 mg).  Rice, wild,  cup (80 mg).  Shrimp, 3 oz (155 mg).  Spinach, raw, 1 cup (170 mg).  Strawberries,  cup (125 mg).  Summer squash  cup (175-200 mg).  Swiss chard, raw, 1 cup (135 mg).  Tangerines, 1 each (140 mg).  Tea, brewed, 6 oz (65 mg).  Turnips,  cup (140 mg).  Watermelon,  cup (85 mg).  Wine, red, table, 5 oz (180 mg).  Wine, white, table, 5 oz (100 mg). LOW IN POTASSIUM The following foods and beverages have less than 50 mg of potassium per serving.  Bread, white, 1 slice (30 mg).  Carbonated beverages, 12 oz (less than 5 mg).  Cheese, 1 oz (20-30 mg).  Cranberries,  cup (45  mg).  Cranberry juice cocktail,  cup (20 mg).  Fats and oils, 1 Tbsp (less than 5 mg).  Hummus, 1 Tbsp (32 mg).  Nectar: papaya, mango, or pear,  cup (35 mg).  Rice, white or brown,  cup (50 mg).  Spaghetti or macaroni,  cup cooked (30 mg).  Tortilla, flour or corn, 1 each (50 mg).  Waffle, 4 in., 1 each (50 mg).  Water chestnuts,  cup (40 mg).   This information is not intended to replace advice given to you by your health care provider. Make sure you discuss any questions you have with your health care provider.   Document Released: 09/20/2004 Document Revised: 02/11/2013 Document Reviewed: 01/03/2013 Elsevier Interactive Patient Education 2016 [place] Your High Blood Pressure Blood pressure is a measurement of how forceful your blood is pressing against the walls of the arteries. Arteries are muscular tubes within the circulatory system. Blood pressure does not stay the same. Blood pressure rises when you are active, excited, or nervous; and it lowers during sleep and relaxation. If the numbers measuring your blood pressure stay above normal most of the time, you are at risk for health problems. High blood pressure (hypertension) is a long-term (chronic) condition in which blood pressure is elevated. A blood pressure reading is recorded as two numbers, such as 120 over 80 (or 120/80). The first, higher number is called the systolic pressure. It is a measure of the pressure in your arteries as the heart beats. The second, lower number is called the diastolic pressure. It is a measure of the pressure in your arteries as the heart relaxes between beats.  Keeping your blood pressure in a normal range is important to your overall health and prevention of health problems, such  as heart disease and stroke. When your blood pressure is uncontrolled, your heart has to work harder than normal. High blood pressure is a very common condition in adults because blood pressure  tends to rise with age. Men and women are equally likely to have hypertension but at different times in life. Before age 62, men are more likely to have hypertension. After 60 years of age, women are more likely to have it. Hypertension is especially common in African Americans. This condition often has no signs or symptoms. The cause of the condition is usually not known. Your caregiver can help you come up with a plan to keep your blood pressure in a normal, healthy range. BLOOD PRESSURE STAGES Blood pressure is classified into four stages: normal, prehypertension, stage 1, and stage 2. Your blood pressure reading will be used to determine what type of treatment, if any, is necessary. Appropriate treatment options are tied to these four stages:  Normal  Systolic pressure (mm Hg): below 120.  Diastolic pressure (mm Hg): below 80. Prehypertension  Systolic pressure (mm Hg): 120 to 139.  Diastolic pressure (mm Hg): 80 to 89. Stage1  Systolic pressure (mm Hg): 140 to 159.  Diastolic pressure (mm Hg): 90 to 99. Stage2  Systolic pressure (mm Hg): 160 or above.  Diastolic pressure (mm Hg): 100 or above. RISKS RELATED TO HIGH BLOOD PRESSURE Managing your blood pressure is an important responsibility. Uncontrolled high blood pressure can lead to:  A heart attack.  A stroke.  A weakened blood vessel (aneurysm).  Heart failure.  Kidney damage.  Eye damage.  Metabolic syndrome.  Memory and concentration problems. HOW TO MANAGE YOUR BLOOD PRESSURE Blood pressure can be managed effectively with lifestyle changes and medicines (if needed). Your caregiver will help you come up with a plan to bring your blood pressure within a normal range. Your plan should include the following: Education  Read all information provided by your caregivers about how to control blood pressure.  Educate yourself on the latest guidelines and treatment recommendations. New research is always being done  to further define the risks and treatments for high blood pressure. Lifestylechanges  Control your weight.  Avoid smoking.  Stay physically active.  Reduce the amount of salt in your diet.  Reduce stress.  Control any chronic conditions, such as high cholesterol or diabetes.  Reduce your alcohol intake. Medicines  Several medicines (antihypertensive medicines) are available, if needed, to bring blood pressure within a normal range. Communication  Review all the medicines you take with your caregiver because there may be side effects or interactions.  Talk with your caregiver about your diet, exercise habits, and other lifestyle factors that may be contributing to high blood pressure.  See your caregiver regularly. Your caregiver can help you create and adjust your plan for managing high blood pressure. RECOMMENDATIONS FOR TREATMENT AND FOLLOW-UP  The following recommendations are based on current guidelines for managing high blood pressure in nonpregnant adults. Use these recommendations to identify the proper follow-up period or treatment option based on your blood pressure reading. You can discuss these options with your caregiver.  Systolic pressure of 123456 to [id number] or diastolic pressure of 80 to 89: Follow up with your caregiver as directed.  Systolic pressure of [id number] to 0000000 or diastolic pressure of 90 to 100: Follow up with your caregiver within 2 months.  Systolic pressure above 0000000 or diastolic pressure above [id number]: Follow up with your caregiver within 1 month.  Systolic pressure above [id number]  or diastolic pressure above [id number]: Consider antihypertensive therapy; follow up with your caregiver within 1 week.  Systolic pressure above [id number] or diastolic pressure above 123456: Begin antihypertensive therapy; follow up with your caregiver within 1 week.   This information is not intended to replace advice given to you by your health care provider. Make sure you discuss any questions you have  with your health care provider.   Document Released: 11/01/2011 Document Reviewed: 11/01/2011 Elsevier Interactive Patient Education 2016 Else[employer] Inc. Irritable Bowel Syndrome, Adult Irritable bowel syndrome (IBS) is not one specific disease. It is a group of symptoms that affects the organs responsible for digestion (gastrointestinal or GI tract).  To regulate how your GI tract works, your body sends signals back and forth between your intestines and your brain. If you have IBS, there may be a problem with these signals. As a result, your GI tract does not function normally. Your intestines may become more sensitive and overreact to certain things. This is especially true when you eat certain foods or when you are under stress.  There are four types of IBS. These may be determined based on the consistency of your stool:   IBS with diarrhea.   IBS with constipation.   Mixed IBS.   Unsubtyped IBS.  It is important to know which type of IBS you have. Some treatments are more likely to be helpful for certain types of IBS.  CAUSES  The exact cause of IBS is not known. RISK FACTORS You may have a higher risk of IBS if:  You are a woman.  You are younger than 60 years old.  You have a family history of IBS.  You have mental health problems.  You have had bacterial infection of your GI tract. SIGNS AND SYMPTOMS  Symptoms of IBS vary from person to person. The main symptom is abdominal pain or discomfort. Additional symptoms usually include one or more of the following:   Diarrhea, constipation, or both.   Abdominal swelling or bloating.   Feeling full or sick after eating a small or regular-size meal.   Frequent gas.   Mucus in the stool.   A feeling of having more stool left after a bowel movement.  Symptoms tend to come and go. They may be associated with stress, psychiatric conditions, or nothing at all.  DIAGNOSIS  There is no specific test to diagnose IBS.  Your health care provider will make a diagnosis based on a physical exam, medical history, and your symptoms. You may have other tests to rule out other conditions that may be causing your symptoms. These may include:   Blood tests.   X-rays.   CT scan.  Endoscopy and colonoscopy. This is a test in which your GI tract is viewed with a long, thin, flexible tube. TREATMENT There is no cure for IBS, but treatment can help relieve symptoms. IBS treatment often includes:   Changes to your diet, such as:  Eating more fiber.  Avoiding foods that cause symptoms.  Drinking more water.  Eating regular, medium-sized portioned meals.  Medicines. These may include:  Fiber supplements if you have constipation.  Medicine to control diarrhea (antidiarrheal medicines).  Medicine to help control muscle spasms in your GI tract (antispasmodic medicines).  Medicines to help with any mental health issues, such as antidepressants or tranquilizers.  Therapy.  Talk therapy may help with anxiety, depression, or other mental health issues that can make IBS symptoms worse.  Stress reduction.  Managing your stress  can help keep symptoms under control. HOME CARE INSTRUCTIONS   Take medicines only as directed by your health care provider.  Eat a healthy diet.  Avoid foods and drinks with added sugar.  Include more whole grains, fruits, and vegetables gradually into your diet. This may be especially helpful if you have IBS with constipation.  Avoid any foods and drinks that make your symptoms worse. These may include dairy products and caffeinated or carbonated drinks.  Do not eat large meals.  Drink enough fluid to keep your urine clear or pale yellow.  Exercise regularly. Ask your health care provider for recommendations of good activities for you.  Keep all follow-up visits as directed by your health care provider. This is important. SEEK MEDICAL CARE IF:   You have constant  pain.  You have trouble or pain with swallowing.  You have worsening diarrhea. SEEK IMMEDIATE MEDICAL CARE IF:   You have severe and worsening abdominal pain.   You have diarrhea and:   You have a rash, stiff neck, or severe headache.   You are irritable, sleepy, or difficult to awaken.   You are weak, dizzy, or extremely thirsty.   You have bright red blood in your stool or you have black tarry stools.   You have unusual abdominal swelling that is painful.   You vomit continuously.   You vomit blood (hematemesis).   You have both abdominal pain and a fever.    This information is not intended to replace advice given to you by your health care provider. Make sure you discuss any questions you have with your health care provider.   Document Released: 02/06/2005 Document Revised: 02/27/2014 Document Reviewed: 10/24/2013 Elsevier Interactive Patient Education 2016 [place]. Diet for Irritable Bowel Syndrome When you have irritable bowel syndrome (IBS), the foods you eat and your eating habits are very important. IBS may cause various symptoms, such as abdominal pain, constipation, or diarrhea. Choosing the right foods can help ease discomfort caused by these symptoms. Work with your health care provider and dietitian to find the best eating plan to help control your symptoms. WHAT GENERAL GUIDELINES DO I NEED TO FOLLOW?  Keep a food diary. This will help you identify foods that cause symptoms. Write down:  What you eat and when.  What symptoms you have.  When symptoms occur in relation to your meals.  Avoid foods that cause symptoms. Talk with your dietitian about other ways to get the same nutrients that are in these foods.  Eat more foods that contain fiber. Take a fiber supplement if directed by your dietitian.  Eat your meals slowly, in a relaxed setting.  Aim to eat 5-6 small meals per day. Do not skip meals.  Drink enough fluids to keep your  urine clear or pale yellow.  Ask your health care provider if you should take an over-the-counter probiotic during flare-ups to help restore healthy gut bacteria.  If you have cramping or diarrhea, try making your meals low in fat and high in carbohydrates. Examples of carbohydrates are pasta, rice, whole grain breads and cereals, fruits, and vegetables.  If dairy products cause your symptoms to flare up, try eating less of them. You might be able to handle yogurt better than other dairy products because it contains bacteria that help with digestion. WHAT FOODS ARE NOT RECOMMENDED? The following are some foods and drinks that may worsen your symptoms:  Fatty foods, such as [place] fries.  Milk products, such as cheese or  ice cream.  Chocolate.  Alcohol.  Products with caffeine, such as coffee.  Carbonated drinks, such as soda. The items listed above may not be a complete list of foods and beverages to avoid. Contact your dietitian for more information. WHAT FOODS ARE GOOD SOURCES OF FIBER? Your health care provider or dietitian may recommend that you eat more foods that contain fiber. Fiber can help reduce constipation and other IBS symptoms. Add foods with fiber to your diet a little at a time so that your body can get used to them. Too much fiber at once might cause gas and swelling of your abdomen. The following are some foods that are good sources of fiber:  Apples.  Peaches.  Pears.  Berries.  Figs.  Broccoli (raw).  Cabbage.  Carrots.  Raw peas.  Kidney beans.  [place]ima beans.  Whole grain bread.  Whole grain cereal. FOR MORE INFORMATION  International Foundation for Functional Gastrointestinal Disorders: www.iffgd.[employer] of Diabetes and Digestive and Kidney Diseases: NetworkAffair.co.za.aspx   This information is not intended to replace advice given to you by your health care  provider. Make sure you discuss any questions you have with your health care provider.   Document Released: 04/29/2003 Document Revised: 02/27/2014 Document Reviewed: 05/09/2013 Elsevier Interactive Patient Education [employer].

## 2015-11-26 NOTE — Progress Notes (Signed)
Subjective:  By signing my name below, I, Kimberly Griffith, attest that this documentation has been prepared under the direction and in the presence of Delman Cheadle, MD.  Electronically Signed: Thea Alken, ED Scribe. 11/26/2015. 2:55 PM.   Patient ID: Kimberly Griffith, female    DOB: 1955-07-30, 60 y.o.   MRN: [id number]  HPI   Chief Complaint  Patient presents with  . Urinary Frequency    urgency   HPI Comments: Kimberly Griffith is a 60 y.o. female who presents to the [facility] complaining of urinary frequency. Pt has had recurrent UTI's. I last saw her for this 6 months prior. Since that time time her Gynecologist, Dr. Quincy Simmonds, diagnosed her with endometrial carcinoma and underwent a total hysterectomy with BSO in May. Luckily it was stage 1A grade I. She was recommended to have pelvic exam every 6 months every for 5 years and than yearly. She was advised to return to gynecology if experiencing any  leg swelling, vaginal bleeding, discharge or change in bladder and bowel. She had vaginal cuff atrophy. Started her on vaginal estrogen 3 times a week. Since that time pt has had 2 UTI's treated in ED initially with macrobid, followed by Keflex for persistent symptoms each time. She takes once daily Macrobid for prevention and is on probiotic. Pt was noted to have elevated BP during ED visits. Last renal function in May was normal thought isolated calcium of 8.5.   Pt states she forgot to take Macrobid last night and woke up this morning with symptoms of pelvic pressure, urgency, frequency. She's tried pyridium and cranberry supplements which seems to help. She finds the Keflex works well for her.  She denies fever, chills, nausea, emesis. She reports intolerance to Augmentin which caused severe diarrhea.   After reviewing chart, onset of elevated BP started after hysterectomy. Pt has noticed leg swelling with some pain. Pt denies changes in HA, vision changes, flasher, floaters. Pt has family hx  of HTN in both parents.   Pt reports some enuresis since surgery. She reports bowel incontinence for several years. States she was tested for Chron's and IBS in the past, both were negative  Reports hx diarrhea, worse with stress. She had been treated with probiotics in the past. She is lactose intolerant.   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   Patient Active Problem List   Diagnosis Date Noted  . Endometrial cancer ([place]) 07/05/2015   Past Medical History:  Diagnosis Date  . Allergy    kiwi  . Blood transfusion without reported diagnosis 1993   Dx'd with E.Coli  . Cancer ([place]) 2017   endometrial  . Chronic kidney disease   . Headache   . History of kidney stones 2008  . Pneumonia 2015  . Tremor    left thumb   Past Surgical History:  Procedure Laterality Date  . LYMPH NODE BIOPSY Bilateral 07/13/2015   Procedure: SENTINEL LYMPH NODE BIOPSY;  Surgeon: Janie Morning, MD;  Location: [facility];  Service: Gynecology;  Laterality: Bilateral;  . ROBOTIC ASSISTED TOTAL HYSTERECTOMY WITH BILATERAL SALPINGO OOPHERECTOMY Bilateral 07/13/2015   Procedure: XI ROBOTIC ASSISTED LAPAROSCOPIC TOTAL HYSTERECTOMY WITH BILATERAL SALPINGO OOPHORECTOMY;  Surgeon: Janie Morning, MD;  Location: [facility];  Service: Gynecology;  Laterality: Bilateral;  . TONSILLECTOMY AND ADENOIDECTOMY    . WISDOM TOOTH EXTRACTION     Allergies  Allergen Reactions  . Kiwi Extract Swelling and Other (See  Comments)    Tongue really swollen   Prior to Admission medications   Medication Sig Start Date End Date Taking? Authorizing Provider  conjugated estrogens (PREMARIN) vaginal cream Place 1 Applicatorful vaginally 3 (three) times a week. Three times a week for one month 08/10/15   Dorothyann Gibbs, NP  Lactase (LACTAID PO) Take 1 tablet by mouth daily as needed (For lactose intolerance.).    Historical Provider, MD  nitrofurantoin, macrocrystal-monohydrate, (MACROBID) 100 MG  capsule Take 1 capsule (100 mg total) by mouth daily. 10/14/15   Nona Dell, PA-C  phenazopyridine (PYRIDIUM) 200 MG tablet Take 1 tablet (200 mg total) by mouth 3 (three) times daily. 10/14/15   Nona Dell, PA-C  Probiotic Product (PROBIOTIC DAILY PO) Take 1 tablet by mouth daily as needed (For digestive support.). Reported on 08/09/2015    Historical Provider, MD  traMADol (ULTRAM) 50 MG tablet Take 1 tablet (50 mg total) by mouth every 6 (six) hours as needed (Do not take with percocet). Patient not taking: Reported on 08/09/2015 07/14/15   Dorothyann Gibbs, NP   Social History   Social History  . Marital status: Married    Spouse name: N/A  . Number of children: N/A  . Years of education: N/A   Occupational History  . Not on file.   Social History Main Topics  . Smoking status: Former Smoker    Types: Cigarettes    Quit date: 07/04/1984  . Smokeless tobacco: Former Systems developer    Quit date: 02/21/1984  . Alcohol use 0.0 oz/week     Comment: one drink/month  . Drug use: No  . Sexual activity: Yes    Partners: Male    Birth control/ protection: Post-menopausal   Other Topics Concern  . Not on file   Social History Narrative  . No narrative on file   Review of Systems  Constitutional: Negative for chills and fever.  Eyes: Negative for visual disturbance.  Cardiovascular: Positive for leg swelling.  Gastrointestinal: Positive for diarrhea. Negative for nausea and vomiting.  Genitourinary: Positive for enuresis, frequency, pelvic pain ( pressure) and urgency.  Neurological: Negative for headaches.    Objective:   Physical Exam  Constitutional: She is oriented to person, place, and time. She appears well-developed and well-nourished. No distress.  HENT:  Head: Normocephalic and atraumatic.  Eyes: Conjunctivae and EOM are normal.  Neck: Neck supple. No tracheal deviation present.  Cardiovascular: Normal rate, regular rhythm, S1 normal, S2 normal and normal  heart sounds.   No murmur heard. Pulmonary/Chest: Effort normal and breath sounds normal. She has no wheezes. She has no rales.  Abdominal: There is no CVA tenderness.  Musculoskeletal: Normal range of motion.  Lymphadenopathy:    She has no cervical adenopathy.  Neurological: She is alert and oriented to person, place, and time.  Skin: Skin is warm and dry.  Psychiatric: She has a normal mood and affect. Her behavior is normal.  Nursing note and vitals reviewed.   Vitals:   11/26/15 1453  BP: (!) 178/102  Pulse: 90  Resp: 18  Temp: 98.5 F (36.9 C)  TempSrc: Oral  SpO2: 96%  Weight: 204 lb 12.8 oz (92.9 kg)  Height: 5\' 3"  (1.6 m)   Results for orders placed or performed in visit on 11/26/15  POCT Microscopic Urinalysis ([facility])  Result Value Ref Range   WBC,UR,HPF,POC Too numerous to count  (A) None WBC/hpf   RBC,UR,HPF,POC Few (A) None RBC/hpf   Bacteria Many (A)  None, Too numerous to count   Mucus Absent Absent   Epithelial Cells, UR Per Microscopy None None, Too numerous to count cells/hpf  POCT urinalysis dipstick  Result Value Ref Range   Color, UA yellow yellow   Clarity, UA cloudy (A) clear   Glucose, UA negative negative   Bilirubin, UA negative negative   Ketones, POC UA negative negative   Spec Grav, UA 1.020    Blood, UA Griffith (A) negative   pH, UA 5.5    Protein Ur, POC negative negative   Urobilinogen, UA 0.2    Nitrite, UA Negative Negative   Leukocytes, UA Griffith (1+) (A) Negative    Assessment & Plan:   1. Recurrent UTI - sig reduction in UTI since starting daily prophylactic macrobid - cont. May be getting freq UTIs due to chronic diarrhea. Trx acute cystitis with bactrim.  2. Elevated blood pressure reading - new diagnosis HTN. Start hctz. Start checking BP outside of office so pt thinks she might have "white coat" HTN.  3. Chronic diarrhea - suspect IBS, pt is lactose intolerant but has not cut it out her diet.  Recommended trial of FODMAP diet  and GI eval - pt has not had any prior colonoscopy.     Orders Placed This Encounter  Procedures  . Urine culture  . BASIC METABOLIC PANEL WITH GFR  . CBC with Differential/Platelet  . Ambulatory referral to Gastroenterology    Referral Priority:   Routine    Referral Type:   Consultation    Referral Reason:   Specialty Services Required    Number of Visits Requested:   1  . POCT Microscopic Urinalysis ([facility])  . POCT urinalysis dipstick    Meds ordered this encounter  Medications  . sulfamethoxazole-trimethoprim (BACTRIM DS,SEPTRA DS) 800-160 MG tablet    Sig: Take 1 tablet by mouth 2 (two) times daily.    Dispense:  28 tablet    Refill:  0  . nitrofurantoin, macrocrystal-monohydrate, (MACROBID) 100 MG capsule    Sig: Take 1 capsule (100 mg total) by mouth daily.    Dispense:  90 capsule    Refill:  1  . DISCONTD: phenazopyridine (PYRIDIUM) 200 MG tablet    Sig: Take 1 tablet (200 mg total) by mouth 3 (three) times daily as needed for pain.    Dispense:  60 tablet    Refill:  0  . hydrochlorothiazide (HYDRODIURIL) 25 MG tablet    Sig: Take 1 tablet (25 mg total) by mouth daily.    Dispense:  90 tablet    Refill:  1  . phenazopyridine (PYRIDIUM) 200 MG tablet    Sig: Take 1 tablet (200 mg total) by mouth 3 (three) times daily as needed for pain.    Dispense:  60 tablet    Refill:  0    I personally performed the services described in this documentation, which was scribed in my presence. The recorded information has been reviewed and considered, and addended by me as needed.   Delman Cheadle, M.D.  [facility] [address] [place], [place] [address] [phone] phone [phone] fax  11/27/15 10:02 PM

## 2015-11-28 LAB — URINE CULTURE

## 2015-11-30 ENCOUNTER — Encounter: Payer: Self-pay | Admitting: Gastroenterology

## 2016-02-01 NOTE — Progress Notes (Deleted)
POSTOPERATIVE FOLLOW-UP : ENDOMETRIAL CANCER  Assessment:    60 y.o. year old with Stage IA Grade 1 endometrioid endometrial cancer.   S/p robotic hysterectomy, BSO, SLN biopsy on 07/13/15. no LVSI, 20% myometrial invasion, and negative lymph nodes. Low risk factors for recurrence. No adjuvant therapy recommended.   Plan: 1) Recommendation is for routine surveillance with frequent pelvic exams every 6 months x 5 years, at which time she can return to annual visits.  Discussed signs and symptoms of recurrence including vaginal bleeding or discharge, leg pain or swelling and changes in bowel or bladder habits.Marland Kitchen  3)  Return to clinic in 6 months to see Dr Skeet Latch or myself and in 12 months to see Dr Quincy Simmonds.  4) ***vaginal cuff atrophy and slow healing: vaginal premarin 3 times a week x 4 weeks.  HPI:  Kimberly Griffith is a 60 y.o. year old [id number] initially seen in consultation on 07/05/15 referred by Dr Quincy Simmonds for grade 1 endometrial cancer.  She then underwent a robotic hysterectomy, BSO, SLN biopsy with Dr Skeet Latch on [id number] without complications.  Her postoperative course was uncomplicated.  Her final pathologic diagnosis is a Stage IA Grade 1 endometrioid endometrial cancer with no lymphovascular space invasion, 3/13 mm (20%) of myometrial invasion and negative lymph nodes.  Interval Hx: ***  Review of systems: Constitutional:  She has no weight gain or weight loss. She has no fever or chills. Eyes: No blurred vision Ears, Nose, Mouth, Throat: No dizziness, headaches or changes in hearing. No mouth sores. Cardiovascular: No chest pain, palpitations or edema. Respiratory:  No shortness of breath, wheezing or cough Gastrointestinal: She has normal bowel movements without diarrhea or constipation. She denies any nausea or vomiting. She denies blood in her stool or heart burn. Genitourinary:  She denies pelvic pain, pelvic pressure or changes in her urinary function. She has no hematuria, dysuria,  or incontinence. She has no irregular vaginal bleeding or vaginal discharge Musculoskeletal: Denies muscle weakness or joint pains.  Skin:  She has no skin changes, rashes or itching Neurological:  Denies dizziness or headaches. No neuropathy, no numbness or tingling. Psychiatric:  She denies depression or anxiety. Hematologic/Lymphatic:   No easy bruising or bleeding   Physical Exam: Last menstrual period 02/21/2011. General: Well dressed, well nourished in no apparent distress.   HEENT:  Normocephalic and atraumatic, no lesions.  Extraocular muscles intact. Sclerae anicteric. Pupils equal, round, reactive. No mouth sores or ulcers. Thyroid is normal size, not nodular, midline. Abdomen:  Soft, nontender, nondistended.  No palpable masses.  No hepatosplenomegaly.  No ascites. Normal bowel sounds.  No hernias.  Incisions are well healed Genitourinary: Normal EGBUS  Vaginal cuff intact. No active bleeding or discharge but there was blood tinge to the proctoswab. Cuff in tact and mucosa in tact.  No cul de sac fullness. Extremities: No cyanosis, clubbing or edema.  No calf tenderness or erythema. No palpable cords. Psychiatric: Mood and affect are appropriate. Neurological: Awake, alert and oriented x 3. Sensation is intact, no neuropathy.  Musculoskeletal: No pain, normal strength and range of motion.   20 minutes of direct face to face counseling time was spent with the patient. This included discussion about prognosis, therapy recommendations and postoperative side effects and are beyond the scope of routine postoperative care.  Donaciano Eva, MD

## 2016-02-02 ENCOUNTER — Ambulatory Visit: Payer: Managed Care, Other (non HMO) | Admitting: Gynecologic Oncology

## 2016-02-07 ENCOUNTER — Encounter: Payer: Self-pay | Admitting: Gastroenterology

## 2016-02-07 ENCOUNTER — Ambulatory Visit (INDEPENDENT_AMBULATORY_CARE_PROVIDER_SITE_OTHER): Payer: Managed Care, Other (non HMO) | Admitting: Gastroenterology

## 2016-02-07 ENCOUNTER — Other Ambulatory Visit (INDEPENDENT_AMBULATORY_CARE_PROVIDER_SITE_OTHER): Payer: Managed Care, Other (non HMO)

## 2016-02-07 VITALS — BP 144/82 | HR 84 | Ht 63.0 in | Wt 204.0 lb

## 2016-02-07 DIAGNOSIS — K921 Melena: Secondary | ICD-10-CM

## 2016-02-07 DIAGNOSIS — K529 Noninfective gastroenteritis and colitis, unspecified: Secondary | ICD-10-CM | POA: Diagnosis not present

## 2016-02-07 DIAGNOSIS — Z1211 Encounter for screening for malignant neoplasm of colon: Secondary | ICD-10-CM

## 2016-02-07 LAB — IGA: IGA: 249 mg/dL (ref 68–378)

## 2016-02-07 MED ORDER — DICYCLOMINE HCL 10 MG PO CAPS
10.0000 mg | ORAL_CAPSULE | Freq: Three times a day (TID) | ORAL | 3 refills | Status: DC | PRN
Start: 2016-02-07 — End: 2016-02-16

## 2016-02-07 NOTE — Progress Notes (Signed)
HPI :  60 y/o female with a history of endometrial cancer, recurrent UTIs, renal stones, seen for a new patient evaluation for chronic diarrhea.  She has had a history of recurrent UTIs. She was hospitalized in 1994 for a history of "colitis", and she had thought she had E.coli in the past which caused this. Since that time she reports she usually has 2-3 BMs per day. She reports loose form at baseline. She has instances where she has more frequent bowel movements. Particular foods can precipitate symptoms. She has increased urgency and has had episodes of fecal incontinence due to urgency occasionally. She thinks this occurs sporadically. Symptoms worse with stress. She has had some occasional blood in the stools, she is not sure if due to hemorrhoids. She thinks she has gained some weight recently, no weight loss. She eats a lot of ensure to minimize symptoms. She denies any nocturnal symptoms. She is on chronic Nitrofurantoin to treat chronic UTIs. No fevers recently. She does have some cramping and abdominal discomfort at times. Having a BM can relive her discomfort. She denies using much of anything other than immodium. No NSAIDs.   Her sister has similar symptoms, unclear etiology. No known FH of IBD. No known FH of celiac disease although son is on a gluten free diet. No FH of colon cancer.   She has never had a prior colonoscopy.     Past Medical History:  Diagnosis Date  . Allergy    kiwi  . Blood transfusion without reported diagnosis 1993   Dx'd with E.Coli  . Cancer ([place]) 2017   endometrial  . Chronic kidney disease   . Headache   . History of kidney stones 2008  . Pneumonia 2015  . Tremor    left thumb     Past Surgical History:  Procedure Laterality Date  . LYMPH NODE BIOPSY Bilateral 07/13/2015   Procedure: SENTINEL LYMPH NODE BIOPSY;  Surgeon: Janie Morning, MD;  Location: [facility];  Service: Gynecology;  Laterality: Bilateral;  . ROBOTIC ASSISTED TOTAL HYSTERECTOMY  WITH BILATERAL SALPINGO OOPHERECTOMY Bilateral 07/13/2015   Procedure: XI ROBOTIC ASSISTED LAPAROSCOPIC TOTAL HYSTERECTOMY WITH BILATERAL SALPINGO OOPHORECTOMY;  Surgeon: Janie Morning, MD;  Location: [facility];  Service: Gynecology;  Laterality: Bilateral;  . TONSILLECTOMY AND ADENOIDECTOMY    . WISDOM TOOTH EXTRACTION     Family History  Problem Relation Age of Onset  . Hypertension Mother   . Hypertension Maternal Grandmother   . Stroke Maternal Grandmother   . Stomach cancer Maternal Grandmother   . Heart disease Paternal Grandfather   . Other Father     dec unknown cause age 53  . Hypertension Father   . Prostate cancer Father   . Colon cancer Neg Hx   . Rectal cancer Neg Hx   . Esophageal cancer Neg Hx   . Liver cancer Neg Hx    Social History  Substance Use Topics  . Smoking status: Former Smoker    Types: Cigarettes    Quit date: 07/04/1984  . Smokeless tobacco: Former Systems developer    Quit date: 02/21/1984  . Alcohol use 0.0 oz/week     Comment: one drink/month   Current Outpatient Prescriptions  Medication Sig Dispense Refill  . hydrochlorothiazide (HYDRODIURIL) 25 MG tablet Take 1 tablet (25 mg total) by mouth daily. 90 tablet 1  . Lactase (LACTAID PO) Take 1 tablet by mouth daily as needed (For lactose intolerance.).    Marland Kitchen nitrofurantoin, macrocrystal-monohydrate, (MACROBID) 100 MG capsule Take  1 capsule (100 mg total) by mouth daily. 90 capsule 1  . Probiotic Product (PROBIOTIC DAILY PO) Take 1 tablet by mouth daily as needed (For digestive support.). Reported on 08/09/2015     No current facility-administered medications for this visit.    Allergies  Allergen Reactions  . Kiwi Extract Swelling and Other (See Comments)    Tongue really swollen     Review of Systems: All systems reviewed and negative except where noted in HPI.   Lab Results  Component Value Date   WBC 7.4 11/26/2015   HGB 15.2 11/26/2015   HCT 43.8 11/26/2015   MCV 84.1 11/26/2015   PLT 254  11/26/2015    Lab Results  Component Value Date   CREATININE 0.72 11/26/2015   BUN 10 11/26/2015   NA 139 11/26/2015   K 4.0 11/26/2015   CL 103 11/26/2015   CO2 27 11/26/2015    Lab Results  Component Value Date   ALT 22 07/09/2015   AST 24 07/09/2015   ALKPHOS 72 07/09/2015   BILITOT 0.7 07/09/2015      Physical Exam: BP (!) 144/82   Pulse 84   Ht 5\' 3"  (1.6 m)   Wt 204 lb (92.5 kg)   LMP 02/21/2011 (Approximate)   BMI 36.14 kg/m  Constitutional: Pleasant,well-developed,female in no acute distress. HEENT: Normocephalic and atraumatic. Conjunctivae are normal. No scleral icterus. Neck supple.  Cardiovascular: Normal rate, regular rhythm.  Pulmonary/chest: Effort normal and breath sounds normal. No wheezing, rales or rhonchi. Abdominal: Soft, protuberant, nontender. There are no masses palpable. No hepatomegaly. Extremities: no edema Lymphadenopathy: No cervical adenopathy noted. Neurological: Alert and oriented to person place and time. Skin: Skin is warm and dry. No rashes noted. Psychiatric: Normal mood and affect. Behavior is normal.   ASSESSMENT AND PLAN: 60 year old female here for new patient evaluation for chronic diarrhea/hematochezia/urge incontinence. She has no prior colon cancer screening.  History as outlined above, symptoms appear chronic which would support a more functional etiology / IBS however but with worsening and incontinence which is fairly new, recommend further evaluation. Given her chronic antibiotic use I will send stools for C. difficile to ensure negative. I will also send serologies for celiac disease to ensure negative. I'm recommending a colonoscopy given she's had no prior screening, will ensure no evidence of IBD and also take biopsies to rule out microscopic colitis. I discussed risks and benefits of colonoscopy with her and she wished to proceed. In the interim recommend trial of low FODMAP diet to see if this helps, and provided her  trial of Bentyl to use as needed for cramping. If C. difficile has been ruled out she can use Imodium as needed for diarrhea which has helped in the past, until colonoscopy is performed. Further recommendations pending the results and her course.  Deer Lick Cellar, MD [facility] Pager [phone]  CC: Shawnee Knapp, MD

## 2016-02-07 NOTE — Patient Instructions (Addendum)
If you are age 60 or older, your body mass index should be between 23-30. Your Body mass index is 36.14 kg/m. If this is out of the aforementioned range listed, please consider follow up with your Primary Care Provider.  If you are age 69 or younger, your body mass index should be between 19-25. Your Body mass index is 36.14 kg/m. If this is out of the aformentioned range listed, please consider follow up with your Primary Care Provider.   We have sent the following medications to your pharmacy for you to pick up at your convenience:  [place] have been scheduled for a colonoscopy. Please follow written instructions given to you at your visit today.  Please pick up your prep supplies at the pharmacy within the next 1-3 days. If you use inhalers (even only as needed), please bring them with you on the day of your procedure. Your physician has requested that you go to www.startemmi.com and enter the access code given to you at your visit today. This web site gives a general overview about your procedure. However, you should still follow specific instructions given to you by our office regarding your preparation for the procedure.  Your physician has requested that you go to the basement for the following lab work before leaving today:  IGA, TTG, Stool-diff PCR  Use Immodium as needed.  You have been given a Low FodMap diet.  Thank you

## 2016-02-08 ENCOUNTER — Other Ambulatory Visit: Payer: Managed Care, Other (non HMO)

## 2016-02-08 ENCOUNTER — Encounter: Payer: Self-pay | Admitting: Gastroenterology

## 2016-02-08 DIAGNOSIS — K921 Melena: Secondary | ICD-10-CM

## 2016-02-08 DIAGNOSIS — K529 Noninfective gastroenteritis and colitis, unspecified: Secondary | ICD-10-CM

## 2016-02-08 DIAGNOSIS — Z1211 Encounter for screening for malignant neoplasm of colon: Secondary | ICD-10-CM

## 2016-02-08 LAB — TISSUE TRANSGLUTAMINASE, IGA: TISSUE TRANSGLUTAMINASE AB, IGA: 1 U/mL (ref <4)

## 2016-02-09 ENCOUNTER — Telehealth: Payer: Self-pay

## 2016-02-09 LAB — CLOSTRIDIUM DIFFICILE BY PCR: CDIFFPCR: NOT DETECTED

## 2016-02-09 NOTE — Telephone Encounter (Signed)
Pt informed of the neg results and to use Immodium prn.

## 2016-02-09 NOTE — Telephone Encounter (Signed)
Pt informed of results.

## 2016-02-09 NOTE — Telephone Encounter (Signed)
-----   Message from Manus Gunning, MD sent at 02/09/2016  3:58 PM EST ----- Caryl Pina can you please let this patient know she tested negative for C Diff. She can take immodium PRN for her diarrhea and we will await results of the colonoscopy. Thanks

## 2016-02-09 NOTE — Telephone Encounter (Signed)
-----   Message from Manus Gunning, MD sent at 02/08/2016  1:14 PM EST ----- Caryl Pina can you please let this patient know her celiac lab testing is NEGATIVE, making it unlikely she has celiac. We will await results of her stool study and her pending colonoscopy. Thanks

## 2016-02-15 NOTE — Progress Notes (Signed)
POSTOPERATIVE FOLLOW-UP : ENDOMETRIAL CANCER  Assessment:    60 y.o. year old with Stage IA Grade 1 endometrioid endometrial cancer.   S/p robotic hysterectomy, BSO, SLN biopsy on 07/13/15. no LVSI, 20% myometrial invasion, and negative lymph nodes. Low risk factors for recurrence. No adjuvant therapy recommended.   Plan: 1) Recommendation is for routine surveillance with frequent pelvic exams every 6 months x 5 years, at which time she can return to annual visits.  Discussed signs and symptoms of recurrence including vaginal bleeding or discharge, leg pain or swelling and changes in bowel or bladder habits.Marland Kitchen  3)  Return to clinic in 12 months to see Dr Skeet Latch or myself and in 6 months to see Dr Quincy Simmonds.  HPI:  Kimberly Griffith is a 60 y.o. year old [id number] initially seen in consultation on 07/05/15 referred by Dr Quincy Simmonds for grade 1 endometrial cancer.  She then underwent a robotic hysterectomy, BSO, SLN biopsy with Dr Skeet Latch on [id number] without complications.  Her postoperative course was uncomplicated.  Her final pathologic diagnosis is a Stage IA Grade 1 endometrioid endometrial cancer with no lymphovascular space invasion, 3/13 mm (20%) of myometrial invasion and negative lymph nodes.  Interval Hx: The patient has done well with no complaints. She denies vaginal bleeding. She has no pain.  Review of systems: Constitutional:  She has no weight gain or weight loss. She has no fever or chills. Eyes: No blurred vision Ears, Nose, Mouth, Throat: No dizziness, headaches or changes in hearing. No mouth sores. Cardiovascular: No chest pain, palpitations or edema. Respiratory:  No shortness of breath, wheezing or cough Gastrointestinal: She has normal bowel movements without diarrhea or constipation. She denies any nausea or vomiting. She denies blood in her stool or heart burn. Genitourinary:  She denies pelvic pain, pelvic pressure or changes in her urinary function. She has no hematuria, dysuria, or  incontinence. She has no irregular vaginal bleeding or vaginal discharge Musculoskeletal: Denies muscle weakness or joint pains.  Skin:  She has no skin changes, rashes or itching Neurological:  Denies dizziness or headaches. No neuropathy, no numbness or tingling. Psychiatric:  She denies depression or anxiety. Hematologic/Lymphatic:   No easy bruising or bleeding   Physical Exam: Last menstrual period 02/21/2011. General: Well dressed, well nourished in no apparent distress.   HEENT:  Normocephalic and atraumatic, no lesions.  Extraocular muscles intact. Sclerae anicteric. Pupils equal, round, reactive. No mouth sores or ulcers. Thyroid is normal size, not nodular, midline. Abdomen:  Soft, nontender, nondistended.  No palpable masses.  No hepatosplenomegaly.  No ascites. Normal bowel sounds.  No hernias.  Incisions are well healed Genitourinary: Normal EGBUS  Vaginal cuff intact. No active bleeding or discharge. Cuff in tact and mucosa in tact.  No cul de sac fullness. Extremities: No cyanosis, clubbing or edema.  No calf tenderness or erythema. No palpable cords. Psychiatric: Mood and affect are appropriate. Neurological: Awake, alert and oriented x 3. Sensation is intact, no neuropathy.  Musculoskeletal: No pain, normal strength and range of motion.   Donaciano Eva, MD

## 2016-02-16 ENCOUNTER — Encounter: Payer: Self-pay | Admitting: Gynecologic Oncology

## 2016-02-16 ENCOUNTER — Ambulatory Visit: Payer: Managed Care, Other (non HMO) | Attending: Gynecologic Oncology | Admitting: Gynecologic Oncology

## 2016-02-16 VITALS — BP 132/83 | HR 68 | Temp 97.9°F | Resp 18 | Ht 63.0 in | Wt 207.4 lb

## 2016-02-16 DIAGNOSIS — Z90722 Acquired absence of ovaries, bilateral: Secondary | ICD-10-CM | POA: Diagnosis not present

## 2016-02-16 DIAGNOSIS — Z90710 Acquired absence of both cervix and uterus: Secondary | ICD-10-CM | POA: Diagnosis not present

## 2016-02-16 DIAGNOSIS — C541 Malignant neoplasm of endometrium: Secondary | ICD-10-CM | POA: Diagnosis not present

## 2016-02-16 NOTE — Patient Instructions (Signed)
Call our office in June to schedule appointment for December 2018.

## 2016-02-21 HISTORY — PX: COLONOSCOPY: SHX174

## 2016-02-24 ENCOUNTER — Ambulatory Visit: Payer: Managed Care, Other (non HMO) | Admitting: Gastroenterology

## 2016-02-24 ENCOUNTER — Telehealth: Payer: Self-pay | Admitting: Gastroenterology

## 2016-02-25 ENCOUNTER — Other Ambulatory Visit: Payer: Self-pay

## 2016-02-25 MED ORDER — NA SULFATE-K SULFATE-MG SULF 17.5-3.13-1.6 GM/177ML PO SOLN: 1.0000 | Freq: Once | ORAL | 0 refills | Status: AC

## 2016-02-25 NOTE — Telephone Encounter (Signed)
Suprep sent to [facility] in [place]

## 2016-02-28 ENCOUNTER — Encounter: Payer: Self-pay | Admitting: Gastroenterology

## 2016-02-28 ENCOUNTER — Ambulatory Visit (AMBULATORY_SURGERY_CENTER): Payer: Managed Care, Other (non HMO) | Admitting: Gastroenterology

## 2016-02-28 VITALS — BP 140/70 | HR 72 | Temp 98.4°F | Resp 13 | Ht 63.0 in | Wt 207.0 lb

## 2016-02-28 DIAGNOSIS — R197 Diarrhea, unspecified: Secondary | ICD-10-CM

## 2016-02-28 DIAGNOSIS — D122 Benign neoplasm of ascending colon: Secondary | ICD-10-CM

## 2016-02-28 DIAGNOSIS — Z1211 Encounter for screening for malignant neoplasm of colon: Secondary | ICD-10-CM

## 2016-02-28 DIAGNOSIS — D123 Benign neoplasm of transverse colon: Secondary | ICD-10-CM

## 2016-02-28 MED ORDER — SODIUM CHLORIDE 0.9 % IV SOLN: 500.0000 mL | INTRAVENOUS | Status: DC

## 2016-02-28 NOTE — Op Note (Signed)
[facility] Patient Name: Kimberly Griffith Procedure Date: 02/28/2016 1:50 PM MRN: [id number] Endoscopist: Remo Lipps P. Armbruster MD, MD Age: 61 Referring MD:  Date of Birth: 1955-03-29 Gender: Female Account #: [id number] Procedure:                Colonoscopy Indications:              This is the patient's first colonoscopy, Chronic                            diarrhea, screening Medicines:                Monitored Anesthesia Care Procedure:                Pre-Anesthesia Assessment:                           - Prior to the procedure, a History and Physical                            was performed, and patient medications and                            allergies were reviewed. The patient's tolerance of                            previous anesthesia was also reviewed. The risks                            and benefits of the procedure and the sedation                            options and risks were discussed with the patient.                            All questions were answered, and informed consent                            was obtained. Prior Anticoagulants: The patient has                            taken no previous anticoagulant or antiplatelet                            agents. ASA Grade Assessment: II - A patient with                            mild systemic disease. After reviewing the risks                            and benefits, the patient was deemed in                            satisfactory condition to undergo the procedure.  After obtaining informed consent, the colonoscope                            was passed under direct vision. Throughout the                            procedure, the patient's blood pressure, pulse, and                            oxygen saturations were monitored continuously. The                            Model CF-HQ190L [id number]) scope was introduced                            through the anus and advanced to  the the terminal                            ileum, with identification of the appendiceal                            orifice and IC valve. The colonoscopy was performed                            without difficulty. The patient tolerated the                            procedure well. The quality of the bowel                            preparation was adequate. The terminal ileum,                            ileocecal valve, appendiceal orifice, and rectum                            were photographed. Scope In: 1:59:47 PM Scope Out: 2:19:38 PM Scope Withdrawal Time: 0 hours 14 minutes 50 seconds  Total Procedure Duration: 0 hours 19 minutes 51 seconds  Findings:                 The perianal and digital rectal examinations were                            normal.                           Two sessile polyps were found in the ascending                            colon. The polyps were 4 to 6 mm in size. These                            polyps were removed with a cold snare. Resection  and retrieval were complete.                           Two flat and sessile polyps were found in the                            hepatic flexure. The polyps were 6 to 8 mm in size.                            These polyps were removed with a cold snare.                            Resection and retrieval were complete.                           A 5 mm polyp was found in the transverse colon. The                            polyp was flat. The polyp was removed with a cold                            snare. Resection and retrieval were complete.                           Multiple medium-mouthed diverticula were found in                            the left colon.                           Internal hemorrhoids were found during retroflexion.                           The terminal ileum appeared normal.                           The exam was otherwise without abnormality.                            Biopsies for histology were taken with a cold                            forceps from the right colon and left colon for                            evaluation of microscopic colitis. Complications:            No immediate complications. Estimated blood loss:                            Minimal. Estimated Blood Loss:     Estimated blood loss was minimal. Impression:               - Two 4 to 6 mm polyps in the ascending colon,  removed with a cold snare. Resected and retrieved.                           - Two 6 to 8 mm polyps at the hepatic flexure,                            removed with a cold snare. Resected and retrieved.                           - One 5 mm polyp in the transverse colon, removed                            with a cold snare. Resected and retrieved.                           - Diverticulosis in the left colon.                           - Internal hemorrhoids.                           - The examined portion of the ileum was normal.                           - The examination was otherwise normal.                           - Biopsies were taken with a cold forceps from the                            right colon and left colon for evaluation of                            microscopic colitis. Recommendation:           - Patient has a contact number available for                            emergencies. The signs and symptoms of potential                            delayed complications were discussed with the                            patient. Return to normal activities tomorrow.                            Written discharge instructions were provided to the                            patient.                           - Resume previous diet.                           -  Continue present medications.                           - No ibuprofen, naproxen, or other non-steroidal                            anti-inflammatory drugs for 2 weeks after polyp                             removal.                           - Await pathology results.                           - Repeat colonoscopy is recommended for                            surveillance. The colonoscopy date will be                            determined after pathology results from today's                            exam become available for review. Remo Lipps P. Armbruster MD, MD 02/28/2016 2:29:52 PM This report has been signed electronically.

## 2016-02-28 NOTE — Progress Notes (Signed)
Report to PACU, RN, vss, BBS= Clear.  

## 2016-02-28 NOTE — Progress Notes (Signed)
Called to room to assist during endoscopic procedure.  Patient ID and intended procedure confirmed with present staff. Received instructions for my participation in the procedure from the performing physician.  

## 2016-02-28 NOTE — Patient Instructions (Addendum)
YOU HAD AN ENDOSCOPIC PROCEDURE TODAY AT [facility]:   Refer to the procedure report that was given to you for any specific questions about what was found during the examination.  If the procedure report does not answer your questions, please call your gastroenterologist to clarify.  If you requested that your care partner not be given the details of your procedure findings, then the procedure report has been included in a sealed envelope for you to review at your convenience later.  YOU SHOULD EXPECT: Some feelings of bloating in the abdomen. Passage of more gas than usual.  Walking can help get rid of the air that was put into your GI tract during the procedure and reduce the bloating. If you had a lower endoscopy (such as a colonoscopy or flexible sigmoidoscopy) you may notice spotting of blood in your stool or on the toilet paper. If you underwent a bowel prep for your procedure, you may not have a normal bowel movement for a few days.  Please Note:  You might notice some irritation and congestion in your nose or some drainage.  This is from the oxygen used during your procedure.  There is no need for concern and it should clear up in a day or so.  SYMPTOMS TO REPORT IMMEDIATELY:   Following lower endoscopy (colonoscopy or flexible sigmoidoscopy):  Excessive amounts of blood in the stool  Significant tenderness or worsening of abdominal pains  Swelling of the abdomen that is new, acute  Fever of 100F or higher  For urgent or emergent issues, a gastroenterologist can be reached at any hour by calling [phone].   DIET:  We do recommend a small meal at first, but then you may proceed to your regular diet.  Drink plenty of fluids but you should avoid alcoholic beverages for 24 hours.  ACTIVITY:  You should plan to take it easy for the rest of today and you should NOT DRIVE or use heavy machinery until tomorrow (because of the sedation medicines used during the test).     FOLLOW UP: Our staff will call the number listed on your records the next business day following your procedure to check on you and address any questions or concerns that you may have regarding the information given to you following your procedure. If we do not reach you, we will leave a message.  However, if you are feeling well and you are not experiencing any problems, there is no need to return our call.  We will assume that you have returned to your regular daily activities without incident.  If any biopsies were taken you will be contacted by phone or by letter within the next 1-3 weeks.  Please call us at [phone] if you have not heard about the biopsies in 3 weeks.   Diverticulosis (handout given) Hemorrhoids (handout given) Polyps (handout given) Await for biopsy results to determined next Colonoscopy No Ibuprofen, Naproxen or non-steriodal anti-inflammatory for two weeks after removing polps  SIGNATURES/CONFIDENTIALITY: You and/or your care partner have signed paperwork which will be entered into your electronic medical record.  These signatures attest to the fact that that the information above on your After Visit Summary has been reviewed and is understood.  Full responsibility of the confidentiality of this discharge information lies with you and/or your care-partner.

## 2016-02-29 ENCOUNTER — Telehealth: Payer: Self-pay | Admitting: *Deleted

## 2016-02-29 NOTE — Telephone Encounter (Signed)
  Follow up Call-  Call back number 02/28/2016  Post procedure Call Back phone  # [phone]  Permission to leave phone message Yes  Some recent data might be hidden     Patient questions:  Do you have a fever, pain , or abdominal swelling? No. Pain Score  0 *  Have you tolerated food without any problems? Yes.    Have you been able to return to your normal activities? Yes.    Do you have any questions about your discharge instructions: Diet   No. Medications  No. Follow up visit  No.  Do you have questions or concerns about your Care? No.  Actions: * If pain score is 4 or above: No action needed, pain <4.  Pt. Was still sleeping information provide via husband.

## 2016-05-27 ENCOUNTER — Other Ambulatory Visit: Payer: Self-pay | Admitting: Family Medicine

## 2016-07-03 ENCOUNTER — Other Ambulatory Visit: Payer: Self-pay | Admitting: Family Medicine

## 2016-07-04 NOTE — Telephone Encounter (Signed)
Pt is long-overdue for a f/u OV with FASTING labs. Please have her sched an appt within the next month so we can continue to make sure the medicines are keeping her healthy and safe before we refill them further. Thanks. 

## 2016-07-05 NOTE — Telephone Encounter (Signed)
Mychart message sent to patient about making an appointment.

## 2016-07-14 ENCOUNTER — Encounter: Payer: Self-pay | Admitting: *Deleted

## 2016-07-14 ENCOUNTER — Emergency Department (INDEPENDENT_AMBULATORY_CARE_PROVIDER_SITE_OTHER)
Admission: EM | Admit: 2016-07-14 | Discharge: 2016-07-14 | Disposition: A | Discharge status: Home / Self Care | Payer: Managed Care, Other (non HMO) | Source: Home / Self Care | Attending: Family Medicine | Admitting: Family Medicine

## 2016-07-14 DIAGNOSIS — R300 Dysuria: Secondary | ICD-10-CM

## 2016-07-14 LAB — POCT URINALYSIS DIP (MANUAL ENTRY)
NITRITE UA: POSITIVE — AB
Protein Ur, POC: 30 mg/dL — AB
Spec Grav, UA: <=1.005 — AB (ref 1.010–1.025)
Urobilinogen, UA: 2 E.U./dL — AB
pH, UA: 5 (ref 5.0–8.0)

## 2016-07-14 MED ORDER — CEPHALEXIN 500 MG PO CAPS: 500.0000 mg | ORAL_CAPSULE | Freq: Two times a day (BID) | ORAL | 0 refills | Status: DC

## 2016-07-14 NOTE — Discharge Instructions (Signed)
Increase fluid intake. May use non-prescription AZO for about two days, if desired, to decrease urinary discomfort.  If symptoms become significantly worse during the night or over the weekend, proceed to the local emergency room.  

## 2016-07-14 NOTE — ED Triage Notes (Signed)
Patient reports dysuria with back pain, nausea and chills x 3am this AM. Taken pyridium

## 2016-07-14 NOTE — ED Provider Notes (Signed)
[facility]    CSN: [id number] Arrival date & time: 07/14/16  1254     History   Chief Complaint Chief Complaint  Patient presents with  . Urinary Tract Infection    HPI Kimberly Griffith is a 61 y.o. female.   Patient awoke at 3am today with urinary urgency and right low back pain.  She has noted cloudy urine.  She had chills last night, and nausea this morning without vomiting.  She has a pressure-like sensation over her bladder.   The history is provided by the patient.  Dysuria  Pain quality:  Burning Pain severity:  Mild Onset quality:  Sudden Duration:  11 hours Timing:  Constant Progression:  Worsening Chronicity:  Recurrent Recent urinary tract infections: no   Relieved by:  Phenazopyridine Worsened by:  Nothing Ineffective treatments:  None tried Urinary symptoms: discolored urine, frequent urination and hesitancy   Urinary symptoms: no foul-smelling urine, no hematuria and no bladder incontinence   Associated symptoms: abdominal pain, flank pain and nausea   Associated symptoms: no fever, no vaginal discharge and no vomiting   Risk factors: recurrent urinary tract infections     Past Medical History:  Diagnosis Date  . Allergy    kiwi  . Blood transfusion without reported diagnosis 1993   Dx'd with E.Coli  . Cancer ([place]) 2017   endometrial  . Chronic kidney disease   . Headache   . History of kidney stones 2008  . Pneumonia 2015  . Tremor    left thumb    Patient Active Problem List   Diagnosis Date Noted  . Endometrial cancer ([place]) 07/05/2015    Past Surgical History:  Procedure Laterality Date  . LYMPH NODE BIOPSY Bilateral 07/13/2015   Procedure: SENTINEL LYMPH NODE BIOPSY;  Surgeon: Janie Morning, MD;  Location: [facility];  Service: Gynecology;  Laterality: Bilateral;  . ROBOTIC ASSISTED TOTAL HYSTERECTOMY WITH BILATERAL SALPINGO OOPHERECTOMY Bilateral 07/13/2015   Procedure: XI ROBOTIC ASSISTED LAPAROSCOPIC TOTAL HYSTERECTOMY WITH  BILATERAL SALPINGO OOPHORECTOMY;  Surgeon: Janie Morning, MD;  Location: [facility];  Service: Gynecology;  Laterality: Bilateral;  . TONSILLECTOMY AND ADENOIDECTOMY    . WISDOM TOOTH EXTRACTION      OB History    Gravida Para Term Preterm AB Living   7 6     1 6    SAB TAB Ectopic Multiple Live Births   1               Home Medications    Prior to Admission medications   Medication Sig Start Date End Date Taking? Authorizing Provider  cephALEXin (KEFLEX) 500 MG capsule Take 1 capsule (500 mg total) by mouth 2 (two) times daily. 07/14/16   Kandra Nicolas, MD  Cranberry 180 MG CAPS Take by mouth.    [provider]  hydrochlorothiazide (HYDRODIURIL) 25 MG tablet Take 1 tablet (25 mg total) by mouth daily. Need office visit for any more refills. 07/04/16   Shawnee Knapp, MD  Lactase (LACTAID PO) Take 1 tablet by mouth daily as needed (For lactose intolerance.).    [provider]  Multiple Vitamins-Minerals (MULTIVITAMIN WITH MINERALS) tablet Take 1 tablet by mouth daily.    [provider]  nitrofurantoin, macrocrystal-monohydrate, (MACROBID) 100 MG capsule Take 1 capsule (100 mg total) by mouth daily. 11/26/15   Shawnee Knapp, MD  Probiotic Product (PROBIOTIC DAILY PO) Take 1 tablet by mouth daily as needed (For digestive support.). Reported on 08/09/2015    [provider]    Family History Family History  Problem Relation Age of Onset  . Hypertension Mother   . Hypertension Maternal Grandmother   . Stroke Maternal Grandmother   . Stomach cancer Maternal Grandmother   . Heart disease Paternal Grandfather   . Other Father        dec unknown cause age 44  . Hypertension Father   . Prostate cancer Father   . Colon cancer Neg Hx   . Rectal cancer Neg Hx   . Esophageal cancer Neg Hx   . Liver cancer Neg Hx     Social History Social History  Substance Use Topics  . Smoking status: Former Smoker    Types: Cigarettes    Quit date: 07/04/1984  .  Smokeless tobacco: Former Systems developer    Quit date: 02/21/1984  . Alcohol use Not on file     Comment: one drink/month     Allergies   Kiwi extract   Review of Systems Review of Systems  Constitutional: Negative for fever.  Gastrointestinal: Positive for abdominal pain and nausea. Negative for vomiting.  Genitourinary: Positive for dysuria and flank pain. Negative for vaginal discharge.  All other systems reviewed and are negative.    Physical Exam Triage Vital Signs ED Triage Vitals [07/14/16 1306]  Enc Vitals Group     BP (!) 141/83     Pulse Rate 80     Resp 14     Temp 98.6 F (37 C)     Temp Source Oral     SpO2 98 %     Weight 204 lb (92.5 kg)     Height      Head Circumference      Peak Flow      Pain Score 7     Pain Loc      Pain Edu?      Excl. in [place]?    No data found.   Updated Vital Signs BP (!) 141/83 (BP Location: Left Arm)   Pulse 80   Temp 98.6 F (37 C) (Oral)   Resp 14   Wt 204 lb (92.5 kg)   LMP 02/21/2011 (Approximate)   SpO2 98%   BMI 36.14 kg/m   Visual Acuity Right Eye Distance:   Left Eye Distance:   Bilateral Distance:    Right Eye Near:   Left Eye Near:    Bilateral Near:     Physical Exam Nursing notes and Vital Signs reviewed. Appearance:  Patient appears stated age, and in no acute distress.    Eyes:  Pupils are equal, round, and reactive to light and accomodation.  Extraocular movement is intact.  Conjunctivae are not inflamed   Pharynx:  Normal; moist mucous membranes  Neck:  Supple.  No adenopathy Lungs:  Clear to auscultation.  Breath sounds are equal.  Moving air well. Heart:  Regular rate and rhythm without murmurs, rubs, or gallops.  Abdomen:  Nontender without masses or hepatosplenomegaly.  Bowel sounds are present.  No CVA or flank tenderness.  Extremities:  No edema.  Skin:  No rash present.     UC Treatments / Results  Labs (all labs ordered are listed, but only abnormal results are displayed) Labs Reviewed    POCT URINALYSIS DIP (MANUAL ENTRY) - Abnormal; Notable for the following:       Result Value   Color, UA orange (*)    Clarity, UA cloudy (*)    Glucose, UA =100 (*)    Bilirubin, UA  small (*)    Ketones, POC UA trace (5) (*)    Spec Grav, UA <=1.005 (*)    Blood, UA small (*)    Protein Ur, POC =30 (*)    Urobilinogen, UA 2.0 (*)    Nitrite, UA Positive (*)    Leukocytes, UA Large (3+) (*)    All other components within normal limits  URINE CULTURE    EKG  EKG Interpretation None       Radiology No results found.  Procedures Procedures (including critical care time)  Medications Ordered in UC Medications - No data to display   Initial Impression / Assessment and Plan / UC Course  I have reviewed the triage vital signs and the nursing notes.  Pertinent labs & imaging results that were available during my care of the patient were reviewed by me and considered in my medical decision making (see chart for details).    Urine culture pending. Begin empiric Keflex. Increase fluid intake. May use non-prescription AZO for about two days, if desired, to decrease urinary discomfort. If symptoms become significantly worse during the night or over the weekend, proceed to the local emergency room.     Final Clinical Impressions(s) / UC Diagnoses   Final diagnoses:  Dysuria    New Prescriptions Discharge Medication List as of 07/14/2016  1:29 PM    START taking these medications   Details  cephALEXin (KEFLEX) 500 MG capsule Take 1 capsule (500 mg total) by mouth 2 (two) times daily., Starting Fri 07/14/2016, Normal         Kandra Nicolas, MD 07/14/16 1335

## 2016-07-15 LAB — URINE CULTURE

## 2016-07-16 ENCOUNTER — Telehealth: Payer: Self-pay | Admitting: Emergency Medicine

## 2016-07-16 NOTE — Telephone Encounter (Signed)
Pt advised of negative urine culture results.  Will continue medication and follow up as prn.  TMartin,CMA

## 2016-08-05 ENCOUNTER — Other Ambulatory Visit: Payer: Self-pay | Admitting: Family Medicine

## 2017-01-14 ENCOUNTER — Encounter: Payer: Self-pay | Admitting: Emergency Medicine

## 2017-01-14 ENCOUNTER — Emergency Department (INDEPENDENT_AMBULATORY_CARE_PROVIDER_SITE_OTHER)
Admission: EM | Admit: 2017-01-14 | Discharge: 2017-01-14 | Disposition: A | Discharge status: Home / Self Care | Payer: Managed Care, Other (non HMO) | Source: Home / Self Care | Attending: Family Medicine | Admitting: Family Medicine

## 2017-01-14 DIAGNOSIS — N3090 Cystitis, unspecified without hematuria: Secondary | ICD-10-CM | POA: Diagnosis not present

## 2017-01-14 LAB — POCT URINALYSIS DIP (MANUAL ENTRY)
Bilirubin, UA: NEGATIVE
Glucose, UA: NEGATIVE mg/dL
Ketones, POC UA: NEGATIVE mg/dL
NITRITE UA: POSITIVE — AB
PH UA: 5.5 (ref 5.0–8.0)
Spec Grav, UA: 1.02 (ref 1.010–1.025)
UROBILINOGEN UA: 0.2 U/dL

## 2017-01-14 MED ORDER — CEPHALEXIN 500 MG PO CAPS: 500.0000 mg | ORAL_CAPSULE | Freq: Two times a day (BID) | ORAL | 0 refills | Status: DC

## 2017-01-14 NOTE — Discharge Instructions (Signed)
May use non-prescription AZO for about two days, if desired, to decrease urinary discomfort.  Increase fluid intake.  If symptoms become significantly worse during the night or over the weekend, proceed to the local emergency room.  

## 2017-01-14 NOTE — ED Provider Notes (Signed)
[facility]    CSN: [id number] Arrival date & time: 01/14/17  1154     History   Chief Complaint Chief Complaint  Patient presents with  . Dysuria  . Urinary Urgency  . Urinary Frequency    HPI Kimberly Griffith is a 61 y.o. female.   Patient complains of 6 day history of lower abdominal pressure, frequency, nocturia, and urgency.  Last night she had chills and lower back ache.   The history is provided by the patient.  Dysuria  Pain quality:  Burning Pain severity:  Mild Onset quality:  Gradual Duration:  6 days Timing:  Constant Progression:  Worsening Chronicity:  New Recent urinary tract infections: no   Relieved by:  Nothing Worsened by:  Nothing Ineffective treatments:  Phenazopyridine Urinary symptoms: frequent urination and hesitancy   Urinary symptoms: no discolored urine, no foul-smelling urine, no hematuria and no bladder incontinence   Associated symptoms: no abdominal pain, no fever, no flank pain, no genital lesions, no nausea, no vaginal discharge and no vomiting   Risk factors: recurrent urinary tract infections     Past Medical History:  Diagnosis Date  . Allergy    kiwi  . Blood transfusion without reported diagnosis 1993   Dx'd with E.Coli  . Cancer ([place]) 2017   endometrial  . Chronic kidney disease   . Headache   . History of kidney stones 2008  . Pneumonia 2015  . Tremor    left thumb    Patient Active Problem List   Diagnosis Date Noted  . Endometrial cancer ([place]) 07/05/2015    Past Surgical History:  Procedure Laterality Date  . LYMPH NODE BIOPSY Bilateral 07/13/2015   Procedure: SENTINEL LYMPH NODE BIOPSY;  Surgeon: Janie Morning, MD;  Location: [facility];  Service: Gynecology;  Laterality: Bilateral;  . ROBOTIC ASSISTED TOTAL HYSTERECTOMY WITH BILATERAL SALPINGO OOPHERECTOMY Bilateral 07/13/2015   Procedure: XI ROBOTIC ASSISTED LAPAROSCOPIC TOTAL HYSTERECTOMY WITH BILATERAL SALPINGO OOPHORECTOMY;  Surgeon: Janie Morning, MD;  Location: [facility];  Service: Gynecology;  Laterality: Bilateral;  . TONSILLECTOMY AND ADENOIDECTOMY    . WISDOM TOOTH EXTRACTION      OB History    Gravida Para Term Preterm AB Living   7 6     1 6    SAB TAB Ectopic Multiple Live Births   1               Home Medications    Prior to Admission medications   Medication Sig Start Date End Date Taking? Authorizing Provider  cephALEXin (KEFLEX) 500 MG capsule Take 1 capsule (500 mg total) by mouth 2 (two) times daily. 01/14/17   Kandra Nicolas, MD  Cranberry 180 MG CAPS Take by mouth.    [provider]  hydrochlorothiazide (HYDRODIURIL) 25 MG tablet Take 1 tablet (25 mg total) by mouth daily. Need office visit for any more refills. 07/04/16   Shawnee Knapp, MD  Lactase (LACTAID PO) Take 1 tablet by mouth daily as needed (For lactose intolerance.).    [provider]  Multiple Vitamins-Minerals (MULTIVITAMIN WITH MINERALS) tablet Take 1 tablet by mouth daily.    [provider]  nitrofurantoin, macrocrystal-monohydrate, (MACROBID) 100 MG capsule Take 1 capsule (100 mg total) by mouth daily. 11/26/15   Shawnee Knapp, MD  Probiotic Product (PROBIOTIC DAILY PO) Take 1 tablet by mouth daily as needed (For digestive support.). Reported on 08/09/2015    [provider]    Family History Family History  Problem Relation Age of Onset  . Hypertension Mother   . Hypertension Maternal Grandmother   . Stroke Maternal Grandmother   . Stomach cancer Maternal Grandmother   . Heart disease Paternal Grandfather   . Other Father        dec unknown cause age 1  . Hypertension Father   . Prostate cancer Father   . Colon cancer Neg Hx   . Rectal cancer Neg Hx   . Esophageal cancer Neg Hx   . Liver cancer Neg Hx     Social History Social History   Tobacco Use  . Smoking status: Former Smoker    Types: Cigarettes    Last attempt to quit: 07/04/1984    Years since quitting: 32.5  . Smokeless  tobacco: Former Systems developer    Quit date: 02/21/1984  Substance Use Topics  . Alcohol use: Not on file    Comment: one drink/month  . Drug use: Not on file     Allergies   Kiwi extract   Review of Systems Review of Systems  Constitutional: Negative for fever.  Gastrointestinal: Negative for abdominal pain, nausea and vomiting.  Genitourinary: Positive for dysuria. Negative for flank pain and vaginal discharge.  All other systems reviewed and are negative.    Physical Exam Triage Vital Signs ED Triage Vitals  Enc Vitals Group     BP 01/14/17 1227 (!) 164/93     Pulse Rate 01/14/17 1227 79     Resp 01/14/17 1227 16     Temp 01/14/17 1227 98.4 F (36.9 C)     Temp Source 01/14/17 1227 Oral     SpO2 01/14/17 1227 98 %     Weight 01/14/17 1228 205 lb (93 kg)     Height 01/14/17 1228 5\' 4"  (1.626 m)     Head Circumference --      Peak Flow --      Pain Score 01/14/17 1228 7     Pain Loc --      Pain Edu? --      Excl. in [place]? --    No data found.  Updated Vital Signs BP (!) 164/93 (BP Location: Right Arm)   Pulse 79   Temp 98.4 F (36.9 C) (Oral)   Resp 16   Ht 5\' 4"  (1.626 m)   Wt 205 lb (93 kg)   LMP 02/21/2011 (Approximate)   SpO2 98%   BMI 35.19 kg/m   Visual Acuity Right Eye Distance:   Left Eye Distance:   Bilateral Distance:    Right Eye Near:   Left Eye Near:    Bilateral Near:     Physical Exam Nursing notes and Vital Signs reviewed. Appearance:  Patient appears stated age, and in no acute distress.    Eyes:  Pupils are equal, round, and reactive to light and accomodation.  Extraocular movement is intact.  Conjunctivae are not inflamed   Pharynx:  Normal; moist mucous membranes  Neck:  Supple.  No adenopathy Lungs:  Clear to auscultation.  Breath sounds are equal.  Moving air well. Heart:  Regular rate and rhythm without murmurs, rubs, or gallops.  Abdomen:  Nontender without masses or hepatosplenomegaly.  Bowel sounds are present.  No CVA or flank  tenderness.  Extremities:  No edema.  Skin:  No rash present.     UC Treatments / Results  Labs (all labs ordered are listed, but only abnormal results are displayed) Labs Reviewed  POCT URINALYSIS DIP (MANUAL ENTRY) - Abnormal;  Notable for the following components:      Result Value   Clarity, UA cloudy (*)    Blood, UA moderate (*)    Protein Ur, POC trace (*)    Nitrite, UA Positive (*)    Leukocytes, UA Large (3+) (*)    All other components within normal limits  URINE CULTURE    EKG  EKG Interpretation None       Radiology No results found.  Procedures Procedures (including critical care time)  Medications Ordered in UC Medications - No data to display   Initial Impression / Assessment and Plan / UC Course  I have reviewed the triage vital signs and the nursing notes.  Pertinent labs & imaging results that were available during my care of the patient were reviewed by me and considered in my medical decision making (see chart for details).    Urine culture pending. Begin Keflex 500mg  BID for one week. Increase fluid intake. May use non-prescription AZO for about two days, if desired, to decrease urinary discomfort.  If symptoms become significantly worse during the night or over the weekend, proceed to the local emergency room.  Followup with Family Doctor if not improved in one week.     Final Clinical Impressions(s) / UC Diagnoses   Final diagnoses:  Cystitis    ED Discharge Orders        Ordered    cephALEXin (KEFLEX) 500 MG capsule  2 times daily     01/14/17 1359           Kandra Nicolas, MD 01/15/17 1404

## 2017-01-14 NOTE — ED Triage Notes (Signed)
Patient presents to [facility] with C/O urinary frequency, urgency,and dysuria since Tuesday. History of reoccurring UTI

## 2017-01-21 LAB — URINE CULTURE
MICRO NUMBER: 81322774
SPECIMEN QUALITY:: ADEQUATE

## 2017-01-23 ENCOUNTER — Telehealth: Payer: Self-pay | Admitting: *Deleted

## 2017-01-23 NOTE — Telephone Encounter (Signed)
Spoke to pt given UCx results. She reports she is feeling better.

## 2017-01-23 NOTE — Telephone Encounter (Signed)
LM to call back for UCx results.

## 2017-10-09 ENCOUNTER — Emergency Department (INDEPENDENT_AMBULATORY_CARE_PROVIDER_SITE_OTHER)
Admission: EM | Admit: 2017-10-09 | Discharge: 2017-10-09 | Disposition: A | Discharge status: Home / Self Care | Payer: Managed Care, Other (non HMO) | Source: Home / Self Care | Attending: Family Medicine | Admitting: Family Medicine

## 2017-10-09 DIAGNOSIS — K047 Periapical abscess without sinus: Secondary | ICD-10-CM | POA: Diagnosis not present

## 2017-10-09 DIAGNOSIS — K029 Dental caries, unspecified: Secondary | ICD-10-CM

## 2017-10-09 MED ORDER — DOXYCYCLINE HYCLATE 100 MG PO CAPS: 100.0000 mg | ORAL_CAPSULE | Freq: Two times a day (BID) | ORAL | 0 refills | Status: DC

## 2017-10-09 MED ORDER — CHLORHEXIDINE GLUCONATE 0.12% ORAL RINSE (MEDLINE KIT): 15.0000 mL | Freq: Two times a day (BID) | OROMUCOSAL | 0 refills | Status: DC

## 2017-10-09 MED ORDER — TRAMADOL HCL 50 MG PO TABS: 50.0000 mg | ORAL_TABLET | Freq: Four times a day (QID) | ORAL | 0 refills | Status: DC | PRN

## 2017-10-09 NOTE — ED Provider Notes (Signed)
[facility]ly [facility]    CSN: [id number] Arrival date & time: 10/09/17  8032     History   Chief Complaint Chief Complaint  Patient presents with  . Dental Pain    HPI Kimberly Griffith is a 62 y.o. female.   HPI  Kimberly Griffith is a 62 y.o. female presenting to UC with c/o Left upper tooth pain and gum swelling that started a few days ago. Associated HA. Pain is throbbing, 7/10.  She has tried ibuprofen with minimal relief. Pt knows she need to f/u with a dentist but she believes she needs an antibiotic today. Denies known fever. Denies n/v/d.    Past Medical History:  Diagnosis Date  . Allergy    kiwi  . Blood transfusion without reported diagnosis 1993   Dx'd with E.Coli  . Cancer ([place]) 2017   endometrial  . Chronic kidney disease   . Headache   . History of kidney stones 2008  . Pneumonia 2015  . Tremor    left thumb    Patient Active Problem List   Diagnosis Date Noted  . Endometrial cancer ([place]) 07/05/2015    Past Surgical History:  Procedure Laterality Date  . LYMPH NODE BIOPSY Bilateral 07/13/2015   Procedure: SENTINEL LYMPH NODE BIOPSY;  Surgeon: Janie Morning, MD;  [facility]: [facility];  Service: Gynecology;  Laterality: Bilateral;  . ROBOTIC ASSISTED TOTAL HYSTERECTOMY WITH BILATERAL SALPINGO OOPHERECTOMY Bilateral 07/13/2015   Procedure: XI ROBOTIC ASSISTED LAPAROSCOPIC TOTAL HYSTERECTOMY WITH BILATERAL SALPINGO OOPHORECTOMY;  Surgeon: Janie Morning, MD;  [facility]: [facility];  Service: Gynecology;  Laterality: Bilateral;  . TONSILLECTOMY AND ADENOIDECTOMY    . WISDOM TOOTH EXTRACTION      OB History    Gravida  7   Para  6   Term      Preterm      AB  1   Living  6     SAB  1   TAB      Ectopic      Multiple      Live Births               Home Medications    Prior to Admission medications   Medication Sig Start Date End Date Taking? Authorizing Provider  cephALEXin (KEFLEX) 500 MG capsule Take 1 capsule (500 mg total) by mouth 2  (two) times daily. 01/14/17   Kandra Nicolas, MD  chlorhexidine gluconate, MEDLINE KIT, (PERIDEX) 0.12 % solution Use as directed 15 mLs in the mouth or throat 2 (two) times daily. Swish gargle for 30-60 seconds then spit 10/09/17   Noe Gens, PA-C  Cranberry 180 MG CAPS Take by mouth.    [provider]  doxycycline (VIBRAMYCIN) 100 MG capsule Take 1 capsule (100 mg total) by mouth 2 (two) times daily. One po bid x 7 days 10/09/17   Noe Gens, PA-C  hydrochlorothiazide (HYDRODIURIL) 25 MG tablet Take 1 tablet (25 mg total) by mouth daily. Need office visit for any more refills. 07/04/16   Shawnee Knapp, MD  Lactase (LACTAID PO) Take 1 tablet by mouth daily as needed (For lactose intolerance.).    [provider]  Multiple Vitamins-Minerals (MULTIVITAMIN WITH MINERALS) tablet Take 1 tablet by mouth daily.    [provider]  nitrofurantoin, macrocrystal-monohydrate, (MACROBID) 100 MG capsule Take 1 capsule (100 mg total) by mouth daily. 11/26/15   Shawnee Knapp, MD  Probiotic Product (PROBIOTIC DAILY PO) Take 1 tablet by mouth daily  as needed (For digestive support.). Reported on 08/09/2015    [provider]  traMADol (ULTRAM) 50 MG tablet Take 1 tablet (50 mg total) by mouth every 6 (six) hours as needed. 10/09/17   Noe Gens, PA-C    Family History Family History  Problem Relation Age of Onset  . Hypertension Mother   . Hypertension Maternal Grandmother   . Stroke Maternal Grandmother   . Stomach cancer Maternal Grandmother   . Heart disease Paternal Grandfather   . Other Father        dec unknown cause age 75  . Hypertension Father   . Prostate cancer Father   . Colon cancer Neg Hx   . Rectal cancer Neg Hx   . Esophageal cancer Neg Hx   . Liver cancer Neg Hx     Social History Social History   Tobacco Use  . Smoking status: Former Smoker    Types: Cigarettes    Last attempt to quit: 07/04/1984    Years since quitting: 33.2  .  Smokeless tobacco: Former Systems developer    Quit date: 02/21/1984  Substance Use Topics  . Alcohol use: Not on file    Comment: one drink/month  . Drug use: Not on file     Allergies   Kiwi extract   Review of Systems Review of Systems  Constitutional: Negative for chills and fever.  HENT: Positive for dental problem. Negative for facial swelling.   Neurological: Positive for headaches. Negative for dizziness.     Physical Exam Triage Vital Signs ED Triage Vitals  Enc Vitals Group     BP      Pulse      Resp      Temp      Temp src      SpO2      Weight      Height      Head Circumference      Peak Flow      Pain Score      Pain Loc      Pain Edu?      Excl. in [place]?    No data found.  Updated Vital Signs BP (!) 148/83   Pulse 72   Temp 97.9 F (36.6 C) (Oral)   Ht _0  (1.6 m)   Wt 210 lb (95.3 kg)   LMP 02/21/2011 (Approximate)   SpO2 98%   BMI 37.20 kg/m   Visual Acuity Right Eye Distance:   Left Eye Distance:   Bilateral Distance:    Right Eye Near:   Left Eye Near:    Bilateral Near:     Physical Exam  Constitutional: She is oriented to person, place, and time. She appears well-developed and well-nourished. No distress.  HENT:  Head: Normocephalic and atraumatic.  Mouth/Throat: Uvula is midline, oropharynx is clear and moist and mucous membranes are normal. Abnormal dentition. Dental abscesses present.    Left upper gumline- multiple dental caries and dental decay. Erythema and edema of gingiva. Tenderness to touch. No bleeding or drainage.   Eyes: EOM are normal.  Neck: Normal range of motion.  Cardiovascular: Normal rate.  Pulmonary/Chest: Effort normal.  Musculoskeletal: Normal range of motion.  Neurological: She is alert and oriented to person, place, and time.  Skin: Skin is warm and dry. She is not diaphoretic.  Psychiatric: She has a normal mood and affect. Her behavior is normal.  Nursing note and vitals reviewed.    UC Treatments /  Results  Labs (all labs ordered are listed, but only abnormal results are displayed) Labs Reviewed - No data to display  EKG None  Radiology No results found.  Procedures Procedures (including critical care time)  Medications Ordered in UC Medications - No data to display  Initial Impression / Assessment and Plan / UC Course  I have reviewed the triage vital signs and the nursing notes.  Pertinent labs & imaging results that were available during my care of the patient were reviewed by me and considered in my medical decision making (see chart for details).     Will start pt on antibiotics and small amount of pain medication to help her until she can establish with a dentist.  Resource guide provided.  Final Clinical Impressions(s) / UC Diagnoses   Final diagnoses:  Dental abscess  Dental decay     Discharge Instructions      Patients with Medicaid: [facility], [place] [address], [phone]  If unable to pay, or uninsured, contact [facility] [phone]) or [place] [phone] in [place], [place] in [facility]) to become qualified for the adult dental clinic  Other [facility] [place], [place], [place], [address]    [phone], Ext. [phone]    2nd and 4th Thursday of the month at 6:30am    10 clients each day by appointment, can sometimes see walk-in patients if someone does not show for an appointment [facility]- [address] [place]er [place], [place], [address]    [phone] [facility]- [address], [place], [place], [address]    [phone]  [facility]- [phone] [facility] [phone]     ED Prescriptions    Medication Sig Dispense Auth. Provider   doxycycline (VIBRAMYCIN) 100 MG capsule Take 1 capsule (100 mg  total) by mouth 2 (two) times daily. One po bid x 7 days 14 capsule Teryl Gubler O, PA-C   traMADol (ULTRAM) 50 MG tablet Take 1 tablet (50 mg total) by mouth every 6 (six) hours as needed. 15 tablet Leeroy Cha O, PA-C   chlorhexidine gluconate, MEDLINE KIT, (PERIDEX) 0.12 % solution Use as directed 15 mLs in the mouth or throat 2 (two) times daily. Swish gargle for 30-60 seconds then spit 120 mL Noe Gens, PA-C     Controlled Substance Prescriptions [place] Controlled Substance Registry consulted? Yes, I have consulted the [place] Controlled Substances Registry for this patient, and feel the risk/benefit ratio today is favorable for proceeding with this prescription for a controlled substance.   Noe Gens, [place] 10/12/17 [phone]

## 2017-10-09 NOTE — Discharge Instructions (Signed)
°  Patients with Medicaid: [facility] °[address], [phone] °[address], [phone] ° °If unable to pay, or uninsured, contact [facility] ([phone]) or [facility] ([phone] in [place], [phone] in [place]) to become qualified for the adult dental clinic ° °Other [facility]Cost [facility]: °[facility]- [address], [place], [place], [address] °   [phone], Ext. [phone] °   2nd and 4th Thursday of the month at 6:30am °   10 clients each day by appointment, can sometimes see walk-in patients if someone does not show for an appointment °[facility]- [address], [place], [place], [address] °   [phone] °[facility]- [address], [place], , [address] °   [phone] ° °[facility]- [phone] °[facility]- [phone] °[facility]- [phone] ° °

## 2017-10-12 NOTE — ED Triage Notes (Signed)
Notes, vitals, meds not entered on date of service 10/09/17. Filling in information form intake form.   Patent c/o 2-3 days of left sided dental pain. Taken ibuprofen.

## 2019-11-24 ENCOUNTER — Emergency Department (HOSPITAL_BASED_OUTPATIENT_CLINIC_OR_DEPARTMENT_OTHER): Payer: Managed Care, Other (non HMO)

## 2019-11-24 ENCOUNTER — Other Ambulatory Visit: Payer: Self-pay

## 2019-11-24 ENCOUNTER — Emergency Department (HOSPITAL_BASED_OUTPATIENT_CLINIC_OR_DEPARTMENT_OTHER)
Admission: EM | Admit: 2019-11-24 | Discharge: 2019-11-24 | Disposition: A | Discharge status: Home / Self Care | Payer: Managed Care, Other (non HMO) | Attending: Emergency Medicine | Admitting: Emergency Medicine

## 2019-11-24 ENCOUNTER — Encounter (HOSPITAL_BASED_OUTPATIENT_CLINIC_OR_DEPARTMENT_OTHER): Payer: Self-pay | Admitting: *Deleted

## 2019-11-24 DIAGNOSIS — Y9301 Activity, walking, marching and hiking: Secondary | ICD-10-CM | POA: Diagnosis not present

## 2019-11-24 DIAGNOSIS — N189 Chronic kidney disease, unspecified: Secondary | ICD-10-CM | POA: Insufficient documentation

## 2019-11-24 DIAGNOSIS — Y9289 Other specified places as the place of occurrence of the external cause: Secondary | ICD-10-CM | POA: Diagnosis not present

## 2019-11-24 DIAGNOSIS — Z8542 Personal history of malignant neoplasm of other parts of uterus: Secondary | ICD-10-CM | POA: Diagnosis not present

## 2019-11-24 DIAGNOSIS — W108XXA Fall (on) (from) other stairs and steps, initial encounter: Secondary | ICD-10-CM | POA: Insufficient documentation

## 2019-11-24 DIAGNOSIS — S93602A Unspecified sprain of left foot, initial encounter: Secondary | ICD-10-CM | POA: Diagnosis not present

## 2019-11-24 DIAGNOSIS — S99922A Unspecified injury of left foot, initial encounter: Secondary | ICD-10-CM | POA: Diagnosis present

## 2019-11-24 DIAGNOSIS — Z87891 Personal history of nicotine dependence: Secondary | ICD-10-CM | POA: Diagnosis not present

## 2019-11-24 MED ORDER — IBUPROFEN 200 MG PO TABS
600.0000 mg | ORAL_TABLET | Freq: Once | ORAL | Status: AC
  Administered 2019-11-24: 23:00:00 600 mg via ORAL
  Filled 2019-11-24: qty 1

## 2019-11-24 MED ORDER — ACETAMINOPHEN 500 MG PO TABS
1000.0000 mg | ORAL_TABLET | Freq: Once | ORAL | Status: AC
  Administered 2019-11-24: 23:00:00 1000 mg via ORAL
  Filled 2019-11-24: qty 2

## 2019-11-24 NOTE — Discharge Instructions (Signed)
Your x-ray does not show any evidence of fracture, I suspect that you sprained your foot and may have some ligamentous injury.  Use Motrin and Tylenol, ice and elevate the foot and use Ace wrap and crutches.  If pain is not improving over the next week please follow-up with Dr. Raeford Razor with sports medicine

## 2019-11-24 NOTE — ED Triage Notes (Signed)
She slipped down 2 steps this am. Injury to her left foot.

## 2019-11-24 NOTE — ED Provider Notes (Signed)
[facility] EMERGENCY DEPARTMENT Provider Note   CSN: [id number] Arrival date & time: 11/24/19  1729     History Chief Complaint  Patient presents with  . Foot Injury    Aidaly Cordner is a 64 y.o. female.  Jhordan Kinter is a 64 y.o. female with a history of headaches, CKD, allergies, who presents to the ED for evaluation of left foot injury.  Patient states that early this morning she was walking down the steps when she slipped down 2 steps and states that her left foot got bent backwards.  She reports since then she has had worsening pain and swelling over the left foot and noticed some bruising over the fourth and fifth toes.  She reports pain is worse with walking and she cannot stand to bear weight on the foot.  She denies pain at the ankle or heel.  No numbness or weakness.  No wounds or abrasions.  She has not taken any medication for foot pain prior to arrival.        Past Medical History:  Diagnosis Date  . Allergy    kiwi  . Blood transfusion without reported diagnosis 1993   Dx'd with E.Coli  . Cancer (HCC) 2017   endometrial  . Chronic kidney disease   . Headache   . History of kidney stones 2008  . Pneumonia 2015  . Tremor    left thumb    Patient Active Problem List   Diagnosis Date Noted  . Endometrial cancer (HCC) 07/05/2015    Past Surgical History:  Procedure Laterality Date  . LYMPH NODE BIOPSY Bilateral 07/13/2015   Procedure: SENTINEL LYMPH NODE BIOPSY;  Surgeon: Laurette Schimke, MD;  Location: [facility];  Service: Gynecology;  Laterality: Bilateral;  . ROBOTIC ASSISTED TOTAL HYSTERECTOMY WITH BILATERAL SALPINGO OOPHERECTOMY Bilateral 07/13/2015   Procedure: XI ROBOTIC ASSISTED LAPAROSCOPIC TOTAL HYSTERECTOMY WITH BILATERAL SALPINGO OOPHORECTOMY;  Surgeon: Laurette Schimke, MD;  Location: [facility];  Service: Gynecology;  Laterality: Bilateral;  . TONSILLECTOMY AND ADENOIDECTOMY    . WISDOM TOOTH EXTRACTION       OB History    Gravida  7   Para    6   Term      Preterm      AB  1   Living  6     SAB  1   TAB      Ectopic      Multiple      Live Births              Family History  Problem Relation Age of Onset  . Hypertension Mother   . Hypertension Maternal Grandmother   . Stroke Maternal Grandmother   . Stomach cancer Maternal Grandmother   . Heart disease Paternal Grandfather   . Other Father        dec unknown cause age 37  . Hypertension Father   . Prostate cancer Father   . Colon cancer Neg Hx   . Rectal cancer Neg Hx   . Esophageal cancer Neg Hx   . Liver cancer Neg Hx     Social History   Tobacco Use  . Smoking status: Former Smoker    Types: Cigarettes    Quit date: 07/04/1984    Years since quitting: 35.4  . Smokeless tobacco: Former Neurosurgeon    Quit date: 02/21/1984  Substance Use Topics  . Alcohol use: Not on file    Comment: one drink/month  . Drug use: Not on file  Home Medications Prior to Admission medications   Medication Sig Start Date End Date Taking? Authorizing Provider  chlorhexidine gluconate, MEDLINE KIT, (PERIDEX) 0.12 % solution Use as directed 15 mLs in the mouth or throat 2 (two) times daily. Swish gargle for 30-60 seconds then spit 10/09/17   Noe Gens, PA-C  doxycycline (VIBRAMYCIN) 100 MG capsule Take 1 capsule (100 mg total) by mouth 2 (two) times daily. One po bid x 7 days 10/09/17   Noe Gens, PA-C  traMADol (ULTRAM) 50 MG tablet Take 1 tablet (50 mg total) by mouth every 6 (six) hours as needed. 10/09/17   Noe Gens, PA-C    Allergies    Kiwi extract  Review of Systems   Review of Systems  Constitutional: Negative for chills and fever.  Musculoskeletal: Positive for arthralgias and joint swelling.  Skin: Positive for color change. Negative for rash and wound.  Neurological: Negative for weakness and numbness.    Physical Exam Updated Vital Signs BP (!) 157/80   Pulse 72   Temp 98.2 F (36.8 C) (Oral)   Resp 20   Ht $R'5\' 3"'ku$  (1.6 m)   Wt  95.3 kg   LMP 02/21/2011 (Approximate)   SpO2 99%   BMI 37.20 kg/m   Physical Exam Vitals and nursing note reviewed.  Constitutional:      General: She is not in acute distress.    Appearance: She is well-developed. She is not diaphoretic.  HENT:     Head: Normocephalic and atraumatic.  Eyes:     General:        Right eye: No discharge.        Left eye: No discharge.  Pulmonary:     Effort: Pulmonary effort is normal. No respiratory distress.  Musculoskeletal:        General: Tenderness present.     Comments: Tenderness and swelling over the left foot, primarily over the distal forefoot with some bruising noted over the fourth and fifth toes, no obvious bony deformity, no bruising to the bottom of the foot to suggest Lisfranc injury.  Patient is able to wiggle all toes and has 2+ DP and PT pulses and good cap refill.  No tenderness or pain over the ankle, with normal range of motion.  Normal sensation and strength.  Skin:    General: Skin is warm and dry.  Neurological:     Mental Status: She is alert and oriented to person, place, and time.     Coordination: Coordination normal.  Psychiatric:        Mood and Affect: Mood normal.        Behavior: Behavior normal.     ED Results / Procedures / Treatments   Labs (all labs ordered are listed, but only abnormal results are displayed) Labs Reviewed - No data to display  EKG None  Radiology DG Foot Complete Left  Result Date: 11/24/2019 CLINICAL DATA:  [place] today with lateral foot pain. EXAM: LEFT FOOT - COMPLETE 3+ VIEW COMPARISON:  None. FINDINGS: Nonspecific soft tissue swelling. No evidence of fracture or dislocation. IMPRESSION: Nonspecific soft tissue swelling. Electronically Signed   By: Nelson Chimes M.D.   On: 11/24/2019 18:41    Procedures Procedures (including critical care time)  Medications Ordered in ED Medications  ibuprofen (ADVIL) tablet 600 mg (600 mg Oral Given 11/24/19 2254)  acetaminophen (TYLENOL)  tablet 1,000 mg (1,000 mg Oral Given 11/24/19 2253)    ED Course  I have reviewed the triage  vital signs and the nursing notes.  Pertinent labs & imaging results that were available during my care of the patient were reviewed by me and considered in my medical decision making (see chart for details).    MDM Rules/Calculators/A&P                         64 year old female presents after injury to the left foot when she slipped going down 2 stairs.  The foot has some tenderness and swelling primarily over the distal forefoot, there is no tenderness or pain over the ankle.  Some contusion over the fourth and fifth toes, but no piano key sign on the bottom of the foot.  Foot is neurovascularly intact.  X-ray without evidence of fracture, suspect foot sprain.  Will treat with Ace wrap, crutches, ice, elevation, NSAIDs and Tylenol.  Sports medicine follow-up if pain is not improving.  Return precautions discussed.  Patient expresses understanding and agreement.  Discharged home in good condition.  Final Clinical Impression(s) / ED Diagnoses Final diagnoses:  Sprain of left foot, initial encounter    Rx / DC Orders ED Discharge Orders    None       Jacqlyn Larsen, [place] 11/27/19 0247    Wyvonnia Dusky, MD 11/29/19 0930

## 2019-11-28 ENCOUNTER — Ambulatory Visit (INDEPENDENT_AMBULATORY_CARE_PROVIDER_SITE_OTHER): Payer: Managed Care, Other (non HMO) | Admitting: Family Medicine

## 2019-11-28 ENCOUNTER — Encounter: Payer: Self-pay | Admitting: Family Medicine

## 2019-11-28 ENCOUNTER — Other Ambulatory Visit: Payer: Self-pay

## 2019-11-28 ENCOUNTER — Ambulatory Visit: Payer: Self-pay

## 2019-11-28 VITALS — BP 141/80 | HR 70 | Ht 64.0 in

## 2019-11-28 DIAGNOSIS — S93602A Unspecified sprain of left foot, initial encounter: Secondary | ICD-10-CM | POA: Insufficient documentation

## 2019-11-28 DIAGNOSIS — M79672 Pain in left foot: Secondary | ICD-10-CM

## 2019-11-28 DIAGNOSIS — S93602D Unspecified sprain of left foot, subsequent encounter: Secondary | ICD-10-CM | POA: Insufficient documentation

## 2019-11-28 NOTE — Patient Instructions (Signed)
Nice to meet you Happy early Rudene Anda!  Please try ice  Please try the boot  Please try to elevate the foot  You can use a crutch on the left side Please try ibuprofen   Please send me a message in MyChart with any questions or updates.  Please see me back in 2-3 weeks.   --Dr. Raeford Razor

## 2019-11-28 NOTE — Assessment & Plan Note (Signed)
Injury occurred on 10/4.  Had a severe plantarflexion injury.  Does have suggestions of a sprain but no structural deformity. -Counseled on supportive care. -Counseled on ibuprofen. -Counseled on partial weightbearing. -Could consider physical therapy or further imaging if needed.

## 2019-11-28 NOTE — Progress Notes (Signed)
Kimberly Griffith - 64 y.o. female MRN [id number]  Date of birth: 1955/10/27  SUBJECTIVE:  Including CC & ROS.  Chief Complaint  Patient presents with  . Foot Injury    left    Kimberly Griffith is a 64 y.o. female that is presenting with left foot pain.  She had an injury on Monday.  Her foot was in a severely plantarflexed position and behind her.  Since that time she has had significant dorsal midfoot swelling as well as forefoot swelling and pain.  No history surgery..  Independent review of the left foot x-ray from 10/4 shows nonspecific dorsal foot swelling.   Review of Systems See HPI   HISTORY: Past Medical, Surgical, Social, and Family History Reviewed & Updated per EMR.   Pertinent Historical Findings include:  Past Medical History:  Diagnosis Date  . Allergy    kiwi  . Blood transfusion without reported diagnosis 1993   Dx'd with E.Coli  . Cancer ([place]) 2017   endometrial  . Chronic kidney disease   . Headache   . History of kidney stones 2008  . Pneumonia 2015  . Tremor    left thumb    Past Surgical History:  Procedure Laterality Date  . LYMPH NODE BIOPSY Bilateral 07/13/2015   Procedure: SENTINEL LYMPH NODE BIOPSY;  Surgeon: Janie Morning, MD;  Location: [facility];  Service: Gynecology;  Laterality: Bilateral;  . ROBOTIC ASSISTED TOTAL HYSTERECTOMY WITH BILATERAL SALPINGO OOPHERECTOMY Bilateral 07/13/2015   Procedure: XI ROBOTIC ASSISTED LAPAROSCOPIC TOTAL HYSTERECTOMY WITH BILATERAL SALPINGO OOPHORECTOMY;  Surgeon: Janie Morning, MD;  Location: [facility];  Service: Gynecology;  Laterality: Bilateral;  . TONSILLECTOMY AND ADENOIDECTOMY    . WISDOM TOOTH EXTRACTION      Family History  Problem Relation Age of Onset  . Hypertension Mother   . Hypertension Maternal Grandmother   . Stroke Maternal Grandmother   . Stomach cancer Maternal Grandmother   . Heart disease Paternal Grandfather   . Other Father        dec unknown cause age 82  . Hypertension Father   .  Prostate cancer Father   . Colon cancer Neg Hx   . Rectal cancer Neg Hx   . Esophageal cancer Neg Hx   . Liver cancer Neg Hx     Social History   Socioeconomic History  . Marital status: Married    Spouse name: Not on file  . Number of children: 6  . Years of education: Not on file  . Highest education level: Not on file  Occupational History  . Occupation: Government social research officer  Tobacco Use  . Smoking status: Former Smoker    Types: Cigarettes    Quit date: 07/04/1984    Years since quitting: 35.4  . Smokeless tobacco: Former Systems developer    Quit date: 02/21/1984  Substance and Sexual Activity  . Alcohol use: Not on file    Comment: one drink/month  . Drug use: Not on file  . Sexual activity: Yes    Partners: Male    Birth control/protection: Post-menopausal, Surgical  Other Topics Concern  . Not on file  Social History Narrative  . Not on file   Social Determinants of Health   Financial Resource Strain:   . Difficulty of Paying Living Expenses: Not on file  Food Insecurity:   . Worried About Charity fundraiser in the Last Year: Not on file  . Ran Out of Food in the Last Year: Not on file  Transportation Needs:   .  Lack of Transportation (Medical): Not on file  . Lack of Transportation (Non-Medical): Not on file  Physical Activity:   . Days of Exercise per Week: Not on file  . Minutes of Exercise per Session: Not on file  Stress:   . Feeling of Stress : Not on file  Social Connections:   . Frequency of Communication with Friends and Family: Not on file  . Frequency of Social Gatherings with Friends and Family: Not on file  . Attends Religious Services: Not on file  . Active Member of Clubs or Organizations: Not on file  . Attends Archivist Meetings: Not on file  . Marital Status: Not on file  Intimate Partner Violence:   . Fear of Current or Ex-Partner: Not on file  . Emotionally Abused: Not on file  . Physically Abused: Not on file  . Sexually Abused: Not on  file     PHYSICAL EXAM:  VS: BP (!) 141/80   Pulse 70   Ht 5\' 4"  (1.626 m)   LMP 02/21/2011 (Approximate)   BMI 36.05 kg/m  Physical Exam Gen: NAD, alert, cooperative with exam, well-appearing MSK:  Left foot: Tenderness palpation of the Lisfranc joint as well as metatarsal shafts. Dorsal swelling and ecchymosis. Normal ankle range of motion. Pain with weightbearing. Neurovascularly intact  Limited ultrasound: Left foot:  No changes appreciated the first MTP joint. No change of the Lisfranc ligament. Hyperemia appreciated the midfoot. Hematoma appreciated in the subcutaneous of the midfoot. No change of the metatarsal shaft.  Summary: Soft tissue swelling is hematoma appreciated  Ultrasound and interpretation by Clearance Coots, MD    ASSESSMENT & PLAN:   Foot sprain, left, initial encounter Injury occurred on 10/4.  Had a severe plantarflexion injury.  Does have suggestions of a sprain but no structural deformity. -Counseled on supportive care. -Counseled on ibuprofen. -Counseled on partial weightbearing. -Could consider physical therapy or further imaging if needed.

## 2019-12-17 ENCOUNTER — Ambulatory Visit (INDEPENDENT_AMBULATORY_CARE_PROVIDER_SITE_OTHER): Payer: Managed Care, Other (non HMO) | Admitting: Family Medicine

## 2019-12-17 ENCOUNTER — Encounter: Payer: Self-pay | Admitting: Family Medicine

## 2019-12-17 ENCOUNTER — Other Ambulatory Visit: Payer: Self-pay

## 2019-12-17 DIAGNOSIS — S93602D Unspecified sprain of left foot, subsequent encounter: Secondary | ICD-10-CM

## 2019-12-17 NOTE — Progress Notes (Signed)
Kimberly Griffith - 64 y.o. female MRN [id number]  Date of birth: July 23, 1955  SUBJECTIVE:  Including CC & ROS.  No chief complaint on file.   Kimberly Griffith is a 64 y.o. female that is following up for her left foot pain.  Her foot pain is significantly improved and is no longer using the cam walker.  She is having some ankle pain..    Review of Systems See HPI   HISTORY: Past Medical, Surgical, Social, and Family History Reviewed & Updated per EMR.   Pertinent Historical Findings include:  Past Medical History:  Diagnosis Date  . Allergy    kiwi  . Blood transfusion without reported diagnosis 1993   Dx'd with E.Coli  . Cancer ([place]) 2017   endometrial  . Chronic kidney disease   . Headache   . History of kidney stones 2008  . Pneumonia 2015  . Tremor    left thumb    Past Surgical History:  Procedure Laterality Date  . LYMPH NODE BIOPSY Bilateral 07/13/2015   Procedure: SENTINEL LYMPH NODE BIOPSY;  Surgeon: Janie Morning, MD;  Location: [facility];  Service: Gynecology;  Laterality: Bilateral;  . ROBOTIC ASSISTED TOTAL HYSTERECTOMY WITH BILATERAL SALPINGO OOPHERECTOMY Bilateral 07/13/2015   Procedure: XI ROBOTIC ASSISTED LAPAROSCOPIC TOTAL HYSTERECTOMY WITH BILATERAL SALPINGO OOPHORECTOMY;  Surgeon: Janie Morning, MD;  Location: [facility];  Service: Gynecology;  Laterality: Bilateral;  . TONSILLECTOMY AND ADENOIDECTOMY    . WISDOM TOOTH EXTRACTION      Family History  Problem Relation Age of Onset  . Hypertension Mother   . Hypertension Maternal Grandmother   . Stroke Maternal Grandmother   . Stomach cancer Maternal Grandmother   . Heart disease Paternal Grandfather   . Other Father        dec unknown cause age 55  . Hypertension Father   . Prostate cancer Father   . Colon cancer Neg Hx   . Rectal cancer Neg Hx   . Esophageal cancer Neg Hx   . Liver cancer Neg Hx     Social History   Socioeconomic History  . Marital status: Married    Spouse name: Not on file  .  Number of children: 6  . Years of education: Not on file  . Highest education level: Not on file  Occupational History  . Occupation: Government social research officer  Tobacco Use  . Smoking status: Former Smoker    Types: Cigarettes    Quit date: 07/04/1984    Years since quitting: 35.4  . Smokeless tobacco: Former Systems developer    Quit date: 02/21/1984  Substance and Sexual Activity  . Alcohol use: Not on file    Comment: one drink/month  . Drug use: Not on file  . Sexual activity: Yes    Partners: Male    Birth control/protection: Post-menopausal, Surgical  Other Topics Concern  . Not on file  Social History Narrative  . Not on file   Social Determinants of Health   Financial Resource Strain:   . Difficulty of Paying Living Expenses: Not on file  Food Insecurity:   . Worried About Charity fundraiser in the Last Year: Not on file  . Ran Out of Food in the Last Year: Not on file  Transportation Needs:   . Lack of Transportation (Medical): Not on file  . Lack of Transportation (Non-Medical): Not on file  Physical Activity:   . Days of Exercise per Week: Not on file  . Minutes of Exercise per Session: Not on  file  Stress:   . Feeling of Stress : Not on file  Social Connections:   . Frequency of Communication with Friends and Family: Not on file  . Frequency of Social Gatherings with Friends and Family: Not on file  . Attends Religious Services: Not on file  . Active Member of Clubs or Organizations: Not on file  . Attends Archivist Meetings: Not on file  . Marital Status: Not on file  Intimate Partner Violence:   . Fear of Current or Ex-Partner: Not on file  . Emotionally Abused: Not on file  . Physically Abused: Not on file  . Sexually Abused: Not on file     PHYSICAL EXAM:  VS: LMP 02/21/2011 (Approximate)  Physical Exam Gen: NAD, alert, cooperative with exam, well-appearing MSK:  Left foot: No tenderness palpation of the dorsum of the foot. Some swelling over the dorsum  still present. Excessive venous changes over the foot. Neurovascularly intact     ASSESSMENT & PLAN:   Foot sprain, left, initial encounter Initial injury on 10/4.  Has had improvement of the pain has been able to come out of the cam walker. -Counseled on home exercise therapy and supportive care. -Could consider physical therapy or further imaging if needed.

## 2019-12-17 NOTE — Patient Instructions (Signed)
Good to see you Please try heat on the foot   Please send me a message in MyChart with any questions or updates.  Please see me back in 4 weeks.   --Dr. Raeford Razor

## 2019-12-17 NOTE — Assessment & Plan Note (Signed)
Initial injury on 10/4.  Has had improvement of the pain has been able to come out of the cam walker. -Counseled on home exercise therapy and supportive care. -Could consider physical therapy or further imaging if needed.

## 2020-01-20 ENCOUNTER — Ambulatory Visit: Payer: Managed Care, Other (non HMO) | Admitting: Family Medicine

## 2022-06-05 ENCOUNTER — Encounter: Payer: Self-pay | Admitting: *Deleted

## 2022-07-26 IMAGING — DX DG FOOT COMPLETE 3+V*L*
3 series · 3 of 3 positions shown · non-contrast
Comparison: None.

CLINICAL DATA: Fell today with lateral foot pain.

EXAM:
LEFT FOOT - COMPLETE 3+ VIEW

[foot ap]
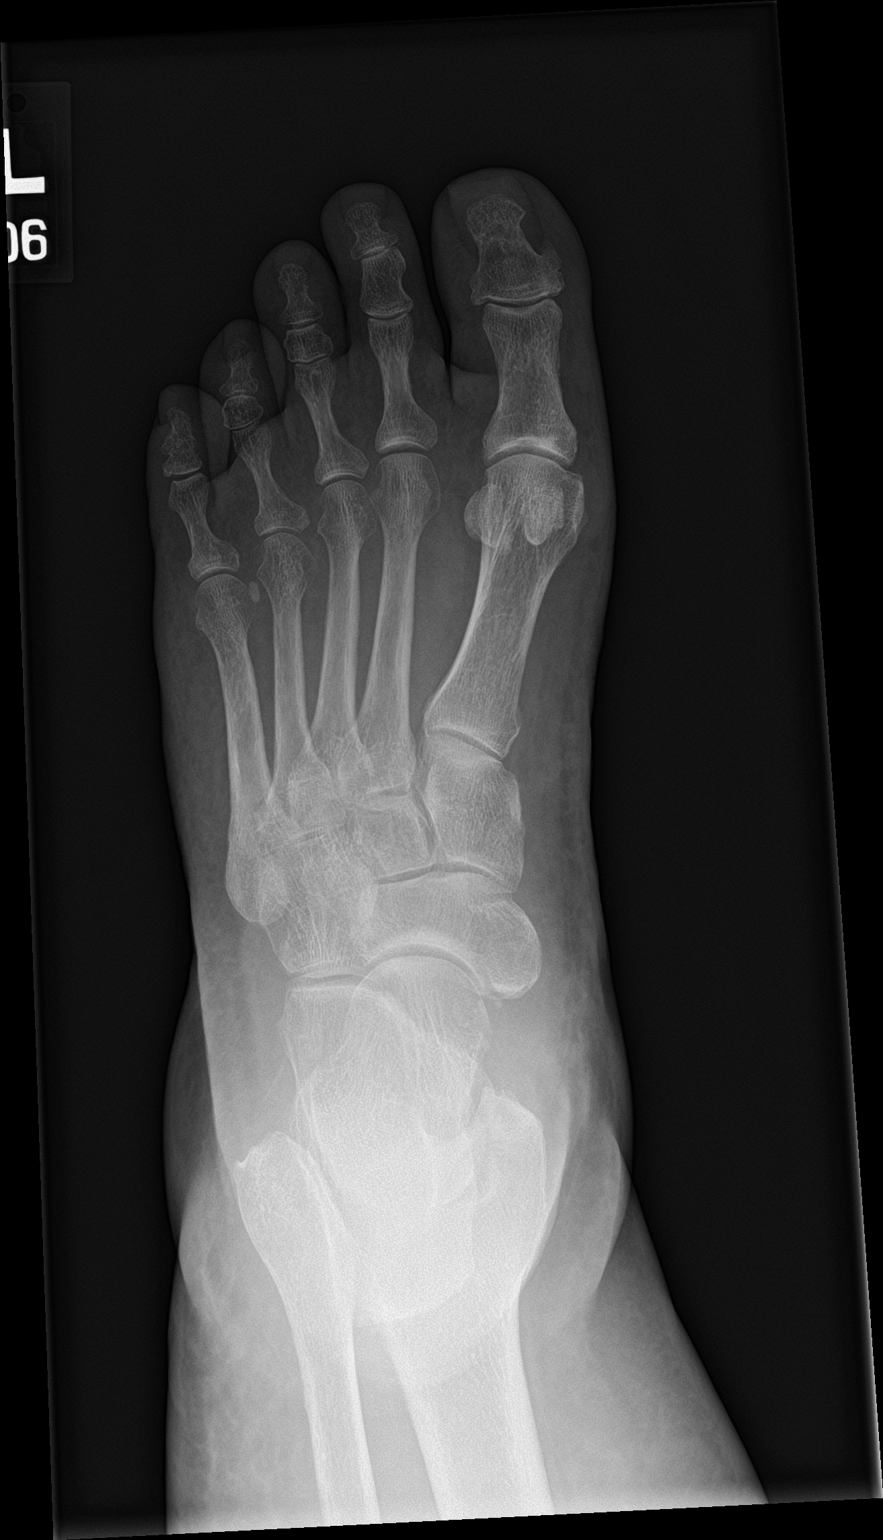

[foot obl]
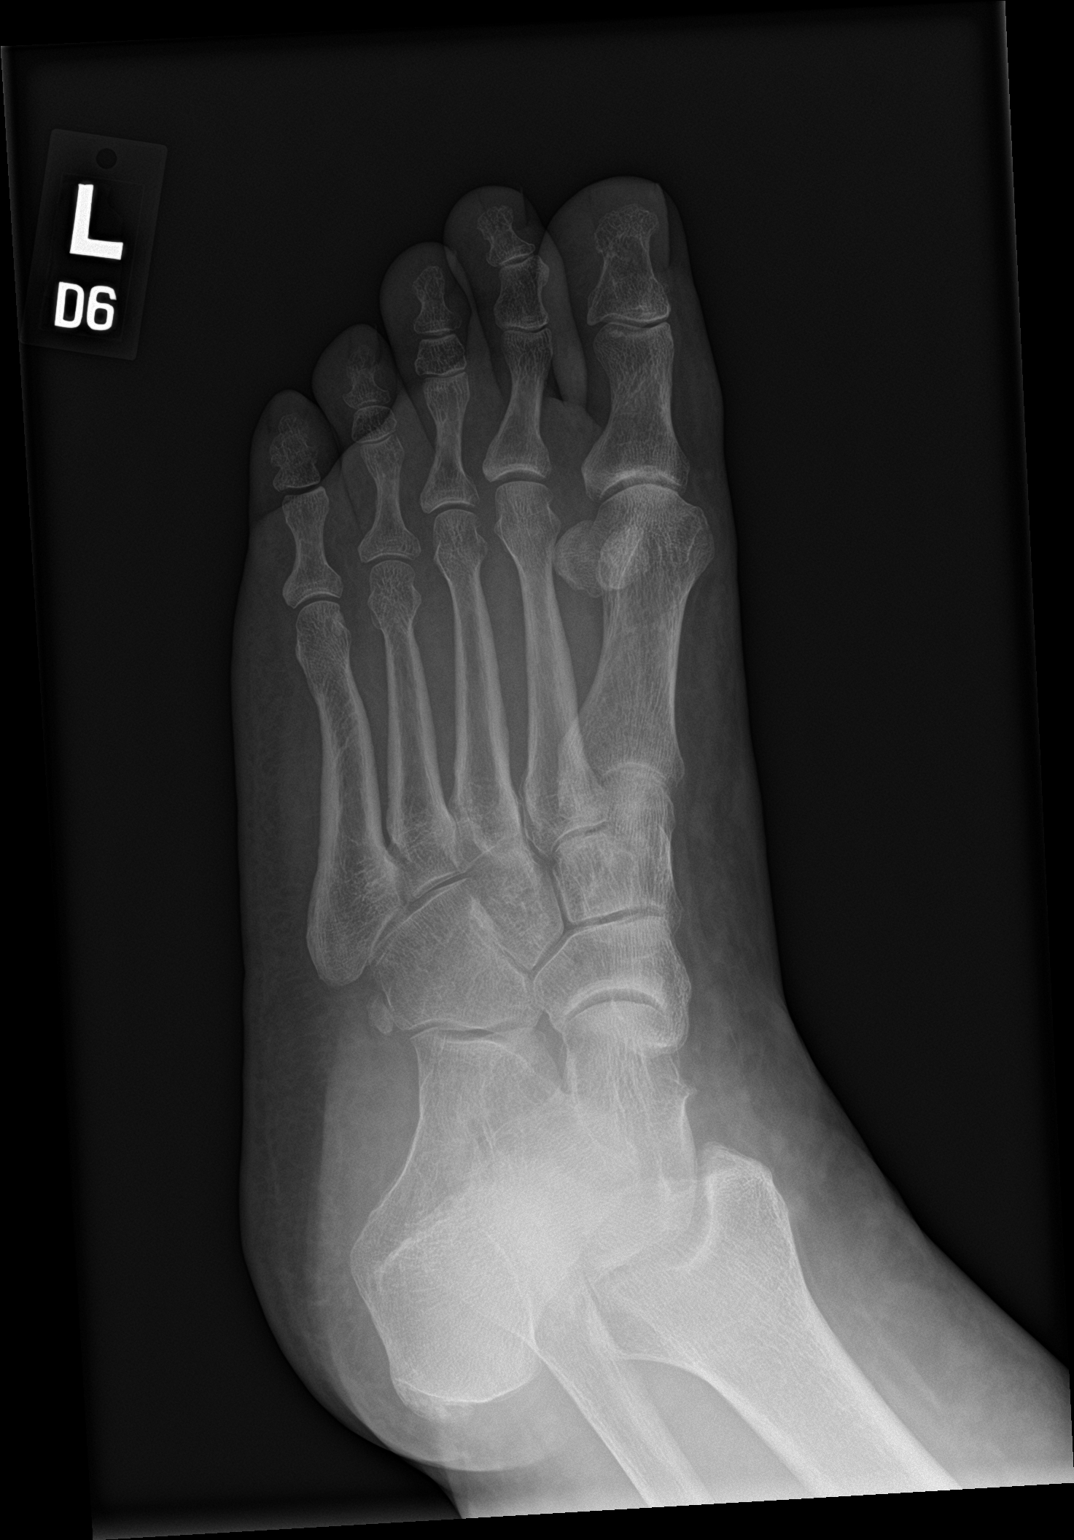

[foot lat]
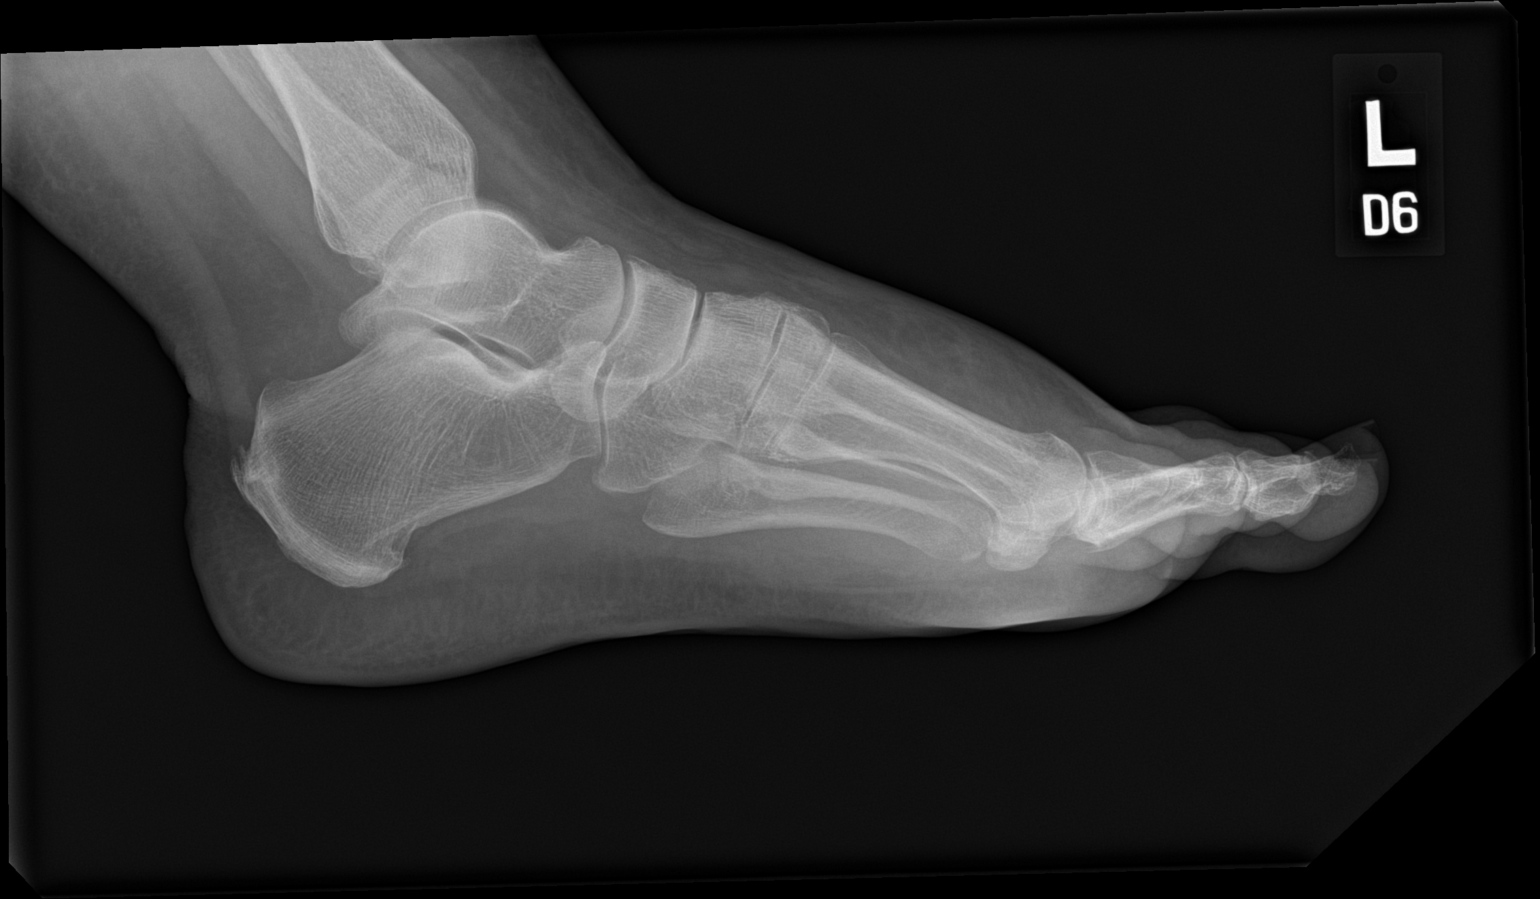

[3 of 3 positions shown; findings below may reference images not displayed]

FINDINGS: Nonspecific soft tissue swelling. No evidence of fracture or
dislocation.
IMPRESSION: Nonspecific soft tissue swelling.

## 2022-11-12 ENCOUNTER — Ambulatory Visit
Admission: EM | Admit: 2022-11-12 | Discharge: 2022-11-12 | Disposition: A | Discharge status: Home / Self Care | Payer: Managed Care, Other (non HMO)

## 2022-11-12 ENCOUNTER — Encounter: Payer: Self-pay | Admitting: Emergency Medicine

## 2022-11-12 ENCOUNTER — Emergency Department (HOSPITAL_BASED_OUTPATIENT_CLINIC_OR_DEPARTMENT_OTHER)
Admission: EM | Admit: 2022-11-12 | Discharge: 2022-11-12 | Disposition: A | Discharge status: Home / Self Care | Payer: Managed Care, Other (non HMO) | Attending: Emergency Medicine | Admitting: Emergency Medicine

## 2022-11-12 ENCOUNTER — Other Ambulatory Visit: Payer: Self-pay

## 2022-11-12 ENCOUNTER — Emergency Department (HOSPITAL_BASED_OUTPATIENT_CLINIC_OR_DEPARTMENT_OTHER): Payer: Managed Care, Other (non HMO)

## 2022-11-12 ENCOUNTER — Encounter (HOSPITAL_BASED_OUTPATIENT_CLINIC_OR_DEPARTMENT_OTHER): Payer: Self-pay | Admitting: Emergency Medicine

## 2022-11-12 DIAGNOSIS — N189 Chronic kidney disease, unspecified: Secondary | ICD-10-CM | POA: Insufficient documentation

## 2022-11-12 DIAGNOSIS — Z87891 Personal history of nicotine dependence: Secondary | ICD-10-CM | POA: Insufficient documentation

## 2022-11-12 DIAGNOSIS — M79662 Pain in left lower leg: Secondary | ICD-10-CM

## 2022-11-12 DIAGNOSIS — Z8542 Personal history of malignant neoplasm of other parts of uterus: Secondary | ICD-10-CM | POA: Insufficient documentation

## 2022-11-12 DIAGNOSIS — M7989 Other specified soft tissue disorders: Secondary | ICD-10-CM | POA: Diagnosis not present

## 2022-11-12 DIAGNOSIS — M79605 Pain in left leg: Secondary | ICD-10-CM

## 2022-11-12 DIAGNOSIS — I824Y2 Acute embolism and thrombosis of unspecified deep veins of left proximal lower extremity: Secondary | ICD-10-CM | POA: Diagnosis not present

## 2022-11-12 DIAGNOSIS — Z87442 Personal history of urinary calculi: Secondary | ICD-10-CM | POA: Diagnosis not present

## 2022-11-12 LAB — CBC WITH DIFFERENTIAL/PLATELET
Abs Immature Granulocytes: 0.07 10*3/uL (ref 0.00–0.07)
Basophils Absolute: 0.1 10*3/uL (ref 0.0–0.1)
Basophils Relative: 1 %
Eosinophils Absolute: 0.2 10*3/uL (ref 0.0–0.5)
Eosinophils Relative: 3 %
HCT: 40.2 % (ref 36.0–46.0)
Hemoglobin: 13.3 g/dL (ref 12.0–15.0)
Immature Granulocytes: 1 %
Lymphocytes Relative: 31 %
Lymphs Abs: 2 10*3/uL (ref 0.7–4.0)
MCH: 29.8 pg (ref 26.0–34.0)
MCHC: 33.1 g/dL (ref 30.0–36.0)
MCV: 89.9 fL (ref 80.0–100.0)
Monocytes Absolute: 0.6 10*3/uL (ref 0.1–1.0)
Monocytes Relative: 9 %
Neutro Abs: 3.6 10*3/uL (ref 1.7–7.7)
Neutrophils Relative %: 55 %
Platelets: 244 10*3/uL (ref 150–400)
RBC: 4.47 MIL/uL (ref 3.87–5.11)
RDW: 13.5 % (ref 11.5–15.5)
WBC: 6.5 10*3/uL (ref 4.0–10.5)
nRBC: 0 % (ref 0.0–0.2)

## 2022-11-12 LAB — BASIC METABOLIC PANEL
Anion gap: 10 (ref 5–15)
BUN: 10 mg/dL (ref 8–23)
CO2: 24 mmol/L (ref 22–32)
Calcium: 8.8 mg/dL — ABNORMAL LOW (ref 8.9–10.3)
Chloride: 101 mmol/L (ref 98–111)
Creatinine, Ser: 0.5 mg/dL (ref 0.44–1.00)
GFR, Estimated: >60 mL/min (ref >60)
Glucose, Bld: 95 mg/dL (ref 70–99)
Potassium: 3.8 mmol/L (ref 3.5–5.1)
Sodium: 135 mmol/L (ref 135–145)

## 2022-11-12 MED ORDER — APIXABAN 2.5 MG PO TABS
10.0000 mg | ORAL_TABLET | Freq: Once | ORAL | Status: AC
  Administered 2022-11-12: 10 mg via ORAL
  Filled 2022-11-12: qty 4

## 2022-11-12 MED ORDER — CEPHALEXIN 500 MG PO CAPS: 500.0000 mg | ORAL_CAPSULE | Freq: Two times a day (BID) | ORAL | 0 refills | Status: AC

## 2022-11-12 MED ORDER — APIXABAN (ELIQUIS) EDUCATION KIT FOR DVT/PE PATIENTS: PACK | Freq: Once | Status: DC

## 2022-11-12 MED ORDER — APIXABAN (ELIQUIS) VTE STARTER PACK (10MG AND 5MG)
ORAL_TABLET | ORAL | 0 refills | Status: DC
  Filled 2022-11-12 – 2022-11-13 (×2): qty 74, 30d supply, fill #0

## 2022-11-12 MED ORDER — APIXABAN (ELIQUIS) EDUCATION KIT FOR DVT/PE PATIENTS: PACK | Freq: Once | Status: AC

## 2022-11-12 NOTE — ED Triage Notes (Signed)
Pt states for the last couple days she has been having left lower leg pain. States ibuprofen helps some but is still having a burning pain and redness.

## 2022-11-12 NOTE — ED Provider Notes (Signed)
[facility] [facility] Provider Note  CSN: [id number] Arrival date & time: 11/12/22 1251  Chief Complaint(s) Leg Pain  HPI Kimberly Griffith is a 67 y.o. female with past medical history as below, significant for endometrial cancer, CKD, who presents to the ED with complaint of left leg pain and swelling, sent by urgent care for further evaluation  Patient with left lower extremity swelling, pain, warmth over the past few days.  She recent returned from a cruise in [place].  No respiratory complaints for no history of DVT.  Does report she had thrombophlebitis during prior pregnancy.  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  No recent fevers or chills, no recent cuts or wounds to her lower extremities.  No nausea or vomiting.  No recent insect bites  Past Medical History Past Medical History:  Diagnosis Date   Allergy    kiwi   Blood transfusion without reported diagnosis 1993   Dx'd with E.Coli   Cancer (HCC) 2017   endometrial   Chronic kidney disease    Headache    History of kidney stones 2008   Pneumonia 2015   Tremor    left thumb   Patient Active Problem List   Diagnosis Date Noted   Foot sprain, left, subsequent encounter 11/28/2019   Endometrial cancer (HCC) 07/05/2015   Home Medication(s) Prior to Admission medications   Medication Sig Start Date End Date Taking? Authorizing Provider  cephALEXin (KEFLEX) 500 MG capsule Take 1 capsule (500 mg total) by mouth 2 (two) times daily for 10 days. 11/12/22 11/22/22 Yes Sloan Leiter, DO                                                                                                                                    Past Surgical History Past Surgical History:  Procedure Laterality Date   LYMPH NODE BIOPSY Bilateral 07/13/2015   Procedure: SENTINEL LYMPH NODE BIOPSY;  Surgeon: Laurette Schimke, MD;  Location: [facility];  Service: Gynecology;  Laterality: Bilateral;   ROBOTIC  ASSISTED TOTAL HYSTERECTOMY WITH BILATERAL SALPINGO OOPHERECTOMY Bilateral 07/13/2015   Procedure: XI ROBOTIC ASSISTED LAPAROSCOPIC TOTAL HYSTERECTOMY WITH BILATERAL SALPINGO OOPHORECTOMY;  Surgeon: Laurette Schimke, MD;  Location: [facility];  Service: Gynecology;  Laterality: Bilateral;   TONSILLECTOMY AND ADENOIDECTOMY     WISDOM TOOTH EXTRACTION     Family History Family History  Problem Relation Age of Onset   Hypertension Mother    Hypertension Maternal Grandmother    Stroke Maternal Grandmother    Stomach cancer Maternal Grandmother    Heart disease Paternal Grandfather    Other Father        dec unknown cause age 22   Hypertension Father    Prostate cancer Father    Colon cancer Neg Hx    Rectal cancer Neg Hx    Esophageal cancer Neg Hx    Liver cancer Neg  Hx     Social History Social History   Tobacco Use   Smoking status: Former    Current packs/day: 0.00    Types: Cigarettes    Quit date: 07/04/1984    Years since quitting: 38.3   Smokeless tobacco: Former    Quit date: 02/21/1984   Allergies Amoxicillin and Kiwi extract  Review of Systems Review of Systems  Constitutional:  Negative for chills, diaphoresis and fever.  Respiratory:  Negative for chest tightness and shortness of breath.   Cardiovascular:  Positive for leg swelling. Negative for chest pain and palpitations.  Gastrointestinal:  Negative for abdominal pain, nausea and vomiting.  Skin:  Positive for color change.  All other systems reviewed and are negative.   Physical Exam Vital Signs  I have reviewed the triage vital signs BP (!) 158/78   Pulse 70   Temp (!) 97.4 F (36.3 C)   Resp 16   Ht 5\' 4"  (1.626 m)   Wt 96.6 kg   LMP 02/21/2011 (Approximate)   SpO2 98%   BMI 36.56 kg/m  Physical Exam Vitals and nursing note reviewed.  Constitutional:      General: She is not in acute distress.    Appearance: Normal appearance. She is well-developed. She is not ill-appearing.  HENT:     Head:  Normocephalic and atraumatic.     Right Ear: External ear normal.     Left Ear: External ear normal.     Nose: Nose normal.     Mouth/Throat:     Mouth: Mucous membranes are moist.  Eyes:     General: No scleral icterus.       Right eye: No discharge.        Left eye: No discharge.  Cardiovascular:     Rate and Rhythm: Normal rate.  Pulmonary:     Effort: Pulmonary effort is normal. No respiratory distress.     Breath sounds: No stridor.  Abdominal:     General: Abdomen is flat. There is no distension.     Tenderness: There is no guarding.  Musculoskeletal:        General: No deformity.     Cervical back: No rigidity.     Comments: Varicosities noted to bilateral lower extremities  Lower extremity swelling greater than right lower extremity  Erythema, TTP to medial aspect of left calf.  Erythema spreading distally towards ankle  Skin:    General: Skin is warm and dry.     Coloration: Skin is not cyanotic, jaundiced or pale.  Neurological:     Mental Status: She is alert and oriented to person, place, and time.     GCS: GCS eye subscore is 4. GCS verbal subscore is 5. GCS motor subscore is 6.  Psychiatric:        Speech: Speech normal.        Behavior: Behavior normal. Behavior is cooperative.     ED Results and Treatments Labs (all labs ordered are listed, but only abnormal results are displayed) Labs Reviewed  BASIC METABOLIC PANEL - Abnormal; Notable for the following components:      Result Value   Calcium 8.8 (*)    All other components within normal limits  CBC WITH DIFFERENTIAL/PLATELET  Radiology No results found.  Pertinent labs & imaging results that were available during my care of the patient were reviewed by me and considered in my medical decision making (see MDM for details).  Medications Ordered in ED Medications - No data to  display                                                                                                                                   Procedures Procedures  (including critical care time)  Medical Decision Making / ED Course    Medical Decision Making:    Kimberly Griffith is a 67 y.o. female with past medical history as below, significant for endometrial cancer status post hysterectomy, CKD, who presents to the ED with complaint of left leg pain and swelling, sent by urgent care for further evaluation. The complaint involves an extensive differential diagnosis and also carries with it a high risk of complications and morbidity.  Serious etiology was considered. Ddx includes but is not limited to: DVT, SVT, cellulitis, musculoskeletal, venous stasis dermatitis, neoplasm, etc.  Complete initial physical exam performed, notably the patient  was no acute distress, blood pressure mildly elevated.    Reviewed and confirmed nursing documentation for past medical history, family history, social history.  Vital signs reviewed.    Clinical Course as of 11/12/22 1518  Sun Nov 12, 2022  1505 Stable  64 YOF with  a ccx of left leg swelling DVT pending. Recent travel to [place]. [CC]    Clinical Course User Index [CC] Glyn Ade, MD     Patient history of varicose veins, prior thrombophlebitis here with left calf pain and swelling.  Get duplex, screening labs. She has no respiratory complaints, low risk Wells score.  PE seems unlikely  Patient also reports that she does not have PCP and is interested in starting a blood pressure medication, per spouse she was previously on HCTZ but the prescription ran out and she did not have a PCP to get a refill from  Labs reviewed- okay  DVT u/s pending  Possible cellulitis to LLE, appears mild. Will cover with abx for home. Not septic, low suspicion for deep space infection  Signed out to incoming EDP at shift change pending  imaging             Additional history obtained: -Additional history obtained from spouse -External records from outside source obtained and reviewed including: Chart review including previous notes, labs, imaging, consultation notes including  Medications, primary care documentation   Lab Tests: -I ordered, reviewed, and interpreted labs.   The pertinent results include:   Labs Reviewed  BASIC METABOLIC PANEL - Abnormal; Notable for the following components:      Result Value   Calcium 8.8 (*)    All other components within normal limits  CBC WITH DIFFERENTIAL/PLATELET    Notable for stable labs  EKG   EKG Interpretation Date/Time:  Ventricular Rate:    PR Interval:    QRS Duration:    QT Interval:    QTC Calculation:   R Axis:      Text Interpretation:           Imaging Studies ordered: I ordered imaging studies including DVT US I independently visualized the following imaging with scope of interpretation limited to determining acute life threatening conditions related to emergency care; findings noted above, significant for PENDING I independently visualized and interpreted imaging. I agree with the radiologist interpretation   Medicines ordered and prescription drug management: Meds ordered this encounter  Medications   cephALEXin (KEFLEX) 500 MG capsule    Sig: Take 1 capsule (500 mg total) by mouth 2 (two) times daily for 10 days.    Dispense:  20 capsule    Refill:  0    -I have reviewed the patients home medicines and have made adjustments as needed   Consultations Obtained: Not applicable  Cardiac Monitoring: Continuous pulse oximetry interpreted by myself, 99% on RA.    Social Determinants of Health:  Diagnosis or treatment significantly limited by social determinants of health: former smoker and obesity no PCP   Reevaluation: After the interventions noted above, I reevaluated the patient and found that they have stayed the  same  Co morbidities that complicate the patient evaluation  Past Medical History:  Diagnosis Date   Allergy    kiwi   Blood transfusion without reported diagnosis 1993   Dx'd with E.Coli   Cancer (HCC) 2017   endometrial   Chronic kidney disease    Headache    History of kidney stones 2008   Pneumonia 2015   Tremor    left thumb      Dispostion: Disposition decision including need for hospitalization was considered, and patient disposition pending at time of sign out.    Final Clinical Impression(s) / ED Diagnoses Final diagnoses:  Leg swelling  Pain of left lower extremity        Sloan Leiter, DO 11/12/22 1519

## 2022-11-12 NOTE — ED Notes (Signed)
Ultrasound is at bedside

## 2022-11-12 NOTE — ED Provider Notes (Signed)
Care of patient received from prior provider at 3:06 PM, please see their note for complete H/P and care plan.  Received handoff per ED course.  Clinical Course as of 11/12/22 1506  Sun Nov 12, 2022  1505 Stable  18 YOF with  a ccx of left leg swelling DVT pending. Recent travel to [place]. [CC]    Clinical Course User Index [CC] Glyn Ade, MD    Reassessment: DVT + per technologist. Final radiology read pending.  Will treat with Eliquis with plan for patient to pick up her medications in the outpatient pharmacy in the morning.  Discussed risk and benefits of this medication patient is consented to use the medication.  Education Provided.  Patient stable for outpatient care at this time.     Glyn Ade, MD 11/12/22 1555

## 2022-11-12 NOTE — ED Notes (Signed)
Patient is being discharged from the Urgent Care and sent to the Emergency Department via POV . Per Verlan Friends NP, patient is in need of higher level of care due to rule out DVT. Patient is aware and verbalizes understanding of plan of care.  Vitals:   11/12/22 1210  BP: (!) 163/86  Pulse: 86  Resp: 18  Temp: 98.4 F (36.9 C)  SpO2: 97%    Pt advised by provider to go straight to [facility] ED for further evaluation. Pt left with husband in vehicle.

## 2022-11-12 NOTE — ED Provider Notes (Signed)
[facility]    CSN: [id number] Arrival date & time: 11/12/22  1144      History   Chief Complaint Chief Complaint  Patient presents with   Leg Pain    HPI Latosha Pribble is a 67 y.o. female.   HPI Pleasant 67 year old female presents with left lower leg pain and burning that began 3 days ago this past Thursday, 11/09/2022.  Reports ibuprofen helps with pain but still has burning and redness.  Patient is accompanied by her husband today. PMH significant for endometrial cancer, CKD, and history of nephrolithiasis.  Past Medical History:  Diagnosis Date   Allergy    kiwi   Blood transfusion without reported diagnosis 1993   Dx'd with E.Coli   Cancer (HCC) 2017   endometrial   Chronic kidney disease    Headache    History of kidney stones 2008   Pneumonia 2015   Tremor    left thumb    Patient Active Problem List   Diagnosis Date Noted   Foot sprain, left, subsequent encounter 11/28/2019   Endometrial cancer (HCC) 07/05/2015    Past Surgical History:  Procedure Laterality Date   LYMPH NODE BIOPSY Bilateral 07/13/2015   Procedure: SENTINEL LYMPH NODE BIOPSY;  Surgeon: Laurette Schimke, MD;  Location: [facility];  Service: Gynecology;  Laterality: Bilateral;   ROBOTIC ASSISTED TOTAL HYSTERECTOMY WITH BILATERAL SALPINGO OOPHERECTOMY Bilateral 07/13/2015   Procedure: XI ROBOTIC ASSISTED LAPAROSCOPIC TOTAL HYSTERECTOMY WITH BILATERAL SALPINGO OOPHORECTOMY;  Surgeon: Laurette Schimke, MD;  Location: [facility];  Service: Gynecology;  Laterality: Bilateral;   TONSILLECTOMY AND ADENOIDECTOMY     WISDOM TOOTH EXTRACTION      OB History     Gravida  7   Para  6   Term      Preterm      AB  1   Living  6      SAB  1   IAB      Ectopic      Multiple      Live Births               Home Medications    Prior to Admission medications   Not on File    Family History Family History  Problem Relation Age of Onset   Hypertension Mother    Hypertension  Maternal Grandmother    Stroke Maternal Grandmother    Stomach cancer Maternal Grandmother    Heart disease Paternal Grandfather    Other Father        dec unknown cause age 85   Hypertension Father    Prostate cancer Father    Colon cancer Neg Hx    Rectal cancer Neg Hx    Esophageal cancer Neg Hx    Liver cancer Neg Hx     Social History Social History   Tobacco Use   Smoking status: Former    Current packs/day: 0.00    Types: Cigarettes    Quit date: 07/04/1984    Years since quitting: 38.3   Smokeless tobacco: Former    Quit date: 02/21/1984     Allergies   Amoxicillin and Kiwi extract   Review of Systems Review of Systems  Skin:        Pain and swelling of her left lower leg x 3 days  All other systems reviewed and are negative.    Physical Exam Triage Vital Signs ED Triage Vitals  Encounter Vitals Group     BP  Systolic BP Percentile      Diastolic BP Percentile      Pulse      Resp      Temp      Temp src      SpO2      Weight      Height      Head Circumference      Peak Flow      Pain Score      Pain Loc      Pain Education      Exclude from Growth Chart    No data found.  Updated Vital Signs BP (!) 163/86 (BP Location: Right Arm) Comment: not on meds, no PCP  Pulse 86   Temp 98.4 F (36.9 C) (Oral)   Resp 18   LMP 02/21/2011 (Approximate)   SpO2 97%    Physical Exam Vitals and nursing note reviewed.  Constitutional:      Appearance: Normal appearance. She is obese.  HENT:     Head: Normocephalic and atraumatic.     Mouth/Throat:     Mouth: Mucous membranes are moist.     Pharynx: Oropharynx is clear.  Eyes:     Extraocular Movements: Extraocular movements intact.     Conjunctiva/sclera: Conjunctivae normal.     Pupils: Pupils are equal, round, and reactive to light.  Cardiovascular:     Rate and Rhythm: Normal rate and regular rhythm.     Pulses: Normal pulses.     Heart sounds: Normal heart sounds.  Pulmonary:      Effort: Pulmonary effort is normal.     Breath sounds: Normal breath sounds. No wheezing, rhonchi or rales.  Musculoskeletal:        General: Normal range of motion.     Cervical back: Normal range of motion and neck supple.  Skin:    General: Skin is warm and dry.     Comments: Left lower leg (superior medial aspect): Extremely TTP, significant erythema with distal numerous varicosities noted please see image below  Neurological:     General: No focal deficit present.     Mental Status: She is alert and oriented to person, place, and time. Mental status is at baseline.  Psychiatric:        Mood and Affect: Mood normal.        Behavior: Behavior normal.      UC Treatments / Results  Labs (all labs ordered are listed, but only abnormal results are displayed) Labs Reviewed - No data to display  EKG   Radiology No results found.  Procedures Procedures (including critical care time)  Medications Ordered in UC Medications - No data to display  Initial Impression / Assessment and Plan / UC Course  I have reviewed the triage vital signs and the nursing notes.  Pertinent labs & imaging results that were available during my care of the patient were reviewed by me and considered in my medical decision making (see chart for details).     MDM: 1.  Pain and swelling of left lower leg-extremely concerning for DVT given recent history per patient.  Advised patient to go to [facility] ED now for further evaluation of left lower leg pain and swelling and to rule out DVT.  Patient agreed and verbalized understanding of these instructions and this plan of care today.  Patient is accompanied by her husband who will be driving her by POV.  Patient discharged home, hemodynamically stable.  Final  Clinical Impressions(s) / UC Diagnoses   Final diagnoses:  Pain and swelling of left lower leg     Discharge Instructions      Advised patient to go to [facility] ED now for further evaluation of left lower leg pain and swelling and to rule out DVT.     ED Prescriptions   None    PDMP not reviewed this encounter.   Trevor Iha, FNP 11/12/22 1250

## 2022-11-12 NOTE — ED Triage Notes (Signed)
Pt c/o redness, swelling and pain to LLE since Wed

## 2022-11-12 NOTE — Discharge Instructions (Addendum)
Advised patient to go to [facility] ED now for further evaluation of left lower leg pain and swelling and to rule out DVT.

## 2022-11-13 ENCOUNTER — Other Ambulatory Visit: Payer: Self-pay

## 2022-11-13 ENCOUNTER — Other Ambulatory Visit (HOSPITAL_BASED_OUTPATIENT_CLINIC_OR_DEPARTMENT_OTHER): Payer: Self-pay

## 2022-11-17 ENCOUNTER — Telehealth (HOSPITAL_COMMUNITY): Payer: Self-pay

## 2022-11-17 ENCOUNTER — Other Ambulatory Visit (HOSPITAL_COMMUNITY): Payer: Self-pay

## 2022-11-17 NOTE — Telephone Encounter (Signed)
DVT Patient Advocate Encounter  Prior authorization for Eliquis has been submitted and approved by phone. Test billing returns $82.59 for 30 day supply.  Case ID [id number] Effective: 11/17/2022 to 11/17/2023  This coverage is through a commercial plan, and patient will be eligible to use copay savings card for $10 per 30 days, as there is an unmet deductible on the secondary coverage at this time.  Burnell Blanks, CPhT Rx Patient Advocate Phone: [phone]

## 2022-11-20 ENCOUNTER — Ambulatory Visit (HOSPITAL_COMMUNITY)
Admission: RE | Admit: 2022-11-20 | Discharge: 2022-11-20 | Disposition: A | Discharge status: Home / Self Care | Payer: Managed Care, Other (non HMO) | Source: Ambulatory Visit | Attending: Vascular Surgery | Admitting: Vascular Surgery

## 2022-11-20 ENCOUNTER — Other Ambulatory Visit (HOSPITAL_COMMUNITY): Payer: Self-pay

## 2022-11-20 ENCOUNTER — Encounter (HOSPITAL_COMMUNITY): Payer: Self-pay

## 2022-11-20 VITALS — BP 156/82 | HR 67

## 2022-11-20 DIAGNOSIS — I82432 Acute embolism and thrombosis of left popliteal vein: Secondary | ICD-10-CM

## 2022-11-20 MED ORDER — APIXABAN 5 MG PO TABS
5.0000 mg | ORAL_TABLET | Freq: Two times a day (BID) | ORAL | 0 refills | Status: DC
  Filled 2022-11-20 – 2022-12-04 (×3): qty 60, 30d supply, fill #0

## 2022-11-20 NOTE — Patient Instructions (Addendum)
-  Continue apixaban (Eliquis) 5 mg twice daily. -Your refills have been sent to [facility] for home delivery. You may need to call the pharmacy to ask them to fill this when you start to run low on your current supply. [phone]. Your payment and copay card are on file, and this should be a $10 copay.  -It is important to take your medication around the same time every day.  -Avoid NSAIDs like ibuprofen (Advil, Motrin) and naproxen (Aleve) as well as aspirin doses over 100 mg daily. -Tylenol (acetaminophen) is the preferred over the counter pain medication to lower the risk of bleeding. -Be sure to alert all of your health care providers that you are taking an anticoagulant prior to starting a new medication or having a procedure. -Monitor for signs and symptoms of bleeding (abnormal bruising, prolonged bleeding, nose bleeds, bleeding from gums, discolored urine, black tarry stools). If you have fallen and hit your head OR if your bleeding is severe or not stopping, seek emergency care.  -Go to the emergency room if emergent signs and symptoms of new clot occur (new or worse swelling and pain in an arm or leg, shortness of breath, chest pain, fast or irregular heartbeats, lightheadedness, dizziness, fainting, coughing up blood) or if you experience a significant color change (pale or blue) in the extremity that has the DVT.  -We recommend you wear compression stockings (20-30 mmHg) as long as you are having swelling or pain. Be sure to purchase the correct size and take them off at night.   Your next visit is on December 11th at 2:30pm.  [facility] [address] [place], [place], [place] [address] Enter the hospital through Entrance C off [facility] and pull up to the [facility] entrance to the free valet parking.  Check in for your appointment at the Heart & Vascular Center.   If you have any questions or need to reschedule an appointment, please call  [phone] [facility].  If you are having an emergency, call 911 or present to the nearest emergency room.   What is a DVT?  -Deep vein thrombosis (DVT) is a condition in which a blood clot forms in a vein of the deep venous system which can occur in the lower leg, thigh, pelvis, arm, or neck. This condition is serious and can be life-threatening if the clot travels to the arteries of the lungs and causing a blockage (pulmonary embolism, PE). A DVT can also damage veins in the leg, which can lead to long-term venous disease, leg pain, swelling, discoloration, and ulcers or sores (post-thrombotic syndrome).  -Treatment may include taking an anticoagulant medication to prevent more clots from forming and the current clot from growing, wearing compression stockings, and/or surgical procedures to remove or dissolve the clot.

## 2022-11-20 NOTE — Progress Notes (Signed)
DVT Clinic Note  Name: Kimberly Griffith     MRN: [id number]     DOB: 1955-06-16     Sex: female  PCP: Patient, No Pcp Per  Today's Visit: Visit Information: Initial Visit  Referred to DVT Clinic by: Emergency Department - Dr. Doran Durand Referred to CPP by: Dr. Chestine Spore Reason for referral:  Chief Complaint  Patient presents with   Med Management - DVT   HISTORY OF PRESENT ILLNESS: Kimberly Griffith is a 67 y.o. female with PMH CKD, endometrial cancer s/p hysterectomy, who presents after diagnosis of DVT for medication management. She presented to the ED 11/12/22 with LLE pain, redness, and swelling for a few days. She recently returned from a cruise in [place]. No prior history of VTE but did have thrombophlebitis during prior pregnancy. Has varicose veins bilaterally. Ultrasound showed nonocclusive DVT involving the tibioperoneal trunk and extending into the popliteal vein as well as thrombosed varicose veins. She was started on Eliquis.   Today, patient is ambulating independently and accompanied by her husband. Her pain and swelling have started to improve. Denies abnormal bleeding or bruising. Denies missed doses of Eliquis. She takes it at [employer] and 6pm daily. Denies wearing compression stockings. She tells me that they flew to [place] then got on the cruise ship and cruised around [place] for 10 days. In the first couple days of the trip, she got COVID and took Paxlovid. She does not have a PCP but would like to establish with one. She is not up to date on cancer screenings.   Positive Thrombotic Risk Factors: Sedentary journey lasting >8 hours within 4 weeks, Recent COVID diagnosis (within 3 months) Bleeding Risk Factors: Age >65 years, Anticoagulant therapy  Negative Thrombotic Risk Factors: Previous VTE, Recent surgery (within 3 months), Recent trauma (within 3 months), Recent admission to hospital with acute illness (within 3 months), Paralysis, paresis, or recent plaster cast immobilization of lower  extremity, Central venous catheterization, Bed rest >72 hours within 3 months, Pregnancy, Within 6 weeks postpartum, Recent cesarean section (within 3 months), Estrogen therapy, Testosterone therapy, Erythropoiesis-stimulating agent, Active cancer, Non-malignant, chronic inflammatory condition, Known thrombophilic condition, Smoking, Obesity, Older age  Rx Insurance Coverage:  Commercial Medicare Rx Affordability: PA for Eliquis approved under her [place] plan. Would cost $82 on her insurance. Activated copay card to fill for $10/month.  Rx Assistance Provided:  Co-pay card Prior authorization approved Preferred Pharmacy: Filled refill during visit at [facility]. Remaining refill transferred to [facility] and scheduled for delivery next month.   Past Medical History:  Diagnosis Date   Allergy    kiwi   Blood transfusion without reported diagnosis 1993   Dx'd with E.Coli   Cancer (HCC) 2017   endometrial   Chronic kidney disease    Headache    History of kidney stones 2008   Pneumonia 2015   Tremor    left thumb    Past Surgical History:  Procedure Laterality Date   LYMPH NODE BIOPSY Bilateral 07/13/2015   Procedure: SENTINEL LYMPH NODE BIOPSY;  Surgeon: Laurette Schimke, MD;  Location: [facility];  Service: Gynecology;  Laterality: Bilateral;   ROBOTIC ASSISTED TOTAL HYSTERECTOMY WITH BILATERAL SALPINGO OOPHERECTOMY Bilateral 07/13/2015   Procedure: XI ROBOTIC ASSISTED LAPAROSCOPIC TOTAL HYSTERECTOMY WITH BILATERAL SALPINGO OOPHORECTOMY;  Surgeon: Laurette Schimke, MD;  Location: [facility];  Service: Gynecology;  Laterality: Bilateral;   TONSILLECTOMY AND ADENOIDECTOMY     WISDOM TOOTH EXTRACTION      Social History   Socioeconomic History   Marital  status: Married    Spouse name: Not on file   Number of children: 6   Years of education: Not on file   Highest education level: Not on file  Occupational History   Occupation: Emergency planning/management officer  Tobacco Use   Smoking status: Former    Current  packs/day: 0.00    Types: Cigarettes    Quit date: 07/04/1984    Years since quitting: 38.4   Smokeless tobacco: Former    Quit date: 02/21/1984  Substance and Sexual Activity   Alcohol use: Not on file    Comment: one drink/month   Drug use: Not on file   Sexual activity: Yes    Partners: Male    Birth control/protection: Post-menopausal, Surgical  Other Topics Concern   Not on file  Social History Narrative   Not on file   Social Determinants of Health   Financial Resource Strain: Not on file  Food Insecurity: Not on file  Transportation Needs: Not on file  Physical Activity: Not on file  Stress: Not on file  Social Connections: Not on file  Intimate Partner Violence: Not on file    Family History  Problem Relation Age of Onset   Hypertension Mother    Hypertension Maternal Grandmother    Stroke Maternal Grandmother    Stomach cancer Maternal Grandmother    Heart disease Paternal Grandfather    Other Father        dec unknown cause age 21   Hypertension Father    Prostate cancer Father    Colon cancer Neg Hx    Rectal cancer Neg Hx    Esophageal cancer Neg Hx    Liver cancer Neg Hx     Allergies as of 11/20/2022 - Review Complete 11/20/2022  Allergen Reaction Noted   Amoxicillin Diarrhea and Nausea And Vomiting 11/12/2022   Kiwi extract Swelling and Other (See Comments) 07/07/2015    Current Outpatient Medications on File Prior to Encounter  Medication Sig Dispense Refill   APIXABAN (ELIQUIS) VTE STARTER PACK (10MG  AND 5MG ) Take as directed on package: start with two-5mg  tablets twice daily for 7 days. On day 8, switch to one-5mg  tablet twice daily. 74 each 0   cephALEXin (KEFLEX) 500 MG capsule Take 1 capsule (500 mg total) by mouth 2 (two) times daily for 10 days. 20 capsule 0   Current Facility-Administered Medications on File Prior to Encounter  Medication Dose Route Frequency Provider Last Rate Last Admin   0.9 %  sodium chloride infusion  500 mL  Intravenous Continuous Armbruster, Willaim Rayas, MD       REVIEW OF SYSTEMS:  Review of Systems  Respiratory:  Negative for shortness of breath.   Cardiovascular:  Positive for leg swelling. Negative for chest pain and palpitations.  Musculoskeletal:  Positive for myalgias.  Neurological:  Positive for tingling. Negative for dizziness.   PHYSICAL EXAMINATION:  Vitals:   11/20/22 1410  BP: (!) 156/82  Pulse: 67  SpO2: 99%   Physical Exam Vitals reviewed.  Cardiovascular:     Rate and Rhythm: Normal rate.  Pulmonary:     Effort: Pulmonary effort is normal.  Musculoskeletal:        General: Tenderness present.     Left lower leg: Edema present.  Skin:    Findings: Erythema present.     Comments: Varicose veins on legs bilaterally  Psychiatric:        Mood and Affect: Mood normal.        Behavior: Behavior  normal.        Thought Content: Thought content normal.   Villalta Score for Post-Thrombotic Syndrome: Pain: Moderate Cramps: Absent Heaviness: Mild Paresthesia: Moderate Pruritus: Absent Pretibial Edema: Moderate Skin Induration: Absent Hyperpigmentation: Absent Redness: Mild Venous Ectasia: Mild Pain on calf compression: Mild Villalta Preliminary Score: 10 Is venous ulcer present?: No If venous ulcer is present and score is <15, then 15 points total are assigned: Absent Villalta Total Score: 10  LABS:  CBC     Component Value Date/Time   WBC 6.5 11/12/2022 1333   RBC 4.47 11/12/2022 1333   HGB 13.3 11/12/2022 1333   HCT 40.2 11/12/2022 1333   PLT 244 11/12/2022 1333   MCV 89.9 11/12/2022 1333   MCV 86.3 10/19/2013 1135   MCH 29.8 11/12/2022 1333   MCHC 33.1 11/12/2022 1333   RDW 13.5 11/12/2022 1333   LYMPHSABS 2.0 11/12/2022 1333   MONOABS 0.6 11/12/2022 1333   EOSABS 0.2 11/12/2022 1333   BASOSABS 0.1 11/12/2022 1333    Hepatic Function      Component Value Date/Time   PROT 7.3 07/09/2015 1100   ALBUMIN 4.2 07/09/2015 1100   AST 24 07/09/2015  1100   ALT 22 07/09/2015 1100   ALKPHOS 72 07/09/2015 1100   BILITOT 0.7 07/09/2015 1100    Renal Function   Lab Results  Component Value Date   CREATININE 0.50 11/12/2022   CREATININE 0.72 11/26/2015   CREATININE 0.58 07/14/2015    Estimated Creatinine Clearance: 78.1 mL/min (by C-G formula based on SCr of 0.5 mg/dL).   Vascular Lab Studies:  11/12/22 doppler IMPRESSION: 1. Partially occlusive thrombus within the tibioperoneal trunk and gastrocnemius vein in the upper calf extending into the distal left popliteal vein. 2. Underlying the palpable area of clinical concern are thrombosed varicose veins.  ASSESSMENT: Location of DVT: Left distal vein, Left popliteal vein Cause of DVT: provoked by a transient risk factor - recent prolonged travel to [place] as well as COVID infection on that trip, immediately preceding symptom onset. She was appropriately started on Eliquis in the ED. We recommend treating her first provoked DVT with anticoagulation for 3 months. We have assisted with getting access to refills of the medication through prior authorization approval and copay cards. No concerns regarding medication access or affordability at this time. Recommended she purchase compression stockings to help with swelling. All of her questions have been answered at this time.   Patient does not currently have a PCP. She has elevated BP today and in the ED. She is not currently up to date on cancer screenings. Assisted the patient with scheduling a visit to establish care with a [facility] PCP close to her home. This is scheduled 12/06/22.   PLAN: -Continue apixaban (Eliquis) 5 mg twice daily. -Expected duration of therapy: 3 months. Therapy started on 11/12/22. -Patient educated on purpose, proper use and potential adverse effects of apixaban (Eliquis). -Discussed importance of taking medication around the same time every day. -Advised patient of medications to avoid (NSAIDs, aspirin doses >100 mg  daily). -Educated that Tylenol (acetaminophen) is the preferred analgesic to lower the risk of bleeding. -Advised patient to alert all providers of anticoagulation therapy prior to starting a new medication or having a procedure. -Emphasized importance of monitoring for signs and symptoms of bleeding (abnormal bruising, prolonged bleeding, nose bleeds, bleeding from gums, discolored urine, black tarry stools). -Educated patient to present to the ED if emergent signs and symptoms of new thrombosis occur. -Counseled patient to wear  compression stockings daily, removing at night. Elevate legs to help with swelling.   Follow up: Establish with PCP next month. Follow up in DVT Clinic in December for end of treatment visit.   Pervis Hocking, PharmD, [place]y [place], CPP Deep Vein Thrombosis Clinic Clinical Pharmacist Practitioner Office: [phone]

## 2022-12-06 ENCOUNTER — Ambulatory Visit (INDEPENDENT_AMBULATORY_CARE_PROVIDER_SITE_OTHER): Payer: BLUE CROSS/BLUE SHIELD | Admitting: Family Medicine

## 2022-12-06 ENCOUNTER — Encounter: Payer: Self-pay | Admitting: Family Medicine

## 2022-12-06 VITALS — BP 175/92 | HR 76 | Ht 63.0 in | Wt 215.2 lb

## 2022-12-06 DIAGNOSIS — Z8542 Personal history of malignant neoplasm of other parts of uterus: Secondary | ICD-10-CM

## 2022-12-06 DIAGNOSIS — R251 Tremor, unspecified: Secondary | ICD-10-CM | POA: Diagnosis not present

## 2022-12-06 DIAGNOSIS — I10 Essential (primary) hypertension: Secondary | ICD-10-CM | POA: Diagnosis not present

## 2022-12-06 MED ORDER — HYDROCHLOROTHIAZIDE 12.5 MG PO TABS: 12.5000 mg | ORAL_TABLET | Freq: Every day | ORAL | 3 refills | Status: DC

## 2022-12-06 NOTE — Assessment & Plan Note (Signed)
-   worse with movement, offered beta blocker therapy but patient says she is more concerned about what is causing this and would like referral to neurology - no family hx of movement disorders or parkinsons

## 2022-12-06 NOTE — Progress Notes (Signed)
New patient visit   Patient: Kimberly Griffith   DOB: 08/01/1955   67 y.o. Female  MRN: [id number] Visit Date: 12/06/2022  Today's healthcare provider: Charlton Amor, DO   Chief Complaint  Patient presents with   New Patient (Initial Visit)    Establish care    SUBJECTIVE    Chief Complaint  Patient presents with   New Patient (Initial Visit)    Establish care   HPI HPI     New Patient (Initial Visit)    Additional comments: Establish care      Last edited by Roselyn Reef, CMA on 12/06/2022  3:17 PM.       Pt presents today to establish care.   Has concerns today of her elevated blood pressure  Has a pmh of endometrial cancer s/p hysterectomy. She has not followed with OBGYN in a while.  Has hx of dvt diagnosed in 10/2022 and currently on eliquis therapy.   Review of Systems  Constitutional:  Negative for activity change, fatigue and fever.  Respiratory:  Negative for cough and shortness of breath.   Cardiovascular:  Negative for chest pain.  Gastrointestinal:  Negative for abdominal pain.  Genitourinary:  Negative for difficulty urinating.       Current Meds  Medication Sig   apixaban (ELIQUIS) 5 MG TABS tablet Take 1 tablet (5 mg total) by mouth 2 (two) times daily. Start taking after completion of starter pack.   APIXABAN (ELIQUIS) VTE STARTER PACK (10MG  AND 5MG ) Take as directed on package: start with two-5mg  tablets twice daily for 7 days. On day 8, switch to one-5mg  tablet twice daily.   hydrochlorothiazide (HYDRODIURIL) 12.5 MG tablet Take 1 tablet (12.5 mg total) by mouth daily.    OBJECTIVE    BP (!) 175/92 (BP Location: Left Arm, Patient Position: Sitting, Cuff Size: Large)   Pulse 76   Ht 5\' 3"  (1.6 m)   Wt 215 lb 4 oz (97.6 kg)   LMP 02/21/2011 (Approximate)   SpO2 100%   BMI 38.13 kg/m   Physical Exam Vitals and nursing note reviewed.  Constitutional:      General: She is not in acute distress.    Appearance: Normal appearance.   HENT:     Head: Normocephalic and atraumatic.     Right Ear: External ear normal.     Left Ear: External ear normal.     Nose: Nose normal.  Eyes:     Conjunctiva/sclera: Conjunctivae normal.  Cardiovascular:     Rate and Rhythm: Normal rate and regular rhythm.  Pulmonary:     Effort: Pulmonary effort is normal.     Breath sounds: Normal breath sounds.  Neurological:     General: No focal deficit present.     Mental Status: She is alert and oriented to person, place, and time.  Psychiatric:        Mood and Affect: Mood normal.        Behavior: Behavior normal.        Thought Content: Thought content normal.        Judgment: Judgment normal.        ASSESSMENT & PLAN    Problem List Items Addressed This Visit       Cardiovascular and Mediastinum   Primary hypertension - Primary    Bp uncontrolled and has been at multiple doctor visits. Pt used to be on medicine - have ordered hydrochlorothiazide 12.5mg  follow up in 2 weeks for blood work  Relevant Medications   hydrochlorothiazide (HYDRODIURIL) 12.5 MG tablet     Other   Tremor    - worse with movement, offered beta blocker therapy but patient says she is more concerned about what is causing this and would like referral to neurology - no family hx of movement disorders or parkinsons      Relevant Orders   Ambulatory referral to Neurology   Other Visit Diagnoses     History of endometrial cancer       Relevant Orders   Ambulatory referral to Obstetrics / Gynecology       Return in about 2 weeks (around 12/20/2022) for HTN follow up and labs.      Meds ordered this encounter  Medications   hydrochlorothiazide (HYDRODIURIL) 12.5 MG tablet    Sig: Take 1 tablet (12.5 mg total) by mouth daily.    Dispense:  30 tablet    Refill:  3    Orders Placed This Encounter  Procedures   Ambulatory referral to Obstetrics / Gynecology    Referral Priority:   Routine    Referral Type:   Consultation     Referral Reason:   Specialty Services Required    Requested Specialty:   Obstetrics and Gynecology    Number of Visits Requested:   1   Ambulatory referral to Neurology    Referral Priority:   Routine    Referral Type:   Consultation    Referral Reason:   Specialty Services Required    Requested Specialty:   Neurology    Number of Visits Requested:   1     Charlton Amor, DO  [facility] at [facility] [phone] (phone) [phone] (fax)  [facility]

## 2022-12-06 NOTE — Assessment & Plan Note (Signed)
Bp uncontrolled and has been at multiple doctor visits. Pt used to be on medicine - have ordered hydrochlorothiazide 12.5mg  follow up in 2 weeks for blood work

## 2022-12-13 ENCOUNTER — Other Ambulatory Visit (HOSPITAL_COMMUNITY): Payer: Self-pay

## 2022-12-20 ENCOUNTER — Ambulatory Visit (INDEPENDENT_AMBULATORY_CARE_PROVIDER_SITE_OTHER): Payer: Managed Care, Other (non HMO) | Admitting: Family Medicine

## 2022-12-20 ENCOUNTER — Encounter: Payer: Self-pay | Admitting: Family Medicine

## 2022-12-20 VITALS — BP 147/76 | HR 84 | Ht 63.0 in | Wt 216.5 lb

## 2022-12-20 DIAGNOSIS — L309 Dermatitis, unspecified: Secondary | ICD-10-CM

## 2022-12-20 DIAGNOSIS — Z7689 Persons encountering health services in other specified circumstances: Secondary | ICD-10-CM | POA: Insufficient documentation

## 2022-12-20 DIAGNOSIS — I10 Essential (primary) hypertension: Secondary | ICD-10-CM | POA: Diagnosis not present

## 2022-12-20 DIAGNOSIS — Z1211 Encounter for screening for malignant neoplasm of colon: Secondary | ICD-10-CM | POA: Diagnosis not present

## 2022-12-20 DIAGNOSIS — B379 Candidiasis, unspecified: Secondary | ICD-10-CM | POA: Insufficient documentation

## 2022-12-20 DIAGNOSIS — Z13820 Encounter for screening for osteoporosis: Secondary | ICD-10-CM | POA: Insufficient documentation

## 2022-12-20 DIAGNOSIS — Z1231 Encounter for screening mammogram for malignant neoplasm of breast: Secondary | ICD-10-CM

## 2022-12-20 DIAGNOSIS — Z1283 Encounter for screening for malignant neoplasm of skin: Secondary | ICD-10-CM | POA: Insufficient documentation

## 2022-12-20 MED ORDER — TRIAMCINOLONE ACETONIDE 0.5 % EX CREA: 1.0000 | TOPICAL_CREAM | Freq: Two times a day (BID) | CUTANEOUS | 3 refills | Status: AC

## 2022-12-20 MED ORDER — ZINC OXIDE 13 % EX CREA: TOPICAL_OINTMENT | Freq: Two times a day (BID) | CUTANEOUS | 2 refills | Status: AC | PRN

## 2022-12-20 MED ORDER — NYSTATIN 100000 UNIT/GM EX POWD: 1.0000 | Freq: Three times a day (TID) | CUTANEOUS | 0 refills | Status: AC

## 2022-12-20 MED ORDER — HYDROCHLOROTHIAZIDE 25 MG PO TABS: 25.0000 mg | ORAL_TABLET | Freq: Every day | ORAL | 3 refills | Status: DC

## 2022-12-20 NOTE — Assessment & Plan Note (Addendum)
Pt uncontrolled today, will increase hydrochlorothiazide to 25mg   - follow up for nurse visit in two weeks for htn check

## 2022-12-20 NOTE — Assessment & Plan Note (Signed)
Pt has numerous (what I believe to be ) seborrheic keratosis that she would like looked at and possibly taken off. Told her we can send to derm for full skin eval because she does have other spots that should be looked over for full skin check

## 2022-12-20 NOTE — Progress Notes (Signed)
Established patient visit   Patient: Kimberly Griffith   DOB: Apr 21, 1955   67 y.o. Female  MRN: [id number] Visit Date: 12/20/2022  Today's healthcare provider: Charlton Amor, DO   Chief Complaint  Patient presents with   Hypertension    HTN f/u     SUBJECTIVE    Chief Complaint  Patient presents with   Hypertension    HTN f/u    HPI HPI     Hypertension    Additional comments: HTN f/u       Last edited by Elizabeth Palau, LPN on 47/42/5956  8:15 AM.       Pt presents for HTN follow up. Started on hydrochlorothiazide 12.5mg . BP uncontrolled at 144/54. We will go ahead and increase BP meds. Pt does remember needing to be on 25mg  of hydrochlorothiazide in the past for better control. Denies any side effects.  Has hx of eczema and would like her triamcinolone refilled.   Has notice a rash under her breast and skin fold.    Review of Systems  Constitutional:  Negative for activity change, fatigue and fever.  Respiratory:  Negative for cough and shortness of breath.   Cardiovascular:  Negative for chest pain.  Gastrointestinal:  Negative for abdominal pain.  Genitourinary:  Negative for difficulty urinating.       Current Meds  Medication Sig   apixaban (ELIQUIS) 5 MG TABS tablet Take 1 tablet (5 mg total) by mouth 2 (two) times daily. Start taking after completion of starter pack.   APIXABAN (ELIQUIS) VTE STARTER PACK (10MG  AND 5MG ) Take as directed on package: start with two-5mg  tablets twice daily for 7 days. On day 8, switch to one-5mg  tablet twice daily.   hydrochlorothiazide (HYDRODIURIL) 25 MG tablet Take 1 tablet (25 mg total) by mouth daily.   mupirocin 2% oint-hydrocortisone 2.5% cream-nystatin cream-zinc oxide 13% oint 1:1:1:5 mixture Apply topically 2 (two) times daily as needed.   nystatin (MYCOSTATIN/NYSTOP) powder Apply 1 Application topically 3 (three) times daily.   triamcinolone cream (KENALOG) 0.5 % Apply 1 Application topically 2 (two) times  daily. To affected areas.   [DISCONTINUED] hydrochlorothiazide (HYDRODIURIL) 12.5 MG tablet Take 1 tablet (12.5 mg total) by mouth daily.    OBJECTIVE    BP (!) 144/54   Pulse 84   Ht 5\' 3"  (1.6 m)   Wt 216 lb 8 oz (98.2 kg)   LMP 02/21/2011 (Approximate)   SpO2 100%   BMI 38.35 kg/m   Physical Exam Vitals and nursing note reviewed.  Constitutional:      General: She is not in acute distress.    Appearance: Normal appearance.  HENT:     Head: Normocephalic and atraumatic.     Right Ear: External ear normal.     Left Ear: External ear normal.     Nose: Nose normal.  Eyes:     Conjunctiva/sclera: Conjunctivae normal.  Cardiovascular:     Rate and Rhythm: Normal rate and regular rhythm.  Pulmonary:     Effort: Pulmonary effort is normal.     Breath sounds: Normal breath sounds.  Neurological:     General: No focal deficit present.     Mental Status: She is alert and oriented to person, place, and time.  Psychiatric:        Mood and Affect: Mood normal.        Behavior: Behavior normal.        Thought Content: Thought content normal.  Judgment: Judgment normal.        ASSESSMENT & PLAN    Problem List Items Addressed This Visit       Cardiovascular and Mediastinum   Primary hypertension - Primary    Pt uncontrolled today, will increase hydrochlorothiazide to 25mg   - follow up for nurse visit in two weeks for htn check      Relevant Medications   hydrochlorothiazide (HYDRODIURIL) 25 MG tablet     Other   Candidiasis    Pt has rash under breast and on skin folds, discussed this is likely a candidal infection. Recommend that she use nystatin cream and powder that has been sent in to the pharmacy for her      Relevant Medications   mupirocin 2% oint-hydrocortisone 2.5% cream-nystatin cream-zinc oxide 13% oint 1:1:1:5 mixture   nystatin (MYCOSTATIN/NYSTOP) powder   Screening for malignant neoplasm of skin    Pt has numerous (what I believe to be )  seborrheic keratosis that she would like looked at and possibly taken off. Told her we can send to derm for full skin eval because she does have other spots that should be looked over for full skin check      Relevant Orders   Ambulatory referral to Dermatology   Screening for colon cancer    Pt has hx of polyps and is over due for colon cancer screening, have gone ahead and ordered.       Relevant Orders   Ambulatory referral to Gastroenterology   Other Visit Diagnoses     Encounter for screening mammogram for malignant neoplasm of breast       Relevant Orders   MM DIGITAL SCREENING BILATERAL   Eczema, unspecified type       Relevant Medications   triamcinolone cream (KENALOG) 0.5 %       Return in about 2 weeks (around 01/03/2023) for HTN nurse visit f/u and check ears. needs medicare wellness scheduled .      Meds ordered this encounter  Medications   triamcinolone cream (KENALOG) 0.5 %    Sig: Apply 1 Application topically 2 (two) times daily. To affected areas.    Dispense:  30 g    Refill:  3   mupirocin 2% oint-hydrocortisone 2.5% cream-nystatin cream-zinc oxide 13% oint 1:1:1:5 mixture    Sig: Apply topically 2 (two) times daily as needed.    Dispense:  120 g    Refill:  2   nystatin (MYCOSTATIN/NYSTOP) powder    Sig: Apply 1 Application topically 3 (three) times daily.    Dispense:  15 g    Refill:  0   hydrochlorothiazide (HYDRODIURIL) 25 MG tablet    Sig: Take 1 tablet (25 mg total) by mouth daily.    Dispense:  30 tablet    Refill:  3    Orders Placed This Encounter  Procedures   MM DIGITAL SCREENING BILATERAL    Standing Status:   Future    Standing Expiration Date:   12/20/2023    Order Specific Question:   Preferred imaging location?    Answer:   [facility]    Order Specific Question:   Reason for exam:    Answer:   screening for breast cancer    Order Specific Question:   Release to patient    Answer:   Immediate   Ambulatory  referral to Gastroenterology    Referral Priority:   Routine    Referral Type:   Consultation    Referral  Reason:   Specialty Services Required    Number of Visits Requested:   1   Ambulatory referral to Dermatology    Referral Priority:   Routine    Referral Type:   Consultation    Referral Reason:   Specialty Services Required    Requested Specialty:   Dermatology    Number of Visits Requested:   1     Charlton Amor, DO  [facility] at [facility] [phone] (phone) [phone] (fax)  [facility]

## 2022-12-20 NOTE — Assessment & Plan Note (Signed)
Pt has hx of polyps and is over due for colon cancer screening, have gone ahead and ordered.

## 2022-12-20 NOTE — Assessment & Plan Note (Signed)
Pt has rash under breast and on skin folds, discussed this is likely a candidal infection. Recommend that she use nystatin cream and powder that has been sent in to the pharmacy for her

## 2022-12-20 NOTE — Patient Instructions (Signed)
Ear wash debrox kit (any cheap ear wash kit over the counter)

## 2023-01-03 ENCOUNTER — Ambulatory Visit (INDEPENDENT_AMBULATORY_CARE_PROVIDER_SITE_OTHER): Payer: BLUE CROSS/BLUE SHIELD | Admitting: Family Medicine

## 2023-01-03 VITALS — BP 132/79 | HR 71 | Resp 20

## 2023-01-03 DIAGNOSIS — I10 Essential (primary) hypertension: Secondary | ICD-10-CM | POA: Diagnosis not present

## 2023-01-03 NOTE — Progress Notes (Signed)
   Subjective:    Patient ID: Kimberly Griffith, female    DOB: 1955/07/06, 68 y.o.   MRN: [id number]  HPI  Patient is here for blood pressure check. Denies trouble sleeping, palpitations or medication problems.  Review of Systems     Objective:   Physical Exam        Assessment & Plan:   Patient advised to schedule follow up with provider in 3 months for hypertension.  Patient also advised to pick up Ear wash debrox kit (any cheap ear wash kit over the counter.)

## 2023-01-03 NOTE — Progress Notes (Signed)
Medical screening examination/treatment was performed by qualified clinical staff member and as supervising physician I was immediately available for consultation/collaboration. I have reviewed documentation and agree with assessment and plan.  Jennilyn Esteve S. Kimmberly Wisser, DO  

## 2023-01-14 ENCOUNTER — Encounter: Payer: Self-pay | Admitting: Emergency Medicine

## 2023-01-14 ENCOUNTER — Ambulatory Visit
Admission: EM | Admit: 2023-01-14 | Discharge: 2023-01-14 | Disposition: A | Discharge status: Home / Self Care | Payer: Medicare Other | Attending: Family Medicine | Admitting: Family Medicine

## 2023-01-14 DIAGNOSIS — R300 Dysuria: Secondary | ICD-10-CM | POA: Diagnosis present

## 2023-01-14 DIAGNOSIS — Z87440 Personal history of urinary (tract) infections: Secondary | ICD-10-CM | POA: Insufficient documentation

## 2023-01-14 DIAGNOSIS — N390 Urinary tract infection, site not specified: Secondary | ICD-10-CM

## 2023-01-14 LAB — POCT URINALYSIS DIP (MANUAL ENTRY)
Bilirubin, UA: NEGATIVE
Glucose, UA: NEGATIVE mg/dL
Ketones, POC UA: NEGATIVE mg/dL
Nitrite, UA: NEGATIVE
Protein Ur, POC: 100 mg/dL — AB
Spec Grav, UA: 1.025 (ref 1.010–1.025)
Urobilinogen, UA: 0.2 U/dL
pH, UA: 6 (ref 5.0–8.0)

## 2023-01-14 MED ORDER — CEPHALEXIN 500 MG PO CAPS: 500.0000 mg | ORAL_CAPSULE | Freq: Two times a day (BID) | ORAL | 0 refills | Status: DC

## 2023-01-14 NOTE — Discharge Instructions (Addendum)
Drink lots of water May take Azo for discomfort Take keflex 2 x a day   The urine will be sent to the lab for culture.  You will be called if any change in treatment is needed

## 2023-01-14 NOTE — ED Provider Notes (Signed)
[facility]    CSN: [id number] Arrival date & time: 01/14/23  1213      History   Chief Complaint Chief Complaint  Patient presents with   Possible UTI    HPI Kimberly Griffith is a 68 y.o. female.   HPI Patient states that she has a history of recurring urinary tract infections.  She currently has symptoms that she associates with infection.  Burning, frequency, inability to empty her bladder fully, pressure in her suprapubic area No kidney problems.  No fever or chills.  No flank pain Past Medical History:  Diagnosis Date   Allergy    kiwi   Arthritis 2022   hands   Blood transfusion without reported diagnosis 02/21/1991   Dx'd with E.Coli   Cancer (HCC) 02/21/2015   endometrial   Chronic kidney disease    Headache    History of kidney stones 02/20/2006   Hypertension 2017   Suspected   Neuromuscular disorder (HCC) 2020   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, hand   Pneumonia 02/20/2013   Tremor    left thumb    Patient Active Problem List   Diagnosis Date Noted   Candidiasis 12/20/2022   Screening for malignant neoplasm of skin 12/20/2022   Screening for colon cancer 12/20/2022   Primary hypertension 12/06/2022   Tremor 12/06/2022   Acute deep vein thrombosis (DVT) of popliteal vein of left lower extremity (HCC) 11/20/2022   Foot sprain, left, subsequent encounter 11/28/2019   Endometrial cancer (HCC) 07/05/2015    Past Surgical History:  Procedure Laterality Date   ABDOMINAL HYSTERECTOMY  2017   LYMPH NODE BIOPSY Bilateral 07/13/2015   Procedure: SENTINEL LYMPH NODE BIOPSY;  Surgeon: Laurette Schimke, MD;  Location: [facility];  Service: Gynecology;  Laterality: Bilateral;   ROBOTIC ASSISTED TOTAL HYSTERECTOMY WITH BILATERAL SALPINGO OOPHERECTOMY Bilateral 07/13/2015   Procedure: XI ROBOTIC ASSISTED LAPAROSCOPIC TOTAL HYSTERECTOMY WITH BILATERAL SALPINGO OOPHORECTOMY;  Surgeon: Laurette Schimke, MD;  Location: [facility];  Service: Gynecology;  Laterality: Bilateral;    TONSILLECTOMY AND ADENOIDECTOMY     WISDOM TOOTH EXTRACTION      OB History     Gravida  7   Para  6   Term      Preterm      AB  1   Living  6      SAB  1   IAB      Ectopic      Multiple      Live Births               Home Medications    Prior to Admission medications   Medication Sig Start Date End Date Taking? Authorizing Provider  apixaban (ELIQUIS) 5 MG TABS tablet Take 1 tablet (5 mg total) by mouth 2 (two) times daily. Start taking after completion of starter pack. 11/20/22  Yes Yates, Madison B, RPH-CPP  APIXABAN Everlene Balls) VTE STARTER PACK (10MG  AND 5MG ) Take as directed on package: start with two-5mg  tablets twice daily for 7 days. On day 8, switch to one-5mg  tablet twice daily. 11/12/22  Yes Glyn Ade, MD  cephALEXin (KEFLEX) 500 MG capsule Take 1 capsule (500 mg total) by mouth 2 (two) times daily. 01/14/23  Yes Eustace Moore, MD  hydrochlorothiazide (HYDRODIURIL) 25 MG tablet Take 1 tablet (25 mg total) by mouth daily. 12/20/22  Yes Morey Hummingbird S, DO  mupirocin 2% oint-hydrocortisone 2.5% cream-nystatin cream-zinc oxide 13% oint 1:1:1:5 mixture Apply topically 2 (two) times daily as needed. 12/20/22  Yes Morey Hummingbird S, DO  nystatin (MYCOSTATIN/NYSTOP) powder Apply 1 Application topically 3 (three) times daily. 12/20/22  Yes Morey Hummingbird S, DO  triamcinolone cream (KENALOG) 0.5 % Apply 1 Application topically 2 (two) times daily. To affected areas. 12/20/22  Yes Charlton Amor, DO    Family History Family History  Problem Relation Age of Onset   Hypertension Mother    Arthritis Mother    Stroke Mother    Varicose Veins Mother    Hypertension Maternal Grandmother    Stroke Maternal Grandmother    Stomach cancer Maternal Grandmother    Miscarriages / Stillbirths Maternal Grandmother    Vision loss Maternal Grandmother    Heart disease Paternal Grandfather    Other Father        dec unknown cause age 34   Hypertension Father     Prostate cancer Father    Hearing loss Father    Alcohol abuse Maternal Grandfather    Varicose Veins Maternal Grandfather    Heart disease Paternal Grandmother    Miscarriages / Stillbirths Paternal Grandmother    Varicose Veins Paternal Grandmother    Colon cancer Neg Hx    Rectal cancer Neg Hx    Esophageal cancer Neg Hx    Liver cancer Neg Hx     Social History Social History   Tobacco Use   Smoking status: Former    Current packs/day: 0.00    Average packs/day: 1.5 packs/day for 10.0 years (15.0 ttl pk-yrs)    Types: Cigarettes    Quit date: 07/04/1984    Years since quitting: 38.5   Smokeless tobacco: Former    Quit date: 02/21/1984   Tobacco comments:    I haven't smoked in 38 years  Vaping Use   Vaping status: Never Used  Substance Use Topics   Alcohol use: Yes    Alcohol/week: 1.0 standard drink of alcohol    Types: 1 Standard drinks or equivalent per week    Comment: Beer once a month in summer   Drug use: Never     Allergies   Kiwi extract and Amoxicillin   Review of Systems Review of Systems See HPI  Physical Exam Triage Vital Signs ED Triage Vitals  Encounter Vitals Group     BP 01/14/23 1224 (!) 163/98     Systolic BP Percentile --      Diastolic BP Percentile --      Pulse Rate 01/14/23 1224 70     Resp 01/14/23 1224 18     Temp 01/14/23 1224 98.2 F (36.8 C)     Temp Source 01/14/23 1224 Oral     SpO2 01/14/23 1224 97 %     Weight 01/14/23 1226 213 lb (96.6 kg)     Height 01/14/23 1226 5\' 3"  (1.6 m)     Head Circumference --      Peak Flow --      Pain Score 01/14/23 1226 7     Pain Loc --      Pain Education --      Exclude from Growth Chart --    No data found.  Updated Vital Signs BP (!) 163/98 (BP Location: Right Arm)   Pulse 70   Temp 98.2 F (36.8 C) (Oral)   Resp 18   Ht 5\' 3"  (1.6 m)   Wt 96.6 kg   LMP 02/21/2011 (Approximate)   SpO2 97%   BMI 37.73 kg/m      Physical Exam Constitutional:  General: She is  not in acute distress.    Appearance: She is well-developed. She is obese.  HENT:     Head: Normocephalic and atraumatic.  Eyes:     Conjunctiva/sclera: Conjunctivae normal.     Pupils: Pupils are equal, round, and reactive to light.  Cardiovascular:     Rate and Rhythm: Normal rate.  Pulmonary:     Effort: Pulmonary effort is normal. No respiratory distress.  Abdominal:     General: There is no distension.     Palpations: Abdomen is soft.     Tenderness: There is no right CVA tenderness or left CVA tenderness.  Musculoskeletal:        General: Normal range of motion.     Cervical back: Normal range of motion.     Right lower leg: Edema present.     Left lower leg: Edema present.  Skin:    General: Skin is warm and dry.  Neurological:     Mental Status: She is alert.      UC Treatments / Results  Labs (all labs ordered are listed, but only abnormal results are displayed) Labs Reviewed  POCT URINALYSIS DIP (MANUAL ENTRY) - Abnormal; Notable for the following components:      Result Value   Color, UA light yellow (*)    Clarity, UA cloudy (*)    Blood, UA small (*)    Protein Ur, POC =100 (*)    Leukocytes, UA Moderate (2+) (*)    All other components within normal limits  URINE CULTURE    EKG   Radiology No results found.  Procedures Procedures (including critical care time)  Medications Ordered in UC Medications - No data to display  Initial Impression / Assessment and Plan / UC Course  I have reviewed the triage vital signs and the nursing notes.  Pertinent labs & imaging results that were available during my care of the patient were reviewed by me and considered in my medical decision making (see chart for details).     Final Clinical Impressions(s) / UC Diagnoses   Final diagnoses:  Recurrent urinary tract infection  Dysuria     Discharge Instructions      Drink lots of water May take Azo for discomfort Take keflex 2 x a day   The urine  will be sent to the lab for culture.  You will be called if any change in treatment is needed   ED Prescriptions     Medication Sig Dispense Auth. Provider   cephALEXin (KEFLEX) 500 MG capsule Take 1 capsule (500 mg total) by mouth 2 (two) times daily. 14 capsule Eustace Moore, MD      PDMP not reviewed this encounter.   Eustace Moore, MD 01/14/23 1255

## 2023-01-14 NOTE — ED Triage Notes (Signed)
Patient c/o possible UTI, urgency, frequency and dysuria x 1 week.  Patient has taken AZO.  Patient's urine is cloudy.

## 2023-01-16 LAB — URINE CULTURE: Culture: 80000 — AB

## 2023-01-31 ENCOUNTER — Encounter (HOSPITAL_COMMUNITY): Payer: Self-pay

## 2023-01-31 ENCOUNTER — Ambulatory Visit (HOSPITAL_COMMUNITY)
Admission: RE | Admit: 2023-01-31 | Discharge: 2023-01-31 | Disposition: A | Discharge status: Home / Self Care | Payer: Medicare Other | Source: Ambulatory Visit | Attending: Surgery | Admitting: Surgery

## 2023-01-31 VITALS — BP 146/74 | HR 62

## 2023-01-31 DIAGNOSIS — I82432 Acute embolism and thrombosis of left popliteal vein: Secondary | ICD-10-CM

## 2023-01-31 NOTE — Patient Instructions (Signed)
You have been discharged from the DVT Clinic! No further follow up in the DVT Clinic is needed.  -Continue Eliquis until you have completed your current supply. This will complete 3 months of treatment.  -You can start taking a low dose aspirin 81 mg daily after you have completed Eliquis.  -It is important to take your medication around the same time every day.  -Avoid NSAIDs like ibuprofen (Advil, Motrin) and naproxen (Aleve) as well as aspirin doses over 100 mg daily. -Tylenol (acetaminophen) is the preferred over the counter pain medication to lower the risk of bleeding. -Be sure to alert all of your health care providers that you are taking an anticoagulant prior to starting a new medication or having a procedure. -Monitor for signs and symptoms of bleeding (abnormal bruising, prolonged bleeding, nose bleeds, bleeding from gums, discolored urine, black tarry stools). If you have fallen and hit your head OR if your bleeding is severe or not stopping, seek emergency care.  -Go to the emergency room if emergent signs and symptoms of new clot occur (new or worse swelling and pain in an arm or leg, shortness of breath, chest pain, fast or irregular heartbeats, lightheadedness, dizziness, fainting, coughing up blood) or if you experience a significant color change (pale or blue) in the extremity that has the DVT.  -If you are having an emergency, call 911 or present to the nearest emergency room.   Please reach out if any questions come up. [phone] [facility].

## 2023-01-31 NOTE — Progress Notes (Cosign Needed)
DVT Clinic Note  Name: Kimberly Griffith     MRN: [id number]     DOB: 1955/05/13     Sex: female  PCP: Charlton Amor, DO  Today's Visit: Visit Information: Discharge Visit  Referred to DVT Clinic by: Emergency Department - Dr. Doran Durand Referred to CPP by: Dr. Myra Gianotti Reason for referral:  Chief Complaint  Patient presents with   Med Management - DVT   HISTORY OF PRESENT ILLNESS: Kimberly Griffith is a 67 y.o. female with PMH CKD, endometrial cancer s/p hysterectomy, who presents for follow up medication management for DVT. She presented to the ED 11/12/22 with LLE pain, redness, and swelling for a few days. She had recently returned from a cruise in [place]. No prior history of VTE but did have thrombophlebitis during prior pregnancy. Has varicose veins bilaterally. Ultrasound showed nonocclusive DVT involving the tibioperoneal trunk and extending into the popliteal vein as well as thrombosed varicose veins. She was started on Eliquis. Last seen in DVT Clinic 11/20/22 at which time she told me that they flew to [place] then got on the cruise ship and cruised around [place] for 10 days. In the first couple days of the trip, she got COVID and took Paxlovid. She did not have a PCP so I helped her make an appointment with one at [facility].   Today patient reports that her leg is feeling better. She did recently have a UTI and at the same time had some redness around her left leg that cleared up with cephalexin. Denies abnormal bleeding or bruising. Reports only 1 missed dose of Eliquis in the past few months. She tried wearing compression stockings but has not been able to find a comfortable pair despite going up in size.She has established with her new PCP and now her BP is under much better control.   Positive Thrombotic Risk Factors: Sedentary journey lasting >8 hours within 4 weeks, Recent COVID diagnosis (within 3 months) Bleeding Risk Factors: Age >65 years, Anticoagulant therapy  Negative Thrombotic Risk  Factors: Previous VTE, Recent surgery (within 3 months), Recent trauma (within 3 months), Recent admission to hospital with acute illness (within 3 months), Paralysis, paresis, or recent plaster cast immobilization of lower extremity, Central venous catheterization, Bed rest >72 hours within 3 months, Pregnancy, Within 6 weeks postpartum, Recent cesarean section (within 3 months), Estrogen therapy, Testosterone therapy, Erythropoiesis-stimulating agent, Active cancer, Non-malignant, chronic inflammatory condition, Known thrombophilic condition, Smoking, Older age, Obesity  Rx Insurance Coverage:  Commercial Medicare Rx Affordability: [place] for Eliquis approved under her [place] plan. Would cost $82 on her insurance. Activated copay card to fill for $10/month.  Rx Assistance Provided: Co-pay card Preferred Pharmacy: [facility]  Past Medical History:  Diagnosis Date   Allergy    kiwi   Arthritis 2022   hands   Blood transfusion without reported diagnosis 02/21/1991   Dx'd with E.Coli   Cancer (HCC) 02/21/2015   endometrial   Chronic kidney disease    Headache    History of kidney stones 02/20/2006   Hypertension 2017   Suspected   Neuromuscular disorder (HCC) 2020   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, hand   Pneumonia 02/20/2013   Tremor    left thumb    Past Surgical History:  Procedure Laterality Date   ABDOMINAL HYSTERECTOMY  2017   LYMPH NODE BIOPSY Bilateral 07/13/2015   Procedure: SENTINEL LYMPH NODE BIOPSY;  Surgeon: Laurette Schimke, MD;  Location: [facility];  Service: Gynecology;  Laterality: Bilateral;   ROBOTIC  ASSISTED TOTAL HYSTERECTOMY WITH BILATERAL SALPINGO OOPHERECTOMY Bilateral 07/13/2015   Procedure: XI ROBOTIC ASSISTED LAPAROSCOPIC TOTAL HYSTERECTOMY WITH BILATERAL SALPINGO OOPHORECTOMY;  Surgeon: Laurette Schimke, MD;  Location: [facility];  Service: Gynecology;  Laterality: Bilateral;   TONSILLECTOMY AND ADENOIDECTOMY     WISDOM TOOTH EXTRACTION      Social History    Socioeconomic History   Marital status: Married    Spouse name: Not on file   Number of children: 6   Years of education: Not on file   Highest education level: Not on file  Occupational History   Occupation: Emergency planning/management officer  Tobacco Use   Smoking status: Former    Current packs/day: 0.00    Average packs/day: 1.5 packs/day for 10.0 years (15.0 ttl pk-yrs)    Types: Cigarettes    Quit date: 07/04/1984    Years since quitting: 38.6   Smokeless tobacco: Former    Quit date: 02/21/1984   Tobacco comments:    I haven't smoked in 38 years  Vaping Use   Vaping status: Never Used  Substance and Sexual Activity   Alcohol use: Yes    Alcohol/week: 1.0 standard drink of alcohol    Types: 1 Standard drinks or equivalent per week    Comment: Beer once a month in summer   Drug use: Never   Sexual activity: Yes    Partners: Male    Birth control/protection: Post-menopausal, Surgical  Other Topics Concern   Not on file  Social History Narrative   Not on file   Social Determinants of Health   Financial Resource Strain: Not on file  Food Insecurity: Not on file  Transportation Needs: Not on file  Physical Activity: Not on file  Stress: Not on file  Social Connections: Not on file  Intimate Partner Violence: Not on file    Family History  Problem Relation Age of Onset   Hypertension Mother    Arthritis Mother    Stroke Mother    Varicose Veins Mother    Hypertension Maternal Grandmother    Stroke Maternal Grandmother    Stomach cancer Maternal Grandmother    Miscarriages / Stillbirths Maternal Grandmother    Vision loss Maternal Grandmother    Heart disease Paternal Grandfather    Other Father        dec unknown cause age 31   Hypertension Father    Prostate cancer Father    Hearing loss Father    Alcohol abuse Maternal Grandfather    Varicose Veins Maternal Grandfather    Heart disease Paternal Grandmother    Miscarriages / Stillbirths Paternal Grandmother     Varicose Veins Paternal Grandmother    Colon cancer Neg Hx    Rectal cancer Neg Hx    Esophageal cancer Neg Hx    Liver cancer Neg Hx     Allergies as of 01/31/2023 - Review Complete 01/31/2023  Allergen Reaction Noted   Kiwi extract Swelling and Other (See Comments) 07/07/2015   Amoxicillin Diarrhea and Nausea And Vomiting 11/12/2022    Current Outpatient Medications on File Prior to Encounter  Medication Sig Dispense Refill   apixaban (ELIQUIS) 5 MG TABS tablet Take 1 tablet (5 mg total) by mouth 2 (two) times daily. Start taking after completion of starter pack. 120 tablet 0   hydrochlorothiazide (HYDRODIURIL) 25 MG tablet Take 1 tablet (25 mg total) by mouth daily. 30 tablet 3   Multiple Vitamins-Minerals (CENTRUM ADULT PO) Take by mouth.     mupirocin 2% oint-hydrocortisone 2.5%  cream-nystatin cream-zinc oxide 13% oint 1:1:1:5 mixture Apply topically 2 (two) times daily as needed. 120 g 2   nystatin (MYCOSTATIN/NYSTOP) powder Apply 1 Application topically 3 (three) times daily. 15 g 0   triamcinolone cream (KENALOG) 0.5 % Apply 1 Application topically 2 (two) times daily. To affected areas. 30 g 3   No current facility-administered medications on file prior to encounter.   REVIEW OF SYSTEMS:  Review of Systems  Respiratory:  Negative for shortness of breath.   Cardiovascular:  Positive for leg swelling. Negative for chest pain and palpitations.  Musculoskeletal:  Negative for myalgias.  Neurological:  Negative for dizziness and tingling.   PHYSICAL EXAMINATION:  Vitals:   01/31/23 1439  BP: (!) 146/74  Pulse: 62  SpO2: 100%   Physical Exam Vitals reviewed.  Cardiovascular:     Rate and Rhythm: Normal rate.  Pulmonary:     Effort: Pulmonary effort is normal.  Musculoskeletal:        General: No tenderness.     Right lower leg: Edema present.     Left lower leg: Edema present.  Skin:    Findings: No bruising or erythema.     Comments: Varicose veins bilaterally   Psychiatric:        Mood and Affect: Mood normal.        Behavior: Behavior normal.        Thought Content: Thought content normal.   Villalta Score for Post-Thrombotic Syndrome: Pain: Mild Cramps: Absent Heaviness: Absent Paresthesia: Absent Pruritus: Absent Pretibial Edema: Moderate Skin Induration: Absent Hyperpigmentation: Absent Redness: Absent Venous Ectasia: Mild Pain on calf compression: Absent Villalta Preliminary Score: 4 Is venous ulcer present?: No If venous ulcer is present and score is <15, then 15 points total are assigned: Absent Villalta Total Score: 4  LABS:  CBC     Component Value Date/Time   WBC 6.5 11/12/2022 1333   RBC 4.47 11/12/2022 1333   HGB 13.3 11/12/2022 1333   HCT 40.2 11/12/2022 1333   PLT 244 11/12/2022 1333   MCV 89.9 11/12/2022 1333   MCV 86.3 10/19/2013 1135   MCH 29.8 11/12/2022 1333   MCHC 33.1 11/12/2022 1333   RDW 13.5 11/12/2022 1333   LYMPHSABS 2.0 11/12/2022 1333   MONOABS 0.6 11/12/2022 1333   EOSABS 0.2 11/12/2022 1333   BASOSABS 0.1 11/12/2022 1333    Hepatic Function      Component Value Date/Time   PROT 7.3 07/09/2015 1100   ALBUMIN 4.2 07/09/2015 1100   AST 24 07/09/2015 1100   ALT 22 07/09/2015 1100   ALKPHOS 72 07/09/2015 1100   BILITOT 0.7 07/09/2015 1100    Renal Function   Lab Results  Component Value Date   CREATININE 0.50 11/12/2022   CREATININE 0.72 11/26/2015   CREATININE 0.58 07/14/2015    CrCl cannot be calculated (Patient's most recent lab result is older than the maximum 21 days allowed.).   [facility] Vascular Lab Studies:  11/12/22 doppler IMPRESSION: 1. Partially occlusive thrombus within the tibioperoneal trunk and gastrocnemius vein in the upper calf extending into the distal left popliteal vein. 2. Underlying the palpable area of clinical concern are thrombosed varicose veins.  ASSESSMENT: Location of DVT: Left popliteal vein, Left distal vein Cause of DVT: provoked by a transient risk  factor - prolonged travel to [place] as well as COVID infection on that trip, immediately preceding symptom onset. We have planned to anticoagulate for a first provoked DVT for a total of 3 months, which she  is nearing the end of. She has tolerated Eliquis well and had no issues with medication access due to the copay cards provided to her at her last visit. Symptoms have improved, and I have encouraged her to continue elevation and try to find comfortable compression stockings to help with bilateral edema. She has a history of thrombophlebitis prior to this DVT and has varicose veins in both legs. Would be reasonable to start low dose aspirin after Eliquis treatment is complete.   PLAN: -Patient is discharged from the DVT Clinic. -Continue Eliquis to complete 3 months of treatment (started on 11/12/22) then discontinue. No need for repeat imaging. Would be reasonable to start aspirin 81 mg daily after she has stopped Eliquis.  -Counseled patient on future VTE risk reduction strategies and to inform all future providers of DVT history.  Follow up: with PCP. No further follow up needed in DVT Clinic at this time.   Pervis Hocking, PharmD, [place], CPP [facility]romb[facility] Clinical Pharmacist Practitioner Office: [phone]  I have evaluated the patient's chart/imaging and refer this patient to the Clinical Pharmacist Practitioner for medication management. I have reviewed the CPP's documentation and agree with her assessment and plan. I was immediately available during the visit for questions and collaboration.   Durene Cal, MD

## 2023-02-08 ENCOUNTER — Ambulatory Visit: Payer: Medicare Other | Admitting: Family Medicine

## 2023-02-08 ENCOUNTER — Encounter: Payer: Self-pay | Admitting: Family Medicine

## 2023-02-08 VITALS — BP 146/72 | HR 61 | Ht 63.0 in | Wt 215.0 lb

## 2023-02-08 DIAGNOSIS — R300 Dysuria: Secondary | ICD-10-CM | POA: Diagnosis not present

## 2023-02-08 DIAGNOSIS — N390 Urinary tract infection, site not specified: Secondary | ICD-10-CM

## 2023-02-08 DIAGNOSIS — R8290 Unspecified abnormal findings in urine: Secondary | ICD-10-CM | POA: Diagnosis not present

## 2023-02-08 DIAGNOSIS — N3000 Acute cystitis without hematuria: Secondary | ICD-10-CM

## 2023-02-08 LAB — POCT URINALYSIS DIP (CLINITEK)
Bilirubin, UA: NEGATIVE
Blood, UA: NEGATIVE
Glucose, UA: NEGATIVE mg/dL
Ketones, POC UA: NEGATIVE mg/dL
Leukocytes, UA: NEGATIVE
Nitrite, UA: NEGATIVE
POC PROTEIN,UA: NEGATIVE
Spec Grav, UA: >=1.03 — AB (ref 1.010–1.025)
Urobilinogen, UA: 0.2 U/dL
pH, UA: 6.5 (ref 5.0–8.0)

## 2023-02-08 MED ORDER — PHENAZOPYRIDINE HCL 200 MG PO TABS: 200.0000 mg | ORAL_TABLET | Freq: Three times a day (TID) | ORAL | 0 refills | Status: AC | PRN

## 2023-02-08 MED ORDER — NITROFURANTOIN MONOHYD MACRO 100 MG PO CAPS: 100.0000 mg | ORAL_CAPSULE | Freq: Two times a day (BID) | ORAL | 0 refills | Status: AC

## 2023-02-08 NOTE — Assessment & Plan Note (Signed)
Will go ahead and refer to urogyn for further investigation of recurrent UTIs pt also feels like her vagina is falling out and I am curious if this is related to possible prolapse

## 2023-02-08 NOTE — Assessment & Plan Note (Addendum)
Poct ua positive for leuks and nitrites  - will send in macrobid and culture - gave precautions of pyelonephritis signs and pt is aware and denies any fevers or back pain

## 2023-02-08 NOTE — Progress Notes (Signed)
Established patient visit   Patient: Kimberly Griffith   DOB: [birth year]   67 y.o. Female  MRN: [id number] Visit Date: 02/08/2023  Today's healthcare provider: Charlton Amor, DO   Chief Complaint  Patient presents with   Urinary Tract Infection    Pt states she has a sweet odor to her urine, urine is cloudy     SUBJECTIVE    Chief Complaint  Patient presents with   Urinary Tract Infection    Pt states she has a sweet odor to her urine, urine is cloudy    HPI HPI     Urinary Tract Infection    Additional comments: Pt states she has a sweet odor to her urine, urine is cloudy       Last edited by Roselyn Reef, CMA on 02/08/2023  2:00 PM.       Pt presents with concerns of dysuria and frequency of urination. Was seen in urgent care about a month ago and given keflex for a uti.  Pt also notes feeling like her "vagina is going to fall out."  Review of Systems  Constitutional:  Negative for activity change, fatigue and fever.  Respiratory:  Negative for cough and shortness of breath.   Cardiovascular:  Negative for chest pain.  Gastrointestinal:  Negative for abdominal pain.  Genitourinary:  Positive for dysuria and frequency. Negative for difficulty urinating.       Current Meds  Medication Sig   apixaban (ELIQUIS) 5 MG TABS tablet Take 1 tablet (5 mg total) by mouth 2 (two) times daily. Start taking after completion of starter pack.   hydrochlorothiazide (HYDRODIURIL) 25 MG tablet Take 1 tablet (25 mg total) by mouth daily.   Multiple Vitamins-Minerals (CENTRUM ADULT PO) Take by mouth.   mupirocin 2% oint-hydrocortisone 2.5% cream-nystatin cream-zinc oxide 13% oint 1:1:1:5 mixture Apply topically 2 (two) times daily as needed.   nitrofurantoin, macrocrystal-monohydrate, (MACROBID) 100 MG capsule Take 1 capsule (100 mg total) by mouth 2 (two) times daily for 5 days.   nystatin (MYCOSTATIN/NYSTOP) powder Apply 1 Application topically 3 (three) times daily.    phenazopyridine (PYRIDIUM) 200 MG tablet Take 1 tablet (200 mg total) by mouth 3 (three) times daily as needed for pain.   triamcinolone cream (KENALOG) 0.5 % Apply 1 Application topically 2 (two) times daily. To affected areas.    OBJECTIVE    BP (!) 146/72 (BP Location: Left Arm, Patient Position: Sitting, Cuff Size: Large)   Pulse 61   Ht 5\' 3"  (1.6 m)   Wt 215 lb (97.5 kg)   LMP 02/21/2011 (Approximate)   SpO2 100%   BMI 38.09 kg/m   Physical Exam Vitals and nursing note reviewed.  Constitutional:      General: She is not in acute distress.    Appearance: Normal appearance.  HENT:     Head: Normocephalic and atraumatic.     Right Ear: External ear normal.     Left Ear: External ear normal.     Nose: Nose normal.  Eyes:     Conjunctiva/sclera: Conjunctivae normal.  Cardiovascular:     Rate and Rhythm: Normal rate and regular rhythm.  Pulmonary:     Effort: Pulmonary effort is normal.     Breath sounds: Normal breath sounds.  Neurological:     General: No focal deficit present.     Mental Status: She is alert and oriented to person, place, and time.  Psychiatric:        Mood and  Affect: Mood normal.        Behavior: Behavior normal.        Thought Content: Thought content normal.        Judgment: Judgment normal.        ASSESSMENT & PLAN    Problem List Items Addressed This Visit       Genitourinary   Recurrent UTI   Will go ahead and refer to urogyn for further investigation of recurrent UTIs pt also feels like her vagina is falling out and I am curious if this is related to possible prolapse      Relevant Medications   phenazopyridine (PYRIDIUM) 200 MG tablet   nitrofurantoin, macrocrystal-monohydrate, (MACROBID) 100 MG capsule   Other Relevant Orders   POCT URINALYSIS DIP (CLINITEK) (Completed)   Ambulatory referral to Urogynecology   Urine Culture   Acute cystitis without hematuria   Poct ua positive for leuks and nitrites  - will send in macrobid  and culture - gave precautions of pyelonephritis signs and pt is aware and denies any fevers or back pain      Other Visit Diagnoses       Dysuria    -  Primary   Relevant Orders   POCT URINALYSIS DIP (CLINITEK) (Completed)   Urine Culture     Malodorous urine           No follow-ups on file.      Meds ordered this encounter  Medications   phenazopyridine (PYRIDIUM) 200 MG tablet    Sig: Take 1 tablet (200 mg total) by mouth 3 (three) times daily as needed for pain.    Dispense:  10 tablet    Refill:  0   nitrofurantoin, macrocrystal-monohydrate, (MACROBID) 100 MG capsule    Sig: Take 1 capsule (100 mg total) by mouth 2 (two) times daily for 5 days.    Dispense:  10 capsule    Refill:  0    Orders Placed This Encounter  Procedures   Urine Culture   Ambulatory referral to Urogynecology    Referral Priority:   Routine    Referral Type:   Consultation    Referral Reason:   Specialty Services Required    Requested Specialty:   Urology    Number of Visits Requested:   1   POCT URINALYSIS DIP (CLINITEK)     Charlton Amor, DO  [facility] at [facility] [phone] (phone) [phone] (fax)  [facility]

## 2023-02-13 LAB — URINE CULTURE

## 2023-03-02 ENCOUNTER — Ambulatory Visit (AMBULATORY_SURGERY_CENTER): Payer: Medicare Other | Admitting: *Deleted

## 2023-03-02 VITALS — Ht 63.0 in | Wt 215.0 lb

## 2023-03-02 DIAGNOSIS — Z860100 Personal history of colon polyps, unspecified: Secondary | ICD-10-CM

## 2023-03-02 MED ORDER — NA SULFATE-K SULFATE-MG SULF 17.5-3.13-1.6 GM/177ML PO SOLN: 1.0000 | Freq: Once | ORAL | 0 refills | Status: AC

## 2023-03-02 NOTE — Progress Notes (Signed)
 Pt's name and DOB verified at the beginning of the pre-visit wit 2 identifiers  Pt denies any difficulty with ambulating,sitting, laying down or rolling side to side  Pt has no issues with ambulation   Pt has no issues moving head neck or swallowing  No egg or soy allergy known to patient   No issues known to pt with past sedation with any surgeries or procedures  Pt denies having issues being intubated  No FH of Malignant Hyperthermia  Pt is not on diet pills or shots  Pt is not on home 02   Pt is not on blood thinners   Pt denies issues with constipation   Pt is not on dialysis  Pt denise any abnormal heart rhythms   Pt denies any upcoming cardiac testing  Pt encouraged to use to use Singlecare or Goodrx to reduce cost   Patient's chart reviewed by Cathlyn Parsons CNRA prior to pre-visit and patient appropriate for the [facility].  Pre-visit completed and red dot placed by patient's name on their procedure day (on provider's schedule).  .  Visit by phone  Pt states weight is 215 lb  Instructed pt why it is important to and  to call if they have any changes in health or new medications. Directed them to the # given and on instructions.     Instructions reviewed. Pt given both [facility] main # and MD on call # prior to instructions.  Pt states understanding. Instructed to review again prior to procedure. Pt states they will.   Instructions sent by mail and by My Chart  Coupon sent via text to mobile phone and pt verified they received it

## 2023-03-22 ENCOUNTER — Ambulatory Visit (INDEPENDENT_AMBULATORY_CARE_PROVIDER_SITE_OTHER): Payer: Medicare Other | Admitting: Neurology

## 2023-03-22 ENCOUNTER — Encounter: Payer: Self-pay | Admitting: Neurology

## 2023-03-22 VITALS — BP 142/78 | HR 89

## 2023-03-22 DIAGNOSIS — R251 Tremor, unspecified: Secondary | ICD-10-CM | POA: Diagnosis not present

## 2023-03-22 NOTE — Progress Notes (Signed)
Chief Complaint  Patient presents with   Tremors    Rm 15 with spouse  Pt is well, reports she started having L hand tremors about 4-5 years. Symptoms of have progressed overtime.       ASSESSMENT AND PLAN  Kimberly Griffith is a 68 y.o. 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   MEDICAL HISTORY:  Kimberly Griffith is a 68 year old right-handed female, [facility] Dr. Tamera Punt, [place] for evaluation of tremor she is accompanied by her husband at today's visit March 22, 2023  History is obtained from the patient and review of electronic medical records. I personally reviewed pertinent available imaging films in PACS.   PMHx of  HTN Endometrial Cancer in 2017, hysterectomy,   She works a Health and safety inspector job, fairly sedentary, around 2021 she noticed intermittent left hand tremor, especially when she banged her left thumb, gradually getting worse, from intermittent to be more constant, but there was no limitation in her daily activity, no weakness, she denies significant neck pain,  She denied loss sense of smell, over the past couple years, she have excessive movement and leg jerking in her sleep, she also described urge to move her leg before falling to sleep, with some restless leg symptoms, but was not treated  She denies gait abnormality,   There was no family history of tremor  PHYSICAL EXAM:   Vitals:   03/22/23 1343  BP: (!) 142/78  Pulse: 89     There is no height or weight on file to calculate BMI.  PHYSICAL EXAMNIATION:  Gen: NAD, conversant, well  nourised, well groomed                     Cardiovascular: Regular rate rhythm, no peripheral edema, warm, nontender. Eyes: Conjunctivae clear without exudates or hemorrhage Neck: Supple, no carotid bruits. Pulmonary: Clear to auscultation bilaterally   NEUROLOGICAL EXAM:  MENTAL STATUS: Speech/cognition: Awake, alert, oriented to history taking and casual conversation CRANIAL NERVES: CN II: Visual fields are full to confrontation. Pupils are round equal and briskly reactive to light. CN III, IV, VI: extraocular movement are normal. No ptosis. CN V: Facial sensation is intact to light touch CN VII: Face is symmetric with normal eye closure  CN VIII: Hearing is normal to causal conversation. CN IX, X: Phonation is normal. CN XI: Head turning and shoulder shrug are intact  MOTOR: Mild left hand tremor, triggered by finger flexion, no weakness, no significant bradykinesia rigidity,  REFLEXES: Reflexes are 2+ and symmetric at the biceps, triceps, knees, and ankles. Plantar responses are flexor.  SENSORY: Intact to light touch, pinprick and vibratory sensation are intact in fingers and toes.  COORDINATION: There is no trunk or limb dysmetria noted.  GAIT/STANCE: Able to get up from seated position arm crossed, gait is steady with normal steps, base, arm swing, and turning. Heel and toe walking are normal. Tandem gait is normal.  Romberg is absent.  REVIEW OF SYSTEMS:  Full 14 system review of systems performed and notable only for as above All other review of systems were negative.  ALLERGIES: Allergies  Allergen Reactions   Kiwi Extract Swelling and Other (See Comments)    Tongue really swollen   Amoxicillin Diarrhea and Nausea And Vomiting    HOME MEDICATIONS: Current Outpatient Medications  Medication Sig Dispense Refill   Cholecalciferol (D3) 25 MCG (1000 UT) capsule Take 1,000 Units by mouth daily.     Cranberry 500 MG CAPS Take by mouth.     hydrochlorothiazide  (HYDRODIURIL) 25 MG tablet Take 1 tablet (25 mg total) by mouth daily. 30 tablet 3   Lactase 9000 units TABS Take by mouth.     Multiple Vitamin (MULTIVITAMIN) capsule Take 1 capsule by mouth daily. Over 50     Multiple Vitamins-Minerals (CENTRUM ADULT PO) Take by mouth.     mupirocin 2% oint-hydrocortisone 2.5% cream-nystatin cream-zinc oxide 13% oint 1:1:1:5 mixture Apply topically 2 (two) times daily as needed. 120 g 2   nystatin (MYCOSTATIN/NYSTOP) powder Apply 1 Application topically 3 (three) times daily. 15 g 0   phenazopyridine (PYRIDIUM) 200 MG tablet Take 1 tablet (200 mg total) by mouth 3 (three) times daily as needed for pain. 10 tablet 0   triamcinolone cream (KENALOG) 0.5 % Apply 1 Application topically 2 (two) times daily. To affected areas. 30 g 3   No current facility-administered medications for this visit.    PAST MEDICAL HISTORY: Past Medical History:  Diagnosis Date   Allergy    kiwi   Arthritis 2022   hands   Blood transfusion without reported diagnosis 02/21/1991   Dx'd with E.Coli   Cancer (HCC) 02/21/2015   endometrial   Chronic kidney disease    Clotting disorder (HCC)    Headache    History of kidney stones 02/20/2006   Hypertension 2017   Suspected   Neuromuscular disorder (HCC) 2020   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, hand   Oxygen deficiency    Pneumonia 02/20/2013   Tremor    left thumb    PAST SURGICAL HISTORY: Past Surgical History:  Procedure Laterality Date   ABDOMINAL HYSTERECTOMY  2017   COLONOSCOPY     LYMPH NODE BIOPSY Bilateral 07/13/2015   Procedure: SENTINEL LYMPH NODE BIOPSY;  Surgeon: Laurette Schimke, MD;  Location: [facility];  Service: Gynecology;  Laterality: Bilateral;   ROBOTIC ASSISTED TOTAL HYSTERECTOMY WITH BILATERAL SALPINGO OOPHERECTOMY Bilateral 07/13/2015   Procedure: XI ROBOTIC ASSISTED LAPAROSCOPIC TOTAL HYSTERECTOMY WITH BILATERAL SALPINGO OOPHORECTOMY;  Surgeon: Laurette Schimke, MD;  Location: [facility];  Service: Gynecology;   Laterality: Bilateral;   TONSILLECTOMY AND ADENOIDECTOMY     WISDOM TOOTH EXTRACTION      FAMILY HISTORY: Family History  Problem Relation Age of Onset   Hypertension Mother    Arthritis Mother    Stroke Mother    Varicose Veins Mother    Other Father        dec unknown cause age 59   Hypertension Father    Prostate cancer Father    Hearing loss Father    Hypertension Maternal Grandmother    Stroke Maternal Grandmother    Stomach cancer Maternal Grandmother    Miscarriages / Stillbirths Maternal Grandmother    Vision loss Maternal Grandmother    Alcohol abuse Maternal Grandfather    Varicose Veins Maternal Grandfather    Heart disease Paternal Grandmother    Miscarriages / Stillbirths Paternal Grandmother    Varicose Veins Paternal Grandmother    Heart disease Paternal Grandfather    Colon cancer Neg Hx    Rectal cancer Neg Hx    Esophageal  cancer Neg Hx    Liver cancer Neg Hx    Colon polyps Neg Hx     SOCIAL HISTORY: Social History   Socioeconomic History   Marital status: Married    Spouse name: Not on file   Number of children: 6   Years of education: Not on file   Highest education level: Not on file  Occupational History   Occupation: Emergency planning/management officer  Tobacco Use   Smoking status: Former    Current packs/day: 0.00    Average packs/day: 1.5 packs/day for 10.0 years (15.0 ttl pk-yrs)    Types: Cigarettes    Quit date: 07/04/1984    Years since quitting: 38.7   Smokeless tobacco: Former    Quit date: 02/21/1984   Tobacco comments:    I haven't smoked in 38 years  Vaping Use   Vaping status: Never Used  Substance and Sexual Activity   Alcohol use: Yes    Alcohol/week: 1.0 standard drink of alcohol    Types: 1 Standard drinks or equivalent per week    Comment: Beer once a month in summer   Drug use: Never   Sexual activity: Yes    Partners: Male    Birth control/protection: Post-menopausal, Surgical  Other Topics Concern   Not on file  Social  History Narrative   Not on file   Social Drivers of Health   Financial Resource Strain: Not on file  Food Insecurity: Not on file  Transportation Needs: Not on file  Physical Activity: Not on file  Stress: Not on file  Social Connections: Not on file  Intimate Partner Violence: Not on file      Levert Feinstein, M.D. Ph.D.  [facility] [address] [place], [place] [address] Ph: [phone] Fax: [phone]  CC:  Charlton Amor, DO [address], [address] [place],  [place] [address]  Charlton Amor, DO

## 2023-03-23 ENCOUNTER — Telehealth: Payer: Self-pay | Admitting: Neurology

## 2023-03-23 LAB — COMPREHENSIVE METABOLIC PANEL
ALT: 30 [IU]/L (ref 0–32)
AST: 26 [IU]/L (ref 0–40)
Albumin: 4.4 g/dL (ref 3.9–4.9)
Alkaline Phosphatase: 90 [IU]/L (ref 44–121)
BUN/Creatinine Ratio: 25 (ref 12–28)
BUN: 16 mg/dL (ref 8–27)
Bilirubin Total: 0.7 mg/dL (ref 0.0–1.2)
CO2: 24 mmol/L (ref 20–29)
Calcium: 9.9 mg/dL (ref 8.7–10.3)
Chloride: 102 mmol/L (ref 96–106)
Creatinine, Ser: 0.65 mg/dL (ref 0.57–1.00)
Globulin, Total: 2.7 g/dL (ref 1.5–4.5)
Glucose: 90 mg/dL (ref 70–99)
Potassium: 3.7 mmol/L (ref 3.5–5.2)
Sodium: 142 mmol/L (ref 134–144)
Total Protein: 7.1 g/dL (ref 6.0–8.5)
eGFR: 96 mL/min/{1.73_m2} (ref >59)

## 2023-03-23 LAB — VITAMIN B12: Vitamin B-12: 627 pg/mL (ref 232–1245)

## 2023-03-23 LAB — TSH: TSH: 12.3 u[IU]/mL — ABNORMAL HIGH (ref 0.450–4.500)

## 2023-03-23 NOTE — Telephone Encounter (Signed)
Please call patient, laboratory evaluation was abnormal, with significantly elevated TSH, indicating decreased thyroid functional test  I have forwarded result to her primary care Charlton Amor, DO she should contact him for further evaluation of her abnormal thyroid functional test

## 2023-03-26 ENCOUNTER — Telehealth: Payer: Self-pay | Admitting: Gastroenterology

## 2023-03-26 NOTE — Telephone Encounter (Signed)
Called and spoke with patient regarding recommendations below. Pt verbalized understanding and had no concerns at the end of the call.

## 2023-03-26 NOTE — Telephone Encounter (Signed)
Called and relayed msg to pts husband who voiced gratitude and understanding

## 2023-03-26 NOTE — Telephone Encounter (Signed)
Returned call to patient. Pt reports that she is sick with a cough and low grade fever, symptoms started Saturday. Something is going around her workplace. Pt has not tested for Flu or Covid. Pt states that her highest temp was 100.3, temp this morning was 99. Pt has some body aches and a cough. Pt states that she feels "horrible". Pt really does not want to cancel colonoscopy on 03/30/23. I told pt that we can keep it scheduled for now, not sure if her fever will break prior to then. She is taking Tylenol. I advised that if she continues with fever and cough we will need to reschedule, but I will check with provider to confirm before rescheduling.

## 2023-03-26 NOTE — Telephone Encounter (Signed)
If she feels better over the next 48 hours then I think would be okay to proceed.  If she remains febrile however and getting worse then we should probably cancel it.  Lets see how things go over the next 1 to 2 days.  She can contact us if she feels that she cannot proceed with colonoscopy.  She should see her primary care for further evaluation of this.  If she is getting worse I would asked that she contact us ASAP so we can schedule another patient in that spot.  Thanks

## 2023-03-26 NOTE — Telephone Encounter (Signed)
Patient called stating she is sick and having further questions for procedure on 03/30/2023 at 10:30AM

## 2023-03-27 ENCOUNTER — Telehealth: Payer: Self-pay | Admitting: Neurology

## 2023-03-27 ENCOUNTER — Other Ambulatory Visit: Payer: Self-pay

## 2023-03-27 ENCOUNTER — Ambulatory Visit
Admission: EM | Admit: 2023-03-27 | Discharge: 2023-03-27 | Disposition: A | Discharge status: Home / Self Care | Payer: Medicare Other | Attending: Family Medicine | Admitting: Family Medicine

## 2023-03-27 DIAGNOSIS — R059 Cough, unspecified: Secondary | ICD-10-CM | POA: Diagnosis not present

## 2023-03-27 DIAGNOSIS — J111 Influenza due to unidentified influenza virus with other respiratory manifestations: Secondary | ICD-10-CM

## 2023-03-27 LAB — POCT INFLUENZA A/B
Influenza A, POC: NEGATIVE
Influenza B, POC: NEGATIVE

## 2023-03-27 LAB — POC SARS CORONAVIRUS 2 AG -  ED: SARS Coronavirus 2 Ag: NEGATIVE

## 2023-03-27 MED ORDER — BENZONATATE 200 MG PO CAPS: 200.0000 mg | ORAL_CAPSULE | Freq: Three times a day (TID) | ORAL | 0 refills | Status: AC | PRN

## 2023-03-27 NOTE — Telephone Encounter (Signed)
medicare/BC[employer] sup NPR sent to GI [phone] 

## 2023-03-27 NOTE — ED Triage Notes (Addendum)
Since Saturday has had cough, achiness, sneezing, sore throat. Had fever this morning. Has colonoscopy Wednesday, wants to be fever free for this per her physician's orders.

## 2023-03-27 NOTE — ED Provider Notes (Signed)
 [facility]    CSN: [id number] Arrival date & time: 03/27/23  0807      History   Chief Complaint No chief complaint on file.   HPI Kimberly Griffith is a 68 y.o. female.   HPI Kimberly Griffith 68 year old female presents with cough, achiness and sneezing since this morning.  Patient reports that she has colonoscopy scheduled for tomorrow Wednesday, 03/28/2023 and needs to be fever free.  PMH significant for obesity, tremor of left hand, and primary hypertension  Past Medical History:  Diagnosis Date   Allergy    kiwi   Arthritis 2022   hands   Blood transfusion without reported diagnosis 02/21/1991   Dx'd with E.Coli   Cancer (HCC) 02/21/2015   endometrial   Chronic kidney disease    Clotting disorder (HCC)    Headache    History of kidney stones 02/20/2006   Hypertension 2017   Suspected   Neuromuscular disorder (HCC) 2020   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, hand   Oxygen deficiency    Pneumonia 02/20/2013   Tremor    left thumb    Patient Active Problem List   Diagnosis Date Noted   Recurrent UTI 02/08/2023   Acute cystitis without hematuria 02/08/2023   Candidiasis 12/20/2022   Screening for malignant neoplasm of skin 12/20/2022   Screening for colon cancer 12/20/2022   Primary hypertension 12/06/2022   Tremor of left hand 12/06/2022   Acute deep vein thrombosis (DVT) of popliteal vein of left lower extremity (HCC) 11/20/2022   Foot sprain, left, subsequent encounter 11/28/2019   Endometrial cancer (HCC) 07/05/2015    Past Surgical History:  Procedure Laterality Date   ABDOMINAL HYSTERECTOMY  2017   COLONOSCOPY     LYMPH NODE BIOPSY Bilateral 07/13/2015   Procedure: SENTINEL LYMPH NODE BIOPSY;  Surgeon: Sari Bachelor, MD;  Location: [facility];  Service: Gynecology;  Laterality: Bilateral;   ROBOTIC ASSISTED TOTAL HYSTERECTOMY WITH BILATERAL SALPINGO OOPHERECTOMY Bilateral 07/13/2015   Procedure: XI ROBOTIC ASSISTED LAPAROSCOPIC TOTAL HYSTERECTOMY WITH BILATERAL  SALPINGO OOPHORECTOMY;  Surgeon: Sari Bachelor, MD;  Location: [facility];  Service: Gynecology;  Laterality: Bilateral;   TONSILLECTOMY AND ADENOIDECTOMY     WISDOM TOOTH EXTRACTION      OB History     Gravida  7   Para  6   Term      Preterm      AB  1   Living  6      SAB  1   IAB      Ectopic      Multiple      Live Births               Home Medications    Prior to Admission medications   Medication Sig Start Date End Date Taking? Authorizing Provider  benzonatate  (TESSALON ) 200 MG capsule Take 1 capsule (200 mg total) by mouth 3 (three) times daily as needed for up to 7 days. 03/27/23 04/03/23 Yes Teddy Sharper, FNP  Cholecalciferol (D3) 25 MCG (1000 UT) capsule Take 1,000 Units by mouth daily.    [provider]  Cranberry 500 MG CAPS Take by mouth.    [provider]  hydrochlorothiazide  (HYDRODIURIL ) 25 MG tablet Take 1 tablet (25 mg total) by mouth daily. 12/20/22   Bevin Bernice RAMAN, DO  Lactase 9000 units TABS Take by mouth.    [provider]  Multiple Vitamin (MULTIVITAMIN) capsule Take 1 capsule by mouth daily. Over 50    [provider]  Multiple Vitamins-Minerals (CENTRUM ADULT PO) Take by mouth.    [provider]  mupirocin 2% oint-hydrocortisone 2.5% cream-nystatin  cream-zinc  oxide 13% oint 1:1:1:5 mixture Apply topically 2 (two) times daily as needed. 12/20/22   Bevin Bernice RAMAN, DO  nystatin  (MYCOSTATIN /NYSTOP ) powder Apply 1 Application topically 3 (three) times daily. 12/20/22   Bevin Bernice RAMAN, DO  phenazopyridine  (PYRIDIUM ) 200 MG tablet Take 1 tablet (200 mg total) by mouth 3 (three) times daily as needed for pain. 02/08/23   Bevin Bernice RAMAN, DO  triamcinolone  cream (KENALOG ) 0.5 % Apply 1 Application topically 2 (two) times daily. To affected areas. 12/20/22   Bevin Bernice RAMAN, DO    Family History Family History  Problem Relation Age of Onset   Hypertension Mother    Arthritis Mother    Stroke Mother     Varicose Veins Mother    Other Father        dec unknown cause age 8   Hypertension Father    Prostate cancer Father    Hearing loss Father    Hypertension Maternal Grandmother    Stroke Maternal Grandmother    Stomach cancer Maternal Grandmother    Miscarriages / Stillbirths Maternal Grandmother    Vision loss Maternal Grandmother    Alcohol abuse Maternal Grandfather    Varicose Veins Maternal Grandfather    Heart disease Paternal Grandmother    Miscarriages / Stillbirths Paternal Grandmother    Varicose Veins Paternal Grandmother    Heart disease Paternal Grandfather    Colon cancer Neg Hx    Rectal cancer Neg Hx    Esophageal cancer Neg Hx    Liver cancer Neg Hx    Colon polyps Neg Hx     Social History Social History   Tobacco Use   Smoking status: Former    Current packs/day: 0.00    Average packs/day: 1.5 packs/day for 10.0 years (15.0 ttl pk-yrs)    Types: Cigarettes    Quit date: 07/04/1984    Years since quitting: 38.7   Smokeless tobacco: Former    Quit date: 02/21/1984   Tobacco comments:    I haven't smoked in 38 years  Vaping Use   Vaping status: Never Used  Substance Use Topics   Alcohol use: Yes    Alcohol/week: 1.0 standard drink of alcohol    Types: 1 Standard drinks or equivalent per week    Comment: Beer once a month in summer   Drug use: Never     Allergies   Kiwi extract and Amoxicillin    Review of Systems Review of Systems  Constitutional:  Positive for fatigue.  Respiratory:  Positive for cough.   All other systems reviewed and are negative.    Physical Exam Triage Vital Signs ED Triage Vitals  Encounter Vitals Group     BP 03/27/23 0814 (!) 150/81     Systolic BP Percentile --      Diastolic BP Percentile --      Pulse Rate 03/27/23 0814 99     Resp 03/27/23 0814 16     Temp 03/27/23 0814 98.3 F (36.8 C)     Temp src --      SpO2 03/27/23 0814 98 %     Weight --      Height --      Head Circumference --      Peak  Flow --      Pain Score 03/27/23 0819 4     Pain Loc --  Pain Education --      Exclude from Growth Chart --    No data found.  Updated Vital Signs BP (!) 150/81   Pulse 99   Temp 98.3 F (36.8 C)   Resp 16   LMP 02/21/2011 (Approximate)   SpO2 98%    Physical Exam Vitals and nursing note reviewed.  Constitutional:      Appearance: Normal appearance. She is obese.  HENT:     Head: Normocephalic and atraumatic.     Right Ear: Tympanic membrane, ear canal and external ear normal.     Left Ear: Tympanic membrane, ear canal and external ear normal.     Mouth/Throat:     Mouth: Mucous membranes are moist.     Pharynx: Oropharynx is clear.  Eyes:     Extraocular Movements: Extraocular movements intact.     Conjunctiva/sclera: Conjunctivae normal.     Pupils: Pupils are equal, round, and reactive to light.  Cardiovascular:     Rate and Rhythm: Normal rate and regular rhythm.     Pulses: Normal pulses.     Heart sounds: Normal heart sounds. No murmur heard. Pulmonary:     Effort: Pulmonary effort is normal.     Breath sounds: Normal breath sounds. No wheezing, rhonchi or rales.  Musculoskeletal:        General: Normal range of motion.     Cervical back: Normal range of motion and neck supple.  Skin:    General: Skin is warm and dry.  Neurological:     General: No focal deficit present.     Mental Status: She is alert and oriented to person, place, and time. Mental status is at baseline.  Psychiatric:        Mood and Affect: Mood normal.        Behavior: Behavior normal.      UC Treatments / Results  Labs (all labs ordered are listed, but only abnormal results are displayed) Labs Reviewed  POC SARS CORONAVIRUS 2 AG -  ED  POCT INFLUENZA A/B    EKG   Radiology No results found.  Procedures Procedures (including critical care time)  Medications Ordered in UC Medications - No data to display  Initial Impression / Assessment and Plan / UC Course  I  have reviewed the triage vital signs and the nursing notes.  Pertinent labs & imaging results that were available during my care of the patient were reviewed by me and considered in my medical decision making (see chart for details).     MDM: 1.  Influenza-like illness-influenza A/B and COVID-19 negative; 2.  Cough, unspecified type-Rx'd Tessalon  200 mg capsules: Take 1 capsule 3 times daily, as needed for cough. Advised patient may take Tessalon  capsules daily or as needed for cough.  Advised may take OTC Tylenol  1 g every 6 hours for fever (oral temperature greater than 100.3).  Encouraged to increase daily water  intake to 64 ounces per day while taking this medication.  Advised if symptoms worsen and/or unresolved please follow-up with your PCP or here for further evaluation. Final Clinical Impressions(s) / UC Diagnoses   Final diagnoses:  Influenza-like illness  Cough, unspecified type     Discharge Instructions      Advised patient may take Tessalon  capsules daily or as needed for cough.  Advised may take OTC Tylenol  1 g every 6 hours for fever (oral temperature greater than 100.3).  Encouraged to increase daily water  intake to 64 ounces per day while taking  this medication.  Advised if symptoms worsen and/or unresolved please follow-up with your PCP or here for further evaluation.     ED Prescriptions     Medication Sig Dispense Auth. Provider   benzonatate  (TESSALON ) 200 MG capsule Take 1 capsule (200 mg total) by mouth 3 (three) times daily as needed for up to 7 days. 40 capsule Tawanna Funk, FNP      PDMP not reviewed this encounter.   Teddy Sharper, FNP 03/27/23 [phone]

## 2023-03-27 NOTE — Discharge Instructions (Addendum)
 Advised patient may take Tessalon  capsules daily or as needed for cough.  Advised may take OTC Tylenol  1 g every 6 hours for fever (oral temperature greater than 100.3).  Encouraged to increase daily water  intake to 64 ounces per day while taking this medication.  Advised if symptoms worsen and/or unresolved please follow-up with your PCP or here for further evaluation.

## 2023-03-30 ENCOUNTER — Ambulatory Visit (AMBULATORY_SURGERY_CENTER): Payer: Medicare Other | Admitting: Gastroenterology

## 2023-03-30 ENCOUNTER — Encounter: Payer: Self-pay | Admitting: Gastroenterology

## 2023-03-30 ENCOUNTER — Encounter: Payer: Self-pay | Admitting: Family Medicine

## 2023-03-30 VITALS — BP 138/77 | HR 65 | Temp 97.9°F | Resp 13 | Ht 63.0 in | Wt 215.0 lb

## 2023-03-30 DIAGNOSIS — D123 Benign neoplasm of transverse colon: Secondary | ICD-10-CM

## 2023-03-30 DIAGNOSIS — D121 Benign neoplasm of appendix: Secondary | ICD-10-CM | POA: Diagnosis not present

## 2023-03-30 DIAGNOSIS — Z1211 Encounter for screening for malignant neoplasm of colon: Secondary | ICD-10-CM

## 2023-03-30 DIAGNOSIS — K5730 Diverticulosis of large intestine without perforation or abscess without bleeding: Secondary | ICD-10-CM

## 2023-03-30 DIAGNOSIS — K648 Other hemorrhoids: Secondary | ICD-10-CM

## 2023-03-30 DIAGNOSIS — D125 Benign neoplasm of sigmoid colon: Secondary | ICD-10-CM | POA: Diagnosis not present

## 2023-03-30 DIAGNOSIS — Z860100 Personal history of colon polyps, unspecified: Secondary | ICD-10-CM

## 2023-03-30 MED ORDER — SODIUM CHLORIDE 0.9 % IV SOLN: 500.0000 mL | Freq: Once | INTRAVENOUS | Status: DC

## 2023-03-30 NOTE — Progress Notes (Signed)
 Pt's states no medical or surgical changes since previsit or office visit.

## 2023-03-30 NOTE — Progress Notes (Signed)
 Vss nad trans to pacu

## 2023-03-30 NOTE — Patient Instructions (Signed)

## 2023-03-30 NOTE — Progress Notes (Signed)
 [facility] History and Physical   Primary Care Physician:  Bevin Bernice RAMAN, DO   Reason for Procedure:   History of colon polyps  Plan:    colonoscopy     HPI: Kimberly Griffith is a 68 y.o. female  here for colonoscopy surveillance - 6 polyps removed 02/2016, most sessile serrated, had recommended a repeat exam 3 years later..   Patient denies any bowel symptoms at this time that are new - chronic diarrhea with negative biopsies for [facility] in the past. No family history of colon cancer known. Otherwise feels well without any cardiopulmonary symptoms.   I have discussed risks / benefits of anesthesia and endoscopic procedure with Asna Renier and they wish to proceed with the exams as outlined today.    Past Medical History:  Diagnosis Date   Allergy    kiwi   Arthritis 2022   hands   Blood transfusion without reported diagnosis 02/21/1991   Dx'd with E.Coli   Cancer (HCC) 02/21/2015   endometrial   Chronic kidney disease    Clotting disorder (HCC)    Headache    History of kidney stones 02/20/2006   Hypertension 2017   Suspected   Neuromuscular disorder (HCC) 2020   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, hand   Oxygen deficiency    Pneumonia 02/20/2013   Tremor    left thumb    Past Surgical History:  Procedure Laterality Date   ABDOMINAL HYSTERECTOMY  2017   COLONOSCOPY  02/2016   3 yr recall, Elspeth Naval   LYMPH NODE BIOPSY Bilateral 07/13/2015   Procedure: SENTINEL LYMPH NODE BIOPSY;  Surgeon: Sari Bachelor, MD;  Location: [facility];  Service: Gynecology;  Laterality: Bilateral;   ROBOTIC ASSISTED TOTAL HYSTERECTOMY WITH BILATERAL SALPINGO OOPHERECTOMY Bilateral 07/13/2015   Procedure: XI ROBOTIC ASSISTED LAPAROSCOPIC TOTAL HYSTERECTOMY WITH BILATERAL SALPINGO OOPHORECTOMY;  Surgeon: Sari Bachelor, MD;  Location: [facility];  Service: Gynecology;  Laterality: Bilateral;   TONSILLECTOMY AND ADENOIDECTOMY     WISDOM TOOTH EXTRACTION      Prior to Admission medications    Medication Sig Start Date End Date Taking? Authorizing Provider  hydrochlorothiazide  (HYDRODIURIL ) 25 MG tablet Take 1 tablet (25 mg total) by mouth daily. 12/20/22  Yes Bevin, Erika S, DO  benzonatate  (TESSALON ) 200 MG capsule Take 1 capsule (200 mg total) by mouth 3 (three) times daily as needed for up to 7 days. Patient not taking: Reported on 03/30/2023 03/27/23 04/03/23  Ragan, Michael, FNP  Cholecalciferol (D3) 25 MCG (1000 UT) capsule Take 1,000 Units by mouth daily.    [provider]  Cranberry 500 MG CAPS Take by mouth.    [provider]  Lactase 9000 units TABS Take by mouth.    [provider]  Multiple Vitamin (MULTIVITAMIN) capsule Take 1 capsule by mouth daily. Over 50    [provider]  Multiple Vitamins-Minerals (CENTRUM ADULT PO) Take by mouth.    [provider]  mupirocin 2% oint-hydrocortisone 2.5% cream-nystatin  cream-zinc  oxide 13% oint 1:1:1:5 mixture Apply topically 2 (two) times daily as needed. 12/20/22   Bevin Bernice RAMAN, DO  nystatin  (MYCOSTATIN /NYSTOP ) powder Apply 1 Application topically 3 (three) times daily. 12/20/22   Bevin Bernice RAMAN, DO  phenazopyridine  (PYRIDIUM ) 200 MG tablet Take 1 tablet (200 mg total) by mouth 3 (three) times daily as needed for pain. 02/08/23   Bevin Bernice RAMAN, DO  triamcinolone  cream (KENALOG ) 0.5 % Apply 1 Application topically 2 (two) times daily. To affected areas. 12/20/22  Bevin Bernice RAMAN, DO    Current Outpatient Medications  Medication Sig Dispense Refill   hydrochlorothiazide  (HYDRODIURIL ) 25 MG tablet Take 1 tablet (25 mg total) by mouth daily. 30 tablet 3   benzonatate  (TESSALON ) 200 MG capsule Take 1 capsule (200 mg total) by mouth 3 (three) times daily as needed for up to 7 days. (Patient not taking: Reported on 03/30/2023) 40 capsule 0   Cholecalciferol (D3) 25 MCG (1000 UT) capsule Take 1,000 Units by mouth daily.     Cranberry 500 MG CAPS Take by mouth.     Lactase 9000 units TABS Take  by mouth.     Multiple Vitamin (MULTIVITAMIN) capsule Take 1 capsule by mouth daily. Over 50     Multiple Vitamins-Minerals (CENTRUM ADULT PO) Take by mouth.     mupirocin 2% oint-hydrocortisone 2.5% cream-nystatin  cream-zinc  oxide 13% oint 1:1:1:5 mixture Apply topically 2 (two) times daily as needed. 120 g 2   nystatin  (MYCOSTATIN /NYSTOP ) powder Apply 1 Application topically 3 (three) times daily. 15 g 0   phenazopyridine  (PYRIDIUM ) 200 MG tablet Take 1 tablet (200 mg total) by mouth 3 (three) times daily as needed for pain. 10 tablet 0   triamcinolone  cream (KENALOG ) 0.5 % Apply 1 Application topically 2 (two) times daily. To affected areas. 30 g 3   Current Facility-Administered Medications  Medication Dose Route Frequency Provider Last Rate Last Admin   0.9 %  sodium chloride  infusion  500 mL Intravenous Once Saydee Zolman, Elspeth SQUIBB, MD        Allergies as of 03/30/2023 - Review Complete 03/30/2023  Allergen Reaction Noted   Kiwi extract Swelling and Other (See Comments) 07/07/2015   Amoxicillin  Diarrhea and Nausea And Vomiting 11/12/2022    Family History  Problem Relation Age of Onset   Hypertension Mother    Arthritis Mother    Stroke Mother    Varicose Veins Mother    Other Father        dec unknown cause age 76   Hypertension Father    Prostate cancer Father    Hearing loss Father    Hypertension Maternal Grandmother    Stroke Maternal Grandmother    Stomach cancer Maternal Grandmother    Miscarriages / Stillbirths Maternal Grandmother    Vision loss Maternal Grandmother    Alcohol abuse Maternal Grandfather    Varicose Veins Maternal Grandfather    Heart disease Paternal Grandmother    Miscarriages / Stillbirths Paternal Grandmother    Varicose Veins Paternal Grandmother    Heart disease Paternal Grandfather    Colon cancer Neg Hx    Rectal cancer Neg Hx    Esophageal cancer Neg Hx    Liver cancer Neg Hx    Colon polyps Neg Hx     Social History    Socioeconomic History   Marital status: Married    Spouse name: Not on file   Number of children: 6   Years of education: Not on file   Highest education level: Not on file  Occupational History   Occupation: Emergency Planning/management Officer  Tobacco Use   Smoking status: Former    Current packs/day: 0.00    Average packs/day: 1.5 packs/day for 10.0 years (15.0 ttl pk-yrs)    Types: Cigarettes    Quit date: 07/04/1984    Years since quitting: 38.7   Smokeless tobacco: Former    Quit date: 02/21/1984   Tobacco comments:    I haven't smoked in 38 years  Vaping Use   Vaping status:  Never Used  Substance and Sexual Activity   Alcohol use: Yes    Alcohol/week: 1.0 standard drink of alcohol    Types: 1 Standard drinks or equivalent per week    Comment: Beer once a month in summer   Drug use: Never   Sexual activity: Yes    Partners: Male    Birth control/protection: Post-menopausal, Surgical  Other Topics Concern   Not on file  Social History Narrative   Not on file   Social Drivers of Health   Financial Resource Strain: Not on file  Food Insecurity: Not on file  Transportation Needs: Not on file  Physical Activity: Not on file  Stress: Not on file  Social Connections: Not on file  Intimate Partner Violence: Not on file    Review of Systems: All other review of systems negative except as mentioned in the HPI.  Physical Exam: Vital signs BP (!) 154/78   Pulse 92   Temp 97.9 F (36.6 C) (Temporal)   Ht 5' 3 (1.6 m)   Wt 215 lb (97.5 kg)   LMP 02/21/2011 (Approximate)   SpO2 96%   BMI 38.09 kg/m   General:   Alert,  Well-developed, pleasant and cooperative in NAD Lungs:  Clear throughout to auscultation.   Heart:  Regular rate and rhythm Abdomen:  Soft, nontender and nondistended.   Neuro/Psych:  Alert and cooperative. Normal mood and affect. A and O x 3  Marcey Naval, MD [facility]

## 2023-03-30 NOTE — Op Note (Signed)
 [facility] Patient Name: Kimberly Griffith Procedure Date: 03/30/2023 10:40 AM MRN: [id number] Endoscopist: Elspeth P. Leigh , MD, [id number] Age: 68 Referring MD:  Date of Birth: 10-14-1955 Gender: Female Account #: [id number] Procedure:                Colonoscopy Indications:              High risk colon cancer surveillance: Personal                            history of colonic polyps - 5 polyps removed 02/2016 Medicines:                Monitored Anesthesia Care Procedure:                Pre-Anesthesia Assessment:                           - Prior to the procedure, a History and Physical                            was performed, and patient medications and                            allergies were reviewed. The patient's tolerance of                            previous anesthesia was also reviewed. The risks                            and benefits of the procedure and the sedation                            options and risks were discussed with the patient.                            All questions were answered, and informed consent                            was obtained. Prior Anticoagulants: The patient has                            taken no anticoagulant or antiplatelet agents. ASA                            Grade Assessment: II - A patient with mild systemic                            disease. After reviewing the risks and benefits,                            the patient was deemed in satisfactory condition to                            undergo the procedure.  After obtaining informed consent, the colonoscope                            was passed under direct vision. Throughout the                            procedure, the patient's blood pressure, pulse, and                            oxygen saturations were monitored continuously. The                            CF [id number] #[id number] was introduced through the anus                            and  advanced to the the cecum, identified by                            appendiceal orifice and ileocecal valve. The                            colonoscopy was performed without difficulty. The                            patient tolerated the procedure well. The quality                            of the bowel preparation was good. The ileocecal                            valve, appendiceal orifice, and rectum were                            photographed. Scope In: 10:45:22 AM Scope Out: 11:02:35 AM Scope Withdrawal Time: 0 hours 10 minutes 37 seconds  Total Procedure Duration: 0 hours 17 minutes 13 seconds  Findings:                 The perianal and digital rectal examinations were                            normal.                           A 3 mm polyp was found in the appendiceal orifice.                            The polyp was flat. The polyp was removed with a                            cold snare. Resection and retrieval were complete.                           Two sessile polyps were found in the transverse  colon. The polyps were 2 to 4 mm in size. These                            polyps were removed with a cold snare. Resection                            and retrieval were complete.                           A 3 mm polyp was found in the splenic flexure. The                            polyp was sessile. The polyp was removed with a                            cold snare. Resection and retrieval were complete.                           A 3 mm polyp was found in the sigmoid colon. The                            polyp was sessile. The polyp was removed with a                            cold snare. Resection and retrieval were complete.                           Multiple small-mouthed diverticula were found in                            the sigmoid colon.                           Internal hemorrhoids were found during                            retroflexion.  The hemorrhoids were small.                           The exam was otherwise without abnormality. Complications:            No immediate complications. Estimated blood loss:                            Minimal. Estimated Blood Loss:     Estimated blood loss was minimal. Impression:               - One 3 mm polyp at the appendiceal orifice,                            removed with a cold snare. Resected and retrieved.                           - Two 2 to 4 mm polyps in  the transverse colon,                            removed with a cold snare. Resected and retrieved.                           - One 3 mm polyp at the splenic flexure, removed                            with a cold snare. Resected and retrieved.                           - One 3 mm polyp in the sigmoid colon, removed with                            a cold snare. Resected and retrieved.                           - Diverticulosis in the sigmoid colon.                           - Internal hemorrhoids.                           - The examination was otherwise normal. Recommendation:           - Patient has a contact number available for                            emergencies. The signs and symptoms of potential                            delayed complications were discussed with the                            patient. Return to normal activities tomorrow.                            Written discharge instructions were provided to the                            patient.                           - Resume previous diet.                           - Continue present medications.                           - Await pathology results. Elspeth P. Amee Boothe, MD 03/30/2023 11:08:11 AM This report has been signed electronically.

## 2023-03-30 NOTE — Progress Notes (Signed)
 Called to room to assist during endoscopic procedure.  Patient ID and intended procedure confirmed with present staff. Received instructions for my participation in the procedure from the performing physician.

## 2023-04-02 ENCOUNTER — Telehealth: Payer: Self-pay

## 2023-04-02 MED ORDER — HYDROCHLOROTHIAZIDE 25 MG PO TABS: 25.0000 mg | ORAL_TABLET | Freq: Every day | ORAL | 0 refills | Status: DC

## 2023-04-02 NOTE — Telephone Encounter (Signed)
 Attempted f/u call. No answer, left VM.

## 2023-04-03 LAB — SURGICAL PATHOLOGY

## 2023-04-05 ENCOUNTER — Telehealth: Payer: Self-pay

## 2023-04-05 ENCOUNTER — Encounter: Payer: Self-pay | Admitting: Family Medicine

## 2023-04-05 ENCOUNTER — Ambulatory Visit (INDEPENDENT_AMBULATORY_CARE_PROVIDER_SITE_OTHER): Payer: Medicare Other | Admitting: Family Medicine

## 2023-04-05 ENCOUNTER — Ambulatory Visit: Payer: Self-pay | Admitting: Family Medicine

## 2023-04-05 ENCOUNTER — Ambulatory Visit: Payer: Medicare Other | Admitting: Family Medicine

## 2023-04-05 VITALS — BP 160/83 | HR 71 | Ht 63.0 in | Wt 216.0 lb

## 2023-04-05 DIAGNOSIS — R7989 Other specified abnormal findings of blood chemistry: Secondary | ICD-10-CM | POA: Diagnosis not present

## 2023-04-05 DIAGNOSIS — R251 Tremor, unspecified: Secondary | ICD-10-CM | POA: Diagnosis not present

## 2023-04-05 DIAGNOSIS — I10 Essential (primary) hypertension: Secondary | ICD-10-CM | POA: Diagnosis not present

## 2023-04-05 DIAGNOSIS — H43391 Other vitreous opacities, right eye: Secondary | ICD-10-CM | POA: Insufficient documentation

## 2023-04-05 NOTE — Assessment & Plan Note (Signed)
Initial BP quite high, improved on recheck but still a little elevated.  I asked her to monitor at home and send readings over the next few weeks.  Low sodium diet encouraged.

## 2023-04-05 NOTE — Progress Notes (Signed)
Kimberly Griffith - 68 y.o. female MRN [id number]  Date of birth: [birth year]  Subjective Chief Complaint  Patient presents with   Eye Problem   Breast Mass   Thyroid Problem    HPI Kimberly Griffith is a 68 y.o. here today with complaint of eye irritation.   Reports that she though she was having a migraine yesterday.  She feels like there is an "image of a fly" in the R eye.  Takes time to focus when looking to L and R.  Denies flashes.     Remains on hydrochlorothiazide for HTN.  BP today is elevated on initial check.  No side effects from medication at current strength.  She has not had chest pain, dyspnea, or increased headache.    Seen by neurology due to tremor of hand.  TSH checked by neurology and found to be elevated.     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   Blood transfusion without reported diagnosis 02/21/1991   Dx'd with E.Coli   Cancer (HCC) 02/21/2015   endometrial   Chronic kidney disease    Clotting disorder (HCC)    Headache    History of kidney stones 02/20/2006   Hypertension 2017   Suspected   Neuromuscular disorder (HCC) 2020   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, hand   Oxygen deficiency    Pneumonia 02/20/2013   Tremor    left thumb    Past Surgical History:  Procedure Laterality Date   ABDOMINAL HYSTERECTOMY  2017   COLONOSCOPY  02/2016   3 yr recall, Ileene Patrick   LYMPH NODE BIOPSY Bilateral 07/13/2015   Procedure: SENTINEL LYMPH NODE BIOPSY;  Surgeon: Laurette Schimke, MD;  Location: [facility];  Service: Gynecology;  Laterality: Bilateral;   ROBOTIC ASSISTED TOTAL HYSTERECTOMY WITH BILATERAL SALPINGO OOPHERECTOMY Bilateral 07/13/2015   Procedure: XI ROBOTIC ASSISTED LAPAROSCOPIC TOTAL HYSTERECTOMY WITH BILATERAL SALPINGO OOPHORECTOMY;  Surgeon: Laurette Schimke,  MD;  Location: [facility];  Service: Gynecology;  Laterality: Bilateral;   TONSILLECTOMY AND ADENOIDECTOMY     WISDOM TOOTH EXTRACTION      Social History   Socioeconomic History   Marital status: Married    Spouse name: Not on file   Number of children: 6   Years of education: Not on file   Highest education level: Not on file  Occupational History   Occupation: Emergency planning/management officer  Tobacco Use   Smoking status: Former    Current packs/day: 0.00    Average packs/day: 1.5 packs/day for 10.0 years (15.0 ttl pk-yrs)    Types: Cigarettes    Quit date: 07/04/1984    Years since quitting: 38.7   Smokeless tobacco: Former    Quit date: 02/21/1984   Tobacco comments:    I haven't smoked in 38 years  Vaping Use   Vaping status: Never Used  Substance and Sexual Activity   Alcohol use: Yes    Alcohol/week: 1.0 standard drink of alcohol    Types: 1 Standard drinks or equivalent per week    Comment: Beer once a month in summer   Drug use: Never   Sexual activity: Yes    Partners: Male    Birth control/protection: Post-menopausal, Surgical  Other Topics Concern   Not on file  Social History  Narrative   Not on file   Social Drivers of Health   Financial Resource Strain: Not on file  Food Insecurity: Not on file  Transportation Needs: Not on file  Physical Activity: Not on file  Stress: Not on file  Social Connections: Not on file    Family History  Problem Relation Age of Onset   Hypertension Mother    Arthritis Mother    Stroke Mother    Varicose Veins Mother    Other Father        dec unknown cause age 60   Hypertension Father    Prostate cancer Father    Hearing loss Father    Hypertension Maternal Grandmother    Stroke Maternal Grandmother    Stomach cancer Maternal Grandmother    Miscarriages / Stillbirths Maternal Grandmother    Vision loss Maternal Grandmother    Alcohol abuse Maternal Grandfather    Varicose Veins Maternal Grandfather    Heart disease Paternal  Grandmother    Miscarriages / Stillbirths Paternal Grandmother    Varicose Veins Paternal Grandmother    Heart disease Paternal Grandfather    Colon cancer Neg Hx    Rectal cancer Neg Hx    Esophageal cancer Neg Hx    Liver cancer Neg Hx    Colon polyps Neg Hx     Health Maintenance  Topic Date Due   Medicare Annual Wellness (AWV)  Never done   COVID-19 Vaccine (1) Never done   Hepatitis C Screening  Never done   Zoster Vaccines- Shingrix (1 of 2) Never done   MAMMOGRAM  06/17/2017   DEXA SCAN  Never done   INFLUENZA VACCINE  05/21/2023 (Originally 09/21/2022)   DTaP/Tdap/Td (1 - Tdap) 12/20/2023 (Originally 11/30/1974)   Pneumonia Vaccine 6+ Years old (1 of 1 - PCV) 12/20/2023 (Originally 11/29/2020)   Colonoscopy  03/29/2026   HPV VACCINES  Aged Out     ----------------------------------------------------------------------------------------------------------------------------------------------------------------------------------------------------------------- Physical Exam BP (!) 160/83 (BP Location: Left Arm, Patient Position: Sitting, Cuff Size: Large)   Pulse 71   Ht 5\' 3"  (1.6 m)   Wt 216 lb (98 kg)   LMP 02/21/2011 (Approximate)   SpO2 100%   BMI 38.26 kg/m   Physical Exam Constitutional:      Appearance: Normal appearance.  HENT:     Head: Normocephalic and atraumatic.  Eyes:     General: No scleral icterus.    Pupils: Pupils are equal, round, and reactive to light.     Comments: Difficult to visualize the back back on the eye on funduscopic exam.   Cardiovascular:     Rate and Rhythm: Normal rate and regular rhythm.  Pulmonary:     Effort: Pulmonary effort is normal.     Breath sounds: Normal breath sounds.  Neurological:     General: No focal deficit present.     Mental Status: She is alert.  Psychiatric:        Mood and Affect: Mood normal.        Behavior: Behavior normal.      ------------------------------------------------------------------------------------------------------------------------------------------------------------------------------------------------------------------- Assessment and Plan  Primary hypertension Initial BP quite high, improved on recheck but still a little elevated.  I asked her to monitor at home and send readings over the next few weeks.  Low sodium diet encouraged.   Tremor of left hand Seen by neurology.  Has upcoming MRI brain.  TSH abnormal.  Rechecking TSH + T4.   Vitreous floaters of right eye Likely vitreous floaters but would like for  her to see ophthalmology to rule out other conditions. No red flags such as flashes or pain.    No orders of the defined types were placed in this encounter.   No follow-ups on file.    This visit occurred during the SARS-CoV-2 public health emergency.  Safety protocols were in place, including screening questions prior to the visit, additional usage of staff PPE, and extensive cleaning of exam room while observing appropriate contact time as indicated for disinfecting solutions.

## 2023-04-05 NOTE — Telephone Encounter (Signed)
  Chief Complaint: Vision changes Symptoms: blurred vision, lights in vision, spot in eye, eye irritation Frequency: Constant Pertinent Negatives: Patient denies headache, eye swelling, eye drainage Disposition: [x] ED /[] Urgent Care (no appt availability in office) / [] Appointment(In office/virtual)/ []  [facility]/ [] Home Care/ [x] Refused Recommended Disposition /[] [place]/ []  Follow-up with PCP Additional Notes: Patient called with complaints of vision changes after rubbing right eye and spot on head. Patient states that she has a history of ocular migraines, and her symptoms started last night after rubbing a spot on her head which caused lights to erupt in her eye. Patient states right eye is affected and she rubbed her eye to try to clear the lights and now sees a fly-like spot in eye with severe irritation and blurriness. Patient denies eye swelling and eye drainage. Current pain is "a 7 or an 8, but the irritation is off the charts." Patient advised by this RN to go to the ER per protocol however patient refused. Patient advised by this RN note will be routed to office for PCP review. Patient verbalized understanding.  Copied from CRM [id number]. Topic: Clinical - Red Word Triage >> Apr 05, 2023  9:48 AM Gildardo Pounds wrote: Red Word that prompted transfer to Nurse Triage: Patient scratched right eye; looks like a fly in her eye. Callback number is [phone] Reason for Disposition  [1] Blurred vision AND [2] new or worsening  Answer Assessment - Initial Assessment Questions 1. ONSET: "When did the pain start?" (e.g., minutes, hours, days)     Yesterday 2. TIMING: "Does the pain come and go, or has it been constant since it started?" (e.g., constant, intermittent, fleeting)     Constant 3. SEVERITY: "How bad is the pain?"   (Scale 1-10; mild, moderate or severe)   - MILD (1-3): doesn't interfere with normal activities    - MODERATE (4-7): interferes with normal  activities or awakens from sleep    - SEVERE (8-10): excruciating pain and patient unable to do normal activities     7 or 8 4. LOCATION: "Where does it hurt?"  (e.g., eyelid, eye, cheekbone)     Right eye 5. CAUSE: "What do you think is causing the pain?"     "I have a history of ocular migraines but I didn't have the migraine when it started. I rubbed a spot on my head and then this started to happen." 6. VISION: "Do you have blurred vision or changes in your vision?"      "Blurry spot around the fly thing" 7. EYE DISCHARGE: "Is there any discharge (pus) from the eye(s)?"  If Yes, ask: "What color is it?"      Denies 8. FEVER: "Do you have a fever?" If Yes, ask: "What is it, how was it measured, and when did it start?"      Denies 9. OTHER SYMPTOMS: "Do you have any other symptoms?" (e.g., headache, nasal discharge, facial rash)     Denies  Protocols used: Eye Pain and Other Symptoms-A-AH

## 2023-04-05 NOTE — Assessment & Plan Note (Signed)
Likely vitreous floaters but would like for her to see ophthalmology to rule out other conditions. No red flags such as flashes or pain.

## 2023-04-05 NOTE — Assessment & Plan Note (Signed)
Seen by neurology.  Has upcoming MRI brain.  TSH abnormal.  Rechecking TSH + T4.

## 2023-04-05 NOTE — Telephone Encounter (Signed)
Pt was seen today. States she has a knot behind left breast near armpit. Lump is painful on palpation and with certain positions. Due to lump Imaging declined scheduling. Dx Mammogram required. Location: [facility].   Sending information to PCP for consideration.

## 2023-04-06 ENCOUNTER — Other Ambulatory Visit: Payer: Self-pay | Admitting: Family Medicine

## 2023-04-06 DIAGNOSIS — Z1231 Encounter for screening mammogram for malignant neoplasm of breast: Secondary | ICD-10-CM

## 2023-04-06 LAB — TSH+FREE T4
Free T4: 0.87 ng/dL (ref 0.82–1.77)
TSH: 10 u[IU]/mL — ABNORMAL HIGH (ref 0.450–4.500)

## 2023-04-07 ENCOUNTER — Encounter: Payer: Self-pay | Admitting: Gastroenterology

## 2023-04-10 ENCOUNTER — Encounter: Payer: Self-pay | Admitting: Family Medicine

## 2023-04-21 ENCOUNTER — Ambulatory Visit
Admission: RE | Admit: 2023-04-21 | Discharge: 2023-04-21 | Disposition: A | Discharge status: Home / Self Care | Payer: Medicare Other | Source: Ambulatory Visit | Attending: Neurology | Admitting: Neurology

## 2023-04-21 DIAGNOSIS — R251 Tremor, unspecified: Secondary | ICD-10-CM

## 2023-04-24 ENCOUNTER — Encounter: Payer: Self-pay | Admitting: Neurology

## 2023-05-07 NOTE — Progress Notes (Deleted)
 New Patient Evaluation and Consultation  Referring Provider: Charlton Amor, DO PCP: Charlton Amor, DO Date of Service: 05/08/2023  SUBJECTIVE Chief Complaint: No chief complaint on file.  History of Present Illness: Kimberly Griffith is a 68 y.o. White or Caucasian female seen in consultation at the request of Dr Tamera Punt for evaluation of recurrent UTI.    Pain described as "giving birth to bladder" with occasional sharp back pain Nocturia   Recent urine cultures: 02/08/23 > 100K pansensitive E. Coli. Rx macrobid 01/14/23 80K E. Coli resistant ampicillin, intermediate to Augmentin. Rx Keflex  ***Review of records significant for: ***DVT, endometrial cancer, rUTI, history of blood clots  Urinary Symptoms: Leaks urine with going from sitting to standing, with a full bladder, with movement to the bathroom, and with urgency Worsens when she has UTIs Leaks *** time(s) per {days/wks/mos/yrs:310907}. With constant moisture on pad Pad use: 2 liners/ mini-pads per day.   Patient is bothered by UI symptoms.  Day time voids 9.  Nocturia: 1-4 times per night to void. Voiding dysfunction:  does not empty bladder well.  Patient does not use a catheter to empty bladder.  When urinating, patient feels {urine symptoms:24756} Drinks: *** per day  UTIs: 3 UTI's in the last year, managed by A[facility] {ACTIONS;DENIES/REPORTS:21021675::"Denies"} history of blood in urine, pyelonephritis, bladder cancer, and kidney cancer Reports history of kidney stones *** No results found for the last 90 days.   Pelvic Organ Prolapse Symptoms:                  Patient Denies a feeling of a bulge the vaginal area.   Bowel Symptom: Bowel movements: 2-5 time(s) per {Time; day/week/month:13537} Stool consistency: loose Straining: no.  Splinting: no.  Incomplete evacuation: no.  Patient {denies/ admits to:24761} accidental bowel leakage / fecal incontinence  Occurs: *** time(s) per {Time;  day/week/month:13537}  Consistency with leakage: {stool consistency:24758} Bowel regimen: {bowel regimen:24759} Last colonoscopy: Date ***, Results *** multiple polyps [facility] Colonoscopy          Upcoming     Colonoscopy (Every 5 Years) Next due on 03/29/2028    03/30/2023  COLONOSCOPY   Only the first 1 history entries have been loaded, but more history exists.                Sexual Function Sexually active: yes.  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   Blood transfusion without reported diagnosis 02/21/1991   Dx'd with E.Coli   Cancer (HCC) 02/21/2015   endometrial   Chronic kidney disease    Clotting disorder (HCC)    Headache    History of kidney stones 02/20/2006   Hypertension 2017   Suspected   Neuromuscular disorder (HCC) 2020   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, hand   Oxygen deficiency    Pneumonia 02/20/2013   Tremor    left thumb     Past Surgical History:   Past Surgical History:  Procedure Laterality Date   ABDOMINAL HYSTERECTOMY  2017   COLONOSCOPY  02/2016   3 yr recall, Ileene Patrick   LYMPH NODE  BIOPSY Bilateral 07/13/2015   Procedure: SENTINEL LYMPH NODE BIOPSY;  Surgeon: Laurette Schimke, MD;  Location: [facility];  Service: Gynecology;  Laterality: Bilateral;   ROBOTIC ASSISTED TOTAL HYSTERECTOMY WITH BILATERAL SALPINGO OOPHERECTOMY Bilateral 07/13/2015   Procedure: XI ROBOTIC ASSISTED LAPAROSCOPIC TOTAL HYSTERECTOMY WITH BILATERAL SALPINGO OOPHORECTOMY;  Surgeon: Laurette Schimke, MD;  Location: [facility];  Service: Gynecology;  Laterality: Bilateral;    TONSILLECTOMY AND ADENOIDECTOMY     WISDOM TOOTH EXTRACTION       Past OB/GYN History: OB History  Gravida Para Term Preterm AB Living  7 6   1 6   SAB IAB Ectopic Multiple Live Births  1        # Outcome Date GA Lbr Len/2nd Weight Sex Type Anes PTL Lv  7 SAB           6 Para           5 Para           4 Para           3 Para           2 Para           1 Para             Vaginal deliveries: ***,  Forceps/ Vacuum deliveries: ***, Cesarean section: *** Menopausal: Yes, at age late 40s, Denies vaginal bleeding since menopause Contraception: s/p hysterectomy for endometrial cancer. Last pap smear was 2017 ***.  Any history of abnormal pap smears: {yes/no:19897}. No results found for: "DIAGPAP", "HPVHIGH", "ADEQPAP"  Medications: Patient has a current medication list which includes the following prescription(s): benzonatate, d3, cranberry, hydrochlorothiazide, lactase, multivitamin, multiple vitamins-minerals, mupirocin 2% oint-hydrocortisone 2.5% cream-nystatin cream-zinc oxide 13% oint 1:1:1:5 mixture, nystatin, phenazopyridine, and triamcinolone cream.   Allergies: Patient is allergic to kiwi extract and amoxicillin.   Social History:  Social History   Tobacco Use   Smoking status: Former    Current packs/day: 0.00    Average packs/day: 1.5 packs/day for 10.0 years (15.0 ttl pk-yrs)    Types: Cigarettes    Quit date: 07/04/1984    Years since quitting: 38.8   Smokeless tobacco: Former    Quit date: 02/21/1984   Tobacco comments:    I haven't smoked in 38 years  Vaping Use   Vaping status: Never Used  Substance Use Topics   Alcohol use: Yes    Alcohol/week: 1.0 standard drink of alcohol    Types: 1 Standard drinks or equivalent per week    Comment: Beer once a month in summer   Drug use: Never    Relationship status: married Patient lives with her husband.   Patient is employed as a Oceanographer. Regular exercise: No History of abuse: No  Family History:    Family History  Problem Relation Age of Onset   Hypertension Mother    Arthritis Mother    Stroke Mother    Varicose Veins Mother    Other Father        dec unknown cause age 55   Hypertension Father    Prostate cancer Father    Hearing loss Father    Hypertension Maternal Grandmother    Stroke Maternal Grandmother    Stomach cancer Maternal Grandmother    Miscarriages / Stillbirths Maternal Grandmother    Vision loss Maternal Grandmother    Alcohol abuse Maternal Grandfather    Varicose Veins Maternal Grandfather    Heart disease Paternal Grandmother    Miscarriages / [place]  Paternal Grandmother    Varicose Veins Paternal Grandmother    Heart disease Paternal Grandfather    Colon cancer Neg Hx    Rectal cancer Neg Hx    Esophageal cancer Neg Hx    Liver cancer Neg Hx    Colon polyps Neg Hx      Review of Systems: Review of Systems  Constitutional:  Positive for malaise/fatigue. Negative for fever and weight loss.       Weight gain  Respiratory:  Negative for cough, shortness of breath and wheezing.   Cardiovascular:  Positive for leg swelling. Negative for chest pain and palpitations.  Gastrointestinal:  Negative for abdominal pain, blood in stool and constipation.  Genitourinary:  Positive for dysuria, frequency and urgency. Negative for hematuria.       Leakage  Skin:  Negative for rash.  Neurological:  Positive for dizziness. Negative for weakness and headaches.  Endo/Heme/Allergies:  Does not bruise/bleed easily.       Hot flashes  Psychiatric/Behavioral:  Negative for depression. The patient is not nervous/anxious.      OBJECTIVE Physical Exam: There were no vitals filed for this visit.  Physical Exam Constitutional:      General: She is not in acute distress.    Appearance: Normal appearance.  Genitourinary:     Bladder and urethral meatus normal.     No lesions in the vagina.     Right Labia: No rash, tenderness, lesions, skin changes or  Bartholin's cyst.    Left Labia: No tenderness, lesions, skin changes, Bartholin's cyst or rash.    No vaginal discharge, erythema, tenderness, bleeding, ulceration or granulation tissue.      Right Adnexa: not tender, not full and no mass present.    Left Adnexa: not tender, not full and no mass present.    Cervix is absent.     Uterus is absent.     Urethral meatus caruncle not present.    No urethral prolapse, tenderness, mass, hypermobility or discharge present.     Bladder is not tender, urgency on palpation not present and masses not present.      Levator ani not tender, obturator internus not tender, no asymmetrical contractions present and no pelvic spasms present.    Anal wink present and BC reflex present. Cardiovascular:     Rate and Rhythm: Normal rate.  Pulmonary:     Effort: Pulmonary effort is normal. No respiratory distress.  Abdominal:     General: There is no distension.     Palpations: There is no mass.     Tenderness: There is no abdominal tenderness.     Hernia: No hernia is present.  Neurological:     Mental Status: She is alert.  Vitals reviewed. Exam conducted with a chaperone present.     POP-Q:   POP-Q                                               Aa                                               Ba  C                                                Gh                                               Pb                                               tvl                                                Ap                                               Bp                                                 D      Rectal Exam:  Normal sphincter tone, {rectocele:24766} distal rectocele, enterocoele {DESC; PRESENT/NOT PRESENT:21021351}, no rectal masses, {sign of:24767} dyssynergia when asking the patient to bear down.  Post-Void Residual (PVR) by Bladder Scan: In order to evaluate bladder emptying, we  discussed obtaining a postvoid residual and patient agreed to this procedure.  Procedure: The ultrasound unit was placed on the patient's abdomen in the suprapubic region after the patient had voided.      Laboratory Results: Lab Results  Component Value Date   COLORU yellow 02/08/2023   CLARITYU cloudy (A) 02/08/2023   GLUCOSEUR negative 02/08/2023   BILIRUBINUR negative 02/08/2023   KETONESU n 06/07/2015   SPECGRAV >=1.030 (A) 02/08/2023   RBCUR negative 02/08/2023   PHUR 6.5 02/08/2023   PROTEINUR =100 (A) 01/14/2023   UROBILINOGEN 0.2 02/08/2023   LEUKOCYTESUR Negative 02/08/2023    Lab Results  Component Value Date   CREATININE 0.65 03/22/2023   CREATININE 0.50 11/12/2022   CREATININE 0.72 11/26/2015    No results found for: "HGBA1C"  Lab Results  Component Value Date   HGB 13.3 11/12/2022     ASSESSMENT AND PLAN Ms. Pareja is a 68 y.o. with: No diagnosis found.  There are no diagnoses linked to this encounter.   Loleta Chance, MD

## 2023-05-08 ENCOUNTER — Ambulatory Visit: Payer: Medicare Other | Admitting: Obstetrics

## 2023-05-10 ENCOUNTER — Ambulatory Visit: Payer: Self-pay

## 2023-05-10 NOTE — Telephone Encounter (Signed)
  Chief Complaint: diarrhea Symptoms: severe diarrhea Frequency: about 4 days Pertinent Negatives: Patient denies fever, blood in stool, abx use in last 2 months Disposition: [] ED /[] Urgent Care (no appt availability in office) / [x] Appointment(In office/virtual)/ []  [place] [facility]/ [] Home Care/ [] Refused Recommended Disposition /[]  Mobile Bus/ []  Follow-up with PCP Additional Notes: Pt states that she started with burning in her abd that has now progressed to diarrhea.  Pt states that her  daughter had the same s/s when she visited the pt.  Denies abx  in the last 2 months. Pt states that she take azo often for UTIs. Pt sched for tomorrow, concerned about potential transmission to grandchildren April 1st.  Reason for Disposition  [1] SEVERE diarrhea (e.g., 7 or more times / day more than normal) AND [2] age > 60 years  SEVERE diarrhea (e.g., 7 or more times / day more than normal)  Answer Assessment - Initial Assessment Questions 1. DIARRHEA SEVERITY: "How bad is the diarrhea?" "How many more stools have you had in the past 24 hours than normal?"    - NO DIARRHEA (SCALE 0)   - MILD (SCALE 1-3): Few loose or mushy BMs; increase of 1-3 stools over normal daily number of stools; mild increase in ostomy output.   -  MODERATE (SCALE 4-7): Increase of 4-6 stools daily over normal; moderate increase in ostomy output.   -  SEVERE (SCALE 8-10; OR "WORST POSSIBLE"): Increase of 7 or more stools daily over normal; moderate increase in ostomy output; incontinence.     severe 2. ONSET: "When did the diarrhea begin?"      About 3-4 days 3. BM CONSISTENCY: "How loose or watery is the diarrhea?"      watery 4. VOMITING: "Are you also vomiting?" If Yes, ask: "How many times in the past 24 hours?"      denies 5. ABDOMEN PAIN: "Are you having any abdomen pain?" If Yes, ask: "What does it feel like?" (e.g., crampy, dull, intermittent, constant)      denies 6. ABDOMEN PAIN SEVERITY: If  present, ask: "How bad is the pain?"  (e.g., Scale 1-10; mild, moderate, or severe)   - MILD (1-3): doesn't interfere with normal activities, abdomen soft and not tender to touch    - MODERATE (4-7): interferes with normal activities or awakens from sleep, abdomen tender to touch    - SEVERE (8-10): excruciating pain, doubled over, unable to do any normal activities       denies 7. ORAL INTAKE: If vomiting, "Have you been able to drink liquids?" "How much liquids have you had in the past 24 hours?"     States that she is trying to drink I as much water as possible.  8. HYDRATION: "Any signs of dehydration?" (e.g., dry mouth [not just dry lips], too weak to stand, dizziness, new weight loss) "When did you last urinate?"     denies 9. EXPOSURE: "Have you traveled to a foreign country recently?" "Have you been exposed to anyone with diarrhea?" "Could you have eaten any food that was spoiled?"     denies 10. ANTIBIOTIC USE: "Are you taking antibiotics now or have you taken antibiotics in the past 2 months?"       denies 11. OTHER SYMPTOMS: "Do you have any other symptoms?" (e.g., fever, blood in stool) 101 with initial onset of diarrhea  Protocols used: Diarrhea-A-AH

## 2023-05-11 ENCOUNTER — Ambulatory Visit (INDEPENDENT_AMBULATORY_CARE_PROVIDER_SITE_OTHER): Admitting: Family Medicine

## 2023-05-11 ENCOUNTER — Encounter: Payer: Self-pay | Admitting: Family Medicine

## 2023-05-11 VITALS — BP 139/79 | HR 72 | Ht 63.0 in | Wt 213.0 lb

## 2023-05-11 DIAGNOSIS — R197 Diarrhea, unspecified: Secondary | ICD-10-CM

## 2023-05-11 NOTE — Patient Instructions (Signed)
 Viral Gastroenteritis, Adult  Viral gastroenteritis is also known as the stomach flu. This condition may affect your stomach, your small intestine, and your large intestine. It can cause sudden watery poop (diarrhea), fever, and vomiting. This condition is caused by certain germs (viruses). These germs can be passed from person to person very easily (are contagious). Having watery poop and vomiting can make you feel weak and cause you to not have enough water in your body (get dehydrated). This can make you tired and thirsty, make you have a dry mouth, and make it so you pee (urinate) less often. It is important to replace the fluids that you lose from having watery poop and vomiting. What are the causes? You can get sick by catching germs from other people. You can also get sick by: Eating food, drinking water, or touching a surface that has the germs on it (is contaminated). Sharing utensils or other personal items with a person who is sick. What increases the risk? Having a weak body defense system (immune system). Living with one or more children who are younger than 2 years. Living in a nursing home. Going on cruise ships. What are the signs or symptoms? Symptoms of this condition start suddenly. Symptoms may last for a few days or for as long as a week. Common symptoms include: Watery poop. Vomiting. Other symptoms include: Fever. Headache. Feeling tired (fatigue). Pain in the belly (abdomen). Chills. Feeling weak. Feeling like you may vomit (nauseous). Muscle aches. Not feeling hungry. How is this treated? This condition typically goes away on its own. The focus of treatment is to replace the fluids that you lose. This condition may be treated with: An ORS (oral rehydration solution). This is a drink that helps you replace fluids and minerals your body lost. It is sold at pharmacies and stores. Medicines to help with your symptoms. Probiotic supplements to reduce symptoms of  watery poop. Fluids given through an IV tube, if needed. Older adults and people with other diseases or a weak body defense system are at higher risk for not having enough water in the body. Follow these instructions at home: Eating and drinking  Take an ORS as told by your doctor. Drink clear fluids in small amounts as you are able. Clear fluids include: Water. Ice chips. Fruit juice that has water added to it (is diluted). Low-calorie sports drinks. Drink enough fluid to keep your pee (urine) pale yellow. Eat small amounts of healthy foods every 3-4 hours as you are able. This may include whole grains, fruits, vegetables, lean meats, and yogurt. Avoid fluids that have a lot of sugar or caffeine in them. This includes energy drinks, sports drinks, and soda. Avoid spicy or fatty foods. Avoid alcohol. General instructions  Wash your hands often. This is very important after you have watery poop or you vomit. If you cannot use soap and water, use hand sanitizer. Make sure that all people in your home wash their hands well and often. Take over-the-counter and prescription medicines only as told by your doctor. Rest at home while you get better. Watch your condition for any changes. Take a warm bath to help with any burning or pain from having watery poop. Keep all follow-up visits. Contact a doctor if: You cannot keep fluids down. Your symptoms get worse. You have new symptoms. You feel light-headed or dizzy. You have muscle cramps. Get help right away if: You have chest pain. You have trouble breathing, or you are breathing very fast.  You have a fast heartbeat. You feel very weak or you faint. You have a very bad headache, a stiff neck, or both. You have a rash. You have very bad pain, cramping, or bloating in your belly. Your skin feels cold and clammy. You feel mixed up (confused). You have pain when you pee. You have signs of not having enough water in the body, such  as: Dark pee, hardly any pee, or no pee. Cracked lips. Dry mouth. Sunken eyes. Feeling very sleepy. Feeling weak. You have signs of bleeding, such as: You see blood in your vomit. Your vomit looks like coffee grounds. You have bloody or black poop or poop that looks like tar. These symptoms may be an emergency. Get help right away. Call 911. Do not wait to see if the symptoms will go away. Do not drive yourself to the hospital. Summary Viral gastroenteritis is also known as the stomach flu. This condition can cause sudden watery poop (diarrhea), fever, and vomiting. These germs can be passed from person to person very easily. Take an ORS (oral rehydration solution) as told by your doctor. This is a drink that is sold at pharmacies and stores. Wash your hands often, especially after having watery poop or vomiting. If you cannot use soap and water, use hand sanitizer. This information is not intended to replace advice given to you by your health care provider. Make sure you discuss any questions you have with your health care provider. Document Revised: 12/06/2020 Document Reviewed: 12/06/2020 Elsevier Patient Education  2024 [employer].

## 2023-05-12 LAB — BASIC METABOLIC PANEL
BUN/Creatinine Ratio: 17 (ref 12–28)
BUN: 10 mg/dL (ref 8–27)
CO2: 25 mmol/L (ref 20–29)
Calcium: 9.3 mg/dL (ref 8.7–10.3)
Chloride: 98 mmol/L (ref 96–106)
Creatinine, Ser: 0.59 mg/dL (ref 0.57–1.00)
Glucose: 81 mg/dL (ref 70–99)
Potassium: 3.2 mmol/L — ABNORMAL LOW (ref 3.5–5.2)
Sodium: 140 mmol/L (ref 134–144)
eGFR: 99 mL/min/{1.73_m2} (ref >59)

## 2023-05-13 DIAGNOSIS — R197 Diarrhea, unspecified: Secondary | ICD-10-CM | POA: Insufficient documentation

## 2023-05-13 NOTE — Progress Notes (Signed)
 Kimberly Griffith - 68 y.o. female MRN [id number]  Date of birth: [birth year]  Subjective Chief Complaint  Patient presents with   Diarrhea    HPI Kimberly Griffith is a 68 y.o. female here today with complaint of diarrhea.  Symptoms started about 5 days ago.  She reports it started with some burning and cramping in her upper abdomen with some mild nausea.  Diarrhea started the next day.  There has been watery.  Unsure if she has had fever.  Denies chills.  No headaches or bodyaches.  No blood in her stool.  She does report that she was able to eat normally last night and has not had any bowel movements today so far.  Overall she is feeling little better.  Her daughter has had nearly identical symptoms as well.  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   Blood transfusion without reported diagnosis 02/21/1991   Dx'd with E.Coli   Cancer (HCC) 02/21/2015   endometrial   Chronic kidney disease    Clotting disorder (HCC)    Headache    History of kidney stones 02/20/2006   Hypertension 2017   Suspected   Neuromuscular disorder (HCC) 2020   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, hand   Oxygen deficiency    Pneumonia 02/20/2013   Tremor    left thumb    Past Surgical History:  Procedure Laterality Date   ABDOMINAL HYSTERECTOMY  2017   COLONOSCOPY  02/2016   3 yr recall, Ileene Patrick   LYMPH NODE BIOPSY Bilateral 07/13/2015   Procedure: SENTINEL LYMPH NODE BIOPSY;  Surgeon: Laurette Schimke, MD;  Location: [facility];  Service: Gynecology;  Laterality: Bilateral;   ROBOTIC ASSISTED TOTAL HYSTERECTOMY WITH BILATERAL SALPINGO OOPHERECTOMY Bilateral 07/13/2015   Procedure: XI ROBOTIC ASSISTED LAPAROSCOPIC TOTAL HYSTERECTOMY WITH  BILATERAL SALPINGO OOPHORECTOMY;  Surgeon: Laurette Schimke, MD;  Location: [facility];  Service: Gynecology;  Laterality: Bilateral;   TONSILLECTOMY AND ADENOIDECTOMY     WISDOM TOOTH EXTRACTION      Social History   Socioeconomic History   Marital status: Married    Spouse name: Not on file   Number of children: 6   Years of education: Not on file   Highest education level: Not on file  Occupational History   Occupation: Emergency planning/management officer  Tobacco Use   Smoking status: Former    Current packs/day: 0.00    Average packs/day: 1.5 packs/day for 10.0 years (15.0 ttl pk-yrs)    Types: Cigarettes    Quit date: 07/04/1984    Years since quitting: 38.8   Smokeless tobacco: Former    Quit date: 02/21/1984   Tobacco comments:    I haven't smoked in 38 years  Vaping Use   Vaping status: Never Used  Substance and Sexual Activity   Alcohol use: Yes    Alcohol/week: 1.0 standard drink of alcohol    Types: 1 Standard drinks or equivalent per week    Comment: Beer once a month in summer   Drug use: Never   Sexual activity: Yes    Partners: Male    Birth control/protection: Post-menopausal, Surgical  Other Topics Concern   Not on file  Social History Narrative   Not on file   Social Drivers of Health   Financial Resource Strain: Not on file  Food Insecurity: Not on file  Transportation Needs: Not on file  Physical Activity: Not on file  Stress: Not on file  Social Connections: Not on file    Family History  Problem Relation Age of Onset   Hypertension Mother    Arthritis Mother    Stroke Mother    Varicose Veins Mother    Other Father        dec unknown cause age 56   Hypertension Father    Prostate cancer Father    Hearing loss Father    Hypertension Maternal Grandmother    Stroke Maternal Grandmother    Stomach cancer Maternal Grandmother    Miscarriages / Stillbirths Maternal Grandmother    Vision loss Maternal Grandmother    Alcohol abuse Maternal Grandfather     Varicose Veins Maternal Grandfather    Heart disease Paternal Grandmother    Miscarriages / Stillbirths Paternal Grandmother    Varicose Veins Paternal Grandmother    Heart disease Paternal Grandfather    Colon cancer Neg Hx    Rectal cancer Neg Hx    Esophageal cancer Neg Hx    Liver cancer Neg Hx    Colon polyps Neg Hx     Health Maintenance  Topic Date Due   Medicare Annual Wellness (AWV)  Never done   COVID-19 Vaccine (1) Never done   Hepatitis C Screening  Never done   Zoster Vaccines- Shingrix (1 of 2) Never done   MAMMOGRAM  06/17/2017   DEXA SCAN  Never done   INFLUENZA VACCINE  05/21/2023 (Originally 09/21/2022)   DTaP/Tdap/Td (1 - Tdap) 12/20/2023 (Originally 11/30/1974)   Pneumonia Vaccine 5+ Years old (1 of 1 - PCV) 12/20/2023 (Originally 11/29/2020)   Colonoscopy  03/29/2028   HPV VACCINES  Aged Out     ----------------------------------------------------------------------------------------------------------------------------------------------------------------------------------------------------------------- Physical Exam BP 139/79 (BP Location: Left Arm, Patient Position: Sitting, Cuff Size: Large)   Pulse 72   Ht 5\' 3"  (1.6 m)   Wt 213 lb (96.6 kg)   LMP 02/21/2011 (Approximate)   SpO2 99%   BMI 37.73 kg/m   Physical Exam Constitutional:      Appearance: Normal appearance.  HENT:     Head: Normocephalic and atraumatic.  Eyes:     General: No scleral icterus. Cardiovascular:     Rate and Rhythm: Normal rate and regular rhythm.  Pulmonary:     Effort: Pulmonary effort is normal.     Breath sounds: Normal breath sounds.  Abdominal:     General: There is no distension.     Palpations: Abdomen is soft.     Tenderness: There is no abdominal tenderness.  Musculoskeletal:     Cervical back: Neck supple.  Neurological:     Mental Status: She is alert.  Psychiatric:        Mood and Affect: Mood normal.        Behavior: Behavior normal.      ------------------------------------------------------------------------------------------------------------------------------------------------------------------------------------------------------------------- Assessment and Plan  Diarrhea Her symptoms is not improving at this point she reports she has not had any loose stools so far today.  Likely viral etiology especially with other family numbers with similar symptoms..  Recommend continued supportive care with increase fluids.   No orders of the defined types were placed in this encounter.   No follow-ups on file.    This visit occurred during the SARS-CoV-2 public health  emergency.  Safety protocols were in place, including screening questions prior to the visit, additional usage of staff PPE, and extensive cleaning of exam room while observing appropriate contact time as indicated for disinfecting solutions.

## 2023-05-13 NOTE — Assessment & Plan Note (Signed)
 Her symptoms is not improving at this point she reports she has not had any loose stools so far today.  Likely viral etiology especially with other family numbers with similar symptoms..  Recommend continued supportive care with increase fluids.

## 2023-05-14 ENCOUNTER — Encounter: Payer: Self-pay | Admitting: Family Medicine

## 2023-05-18 ENCOUNTER — Other Ambulatory Visit: Payer: Self-pay | Admitting: Family Medicine

## 2023-05-24 NOTE — Telephone Encounter (Signed)
 Let message to return call to make sure she was still taking the medication. Last filled 2/3 with no refills. Roselyn Reef, CMA

## 2023-05-28 ENCOUNTER — Other Ambulatory Visit: Payer: Self-pay | Admitting: Family Medicine

## 2023-05-28 NOTE — Telephone Encounter (Signed)
 Copied from CRM [id number]. Topic: Clinical - Medication Refill >> May 28, 2023  3:14 PM Hector Shade B wrote: Most Recent Primary Care Visit:  Provider: Everrett Coombe  Department: [facility]  Visit Type: ACUTE  Date: 05/11/2023  Medication: hydrochlorothiazide (HYDRODIURIL) 25 MG tablet  Has the patient contacted their pharmacy? Yes (Agent: If no, request that the patient contact the pharmacy for the refill. If patient does not wish to contact the pharmacy document the reason why and proceed with request.) (Agent: If yes, when and what did the pharmacy advise?) Advised the prescription had been expired   Is this the correct pharmacy for this prescription? Yes If no, delete pharmacy and type the correct one.  This is the patient's preferred pharmacy:  [facility] [address], [place] - [address] [place] [address] Phone: [phone] Fax: [phone]   Has the prescription been filled recently? Yes  Is the patient out of the medication? No 2 to 3 days left  Has the patient been seen for an appointment in the last year OR does the patient have an upcoming appointment? Yes  Can we respond through MyChart? Yes  Agent: Please be advised that Rx refills may take up to 3 business days. We ask that you follow-up with your pharmacy.

## 2023-05-29 ENCOUNTER — Other Ambulatory Visit: Payer: Self-pay

## 2023-05-29 MED ORDER — HYDROCHLOROTHIAZIDE 25 MG PO TABS: 25.0000 mg | ORAL_TABLET | Freq: Every day | ORAL | 0 refills | Status: DC

## 2023-05-29 NOTE — Telephone Encounter (Signed)
 Forwarding message to Dr. Benjamin Stain covering Dr. Tamera Punt.  Patient requesting rx rf of  Hydrochlorothiazide 25mg   as 90 day supply  Last written 04/02/2023 by Dr. Tamera Punt Last OV 05/11/2023 Upcoming appt = none

## 2023-06-11 ENCOUNTER — Encounter: Payer: Self-pay | Admitting: Obstetrics and Gynecology

## 2023-06-11 ENCOUNTER — Ambulatory Visit (INDEPENDENT_AMBULATORY_CARE_PROVIDER_SITE_OTHER): Admitting: Obstetrics and Gynecology

## 2023-06-11 ENCOUNTER — Other Ambulatory Visit (HOSPITAL_COMMUNITY)
Admission: RE | Admit: 2023-06-11 | Discharge: 2023-06-11 | Disposition: A | Discharge status: Home / Self Care | Source: Other Acute Inpatient Hospital | Attending: Obstetrics and Gynecology | Admitting: Obstetrics and Gynecology

## 2023-06-11 VITALS — BP 119/79 | HR 84 | Ht 62.6 in | Wt 210.4 lb

## 2023-06-11 DIAGNOSIS — R152 Fecal urgency: Secondary | ICD-10-CM

## 2023-06-11 DIAGNOSIS — N3281 Overactive bladder: Secondary | ICD-10-CM

## 2023-06-11 DIAGNOSIS — R82998 Other abnormal findings in urine: Secondary | ICD-10-CM | POA: Diagnosis present

## 2023-06-11 DIAGNOSIS — M62838 Other muscle spasm: Secondary | ICD-10-CM | POA: Diagnosis not present

## 2023-06-11 DIAGNOSIS — R350 Frequency of micturition: Secondary | ICD-10-CM | POA: Diagnosis not present

## 2023-06-11 DIAGNOSIS — N952 Postmenopausal atrophic vaginitis: Secondary | ICD-10-CM

## 2023-06-11 LAB — POCT URINALYSIS DIPSTICK
Bilirubin, UA: NEGATIVE
Blood, UA: NEGATIVE
Glucose, UA: NEGATIVE
Ketones, UA: NEGATIVE
Nitrite, UA: NEGATIVE
Protein, UA: NEGATIVE
Spec Grav, UA: <=1.005 — AB (ref 1.010–1.025)
Urobilinogen, UA: 0.2 U/dL
pH, UA: 6 (ref 5.0–8.0)

## 2023-06-11 MED ORDER — NITROFURANTOIN MONOHYD MACRO 100 MG PO CAPS: 100.0000 mg | ORAL_CAPSULE | Freq: Every day | ORAL | 2 refills | Status: DC

## 2023-06-11 MED ORDER — ESTRADIOL 0.1 MG/GM VA CREA: 0.5000 g | TOPICAL_CREAM | VAGINAL | 11 refills | Status: DC

## 2023-06-11 MED ORDER — CYCLOBENZAPRINE HCL 5 MG PO TABS: 5.0000 mg | ORAL_TABLET | Freq: Every evening | ORAL | 2 refills | Status: AC

## 2023-06-11 MED ORDER — PSYLLIUM 95 % PO PACK: 1.0000 | PACK | Freq: Every day | ORAL | Status: AC

## 2023-06-11 NOTE — Progress Notes (Signed)
 [facility] New Patient Evaluation and Consultation  Referring Provider: Josepha Nickels, DO PCP: Josepha Nickels, DO Date of Service: 06/11/2023  SUBJECTIVE Chief Complaint: New Patient (Initial Visit) (Kimberly Griffith is a 68 y.o. female is here for recurrent UTI.)  History of Present Illness: Kimberly Griffith is a 68 y.o. White or Caucasian female seen in consultation at the request of Dr. Dianah Fort for evaluation of bladder pain, rUTI, possible prolapse.    Review of records significant for: Recent DVT in 2024  Urine cultures: + E.Coli 01/14/23 80K + E. Colie 02/08/23 >100K +Endometrial cancer Urinary Symptoms: Leaks urine with going from sitting to standing, with a full bladder, with movement to the bathroom, and with urgency Leaks 1-2 time(s) per days.  Pad use: 2 liners/ mini-pads per day.   Patient is bothered by UI symptoms.  Day time voids 6-9.  Nocturia: 2-4 times per night to void. Voiding dysfunction:  empties bladder well.  Patient does not use a catheter to empty bladder.  When urinating, patient feels she has no difficulties Drinks: Ensure AM, 16-24oz Water  per day  UTIs: 3 UTI's in the last year.   Reports history of kidney or bladder stones No results found for the last 90 days.   Pelvic Organ Prolapse Symptoms:                  Patient Denies a feeling of a bulge the vaginal area.   Bowel Symptom: Bowel movements: 1-5 time(s) per day Stool consistency: soft  Straining: no.  Splinting: no.  Incomplete evacuation: no.  Patient Denies accidental bowel leakage / fecal incontinence Bowel regimen: none Last colonoscopy: Date 03/30/23, Results 5 polyps removed [facility] Colonoscopy          Upcoming     Colonoscopy (Every 5 Years) Next due on 03/29/2028    03/30/2023  COLONOSCOPY   Only the first 1 history entries have been loaded, but more history exists.                Sexual Function Sexually active: yes.  Sexual orientation: Straight Pain with  sex: No  Pelvic Pain Admits to pelvic pain Location: The top of the vagina Pain occurs: with walking or standing or sitting for long periods. Also when she feels she has the UTI symptoms Prior pain treatment: Azo, Keflex , Macrobid  Improved by: Laying down for 15-20 minutes Worsened by: Continued standing or sitting   Past Medical History:  Past Medical History:  Diagnosis Date   Allergy    kiwi   Arthritis 2022   hands   Blood transfusion without reported diagnosis 02/21/1991   Dx'd with E.Coli   Cancer (HCC) 02/21/2015   endometrial   Chronic kidney disease    Clotting disorder (HCC)    Headache    History of kidney stones 02/20/2006   Hypertension 2017   Suspected   Neuromuscular disorder (HCC) 2020   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, hand   Oxygen deficiency    Pneumonia 02/20/2013   Tremor    left thumb     Past Surgical History:   Past Surgical History:  Procedure Laterality Date   ABDOMINAL HYSTERECTOMY  2017   COLONOSCOPY  02/2016   3 yr recall, [facility] Johnson   LYMPH NODE BIOPSY Bilateral 07/13/2015   Procedure: SENTINEL LYMPH NODE BIOPSY;  Surgeon: Terry Ficks, MD;  Location: [facility];  Service: Gynecology;  Laterality: Bilateral;   ROBOTIC ASSISTED TOTAL HYSTERECTOMY WITH BILATERAL SALPINGO OOPHERECTOMY Bilateral 07/13/2015  Procedure: XI ROBOTIC ASSISTED LAPAROSCOPIC TOTAL HYSTERECTOMY WITH BILATERAL SALPINGO OOPHORECTOMY;  Surgeon: Terry Ficks, MD;  Location: [facility];  Service: Gynecology;  Laterality: Bilateral;   TONSILLECTOMY AND ADENOIDECTOMY     WISDOM TOOTH EXTRACTION       Past OB/GYN History: [id number] Vaginal deliveries: 6,  Forceps/ Vacuum deliveries: 1, Cesarean section: 0 Menopausal: Yes, at age 71 Contraception: Hyst. Last pap smear was 2017.  Any history of abnormal pap smears: yes. HM PAP   This patient has no relevant Health Maintenance data.     Medications: Patient has a current medication list which includes the following  prescription(s): d3, cranberry, cyclobenzaprine , estradiol , hydrochlorothiazide , lactase, multivitamin, mupirocin 2% oint-hydrocortisone 2.5% cream-nystatin  cream-zinc  oxide 13% oint 1:1:1:5 mixture, nitrofurantoin  (macrocrystal-monohydrate), nystatin , phenazopyridine , and triamcinolone  cream, and the following Facility-Administered Medications: psyllium.   Allergies: Patient is allergic to kiwi extract and amoxicillin .   Social History:  Social History   Tobacco Use   Smoking status: Former    Current packs/day: 0.00    Average packs/day: 1.5 packs/day for 10.0 years (15.0 ttl pk-yrs)    Types: Cigarettes    Quit date: 07/04/1984    Years since quitting: 38.9   Smokeless tobacco: Former    Quit date: 02/21/1984   Tobacco comments:    I haven't smoked in 38 years  Vaping Use   Vaping status: Never Used  Substance Use Topics   Alcohol use: Yes    Alcohol/week: 1.0 standard drink of alcohol    Types: 1 Standard drinks or equivalent per week    Comment: Beer once a month in summer   Drug use: Never    Relationship status: married Patient lives with her Husband.   Patient is employed [employer]. Regular exercise: No History of abuse: No  Family History:   Family History  Problem Relation Age of Onset   Hypertension Mother    Arthritis Mother    Stroke Mother    Varicose Veins Mother    Other Father        dec unknown cause age 99   Hypertension Father    Prostate cancer Father    Hearing loss Father    Hypertension Maternal Grandmother    Stroke Maternal Grandmother    Stomach cancer Maternal Grandmother    Miscarriages / Stillbirths Maternal Grandmother    Vision loss Maternal Grandmother    Alcohol abuse Maternal Grandfather    Varicose Veins Maternal Grandfather    Heart disease Paternal Grandmother    Miscarriages / Stillbirths Paternal Grandmother    Varicose Veins Paternal Grandmother    Heart disease Paternal Grandfather    Colon cancer Neg Hx    Rectal cancer  Neg Hx    Esophageal cancer Neg Hx    Liver cancer Neg Hx    Colon polyps Neg Hx      Review of Systems: Review of Systems  Constitutional:  Positive for malaise/fatigue. Negative for chills and fever.       +Weight Gain  Respiratory:  Positive for cough. Negative for shortness of breath.   Cardiovascular:  Positive for leg swelling. Negative for chest pain and palpitations.  Gastrointestinal:  Negative for abdominal pain, blood in stool, constipation and diarrhea.  Genitourinary:  Positive for dysuria.  Skin:  Negative for rash.  Neurological:  Positive for headaches. Negative for weakness.  Endo/Heme/Allergies:        +Hot Flashes  Psychiatric/Behavioral:  Negative for depression and suicidal ideas.      OBJECTIVE Physical  Exam: Vitals:   06/11/23 0848  BP: 119/79  Pulse: 84  Weight: 210 lb 6.4 oz (95.4 kg)  Height: 5' 2.6" (1.59 m)    Physical Exam Constitutional:      Appearance: Normal appearance.  Pulmonary:     Effort: Pulmonary effort is normal.  Abdominal:     Palpations: Abdomen is soft.  Skin:    General: Skin is warm and dry.  Neurological:     General: No focal deficit present.     Mental Status: She is alert and oriented to person, place, and time.  Psychiatric:        Mood and Affect: Mood normal.        Behavior: Behavior normal. Behavior is cooperative.        Thought Content: Thought content normal.      GU / Detailed Urogynecologic Evaluation:  Pelvic Exam: Normal external female genitalia; Bartholin's and Skene's glands normal in appearance; urethral meatus normal in appearance, no urethral masses or discharge.   CST: negative   s/p hysterectomy: Speculum exam reveals normal vaginal mucosa with  atrophy and normal vaginal cuff.  Adnexa normal adnexa.    With apex supported, anterior compartment defect was reduced  Pelvic floor strength I/V  Pelvic floor musculature: Right levator non-tender, Right obturator tender, Left levator  tender, Left obturator tender  POP-Q:   POP-Q  -2                                            Aa   -2                                           Ba  -7                                              C   2.5                                            Gh  3                                            Pb  9.5                                            tvl   -3                                            Ap  -3  Bp                                                 D      Rectal Exam:  Normal external exam  Post-Void Residual (PVR) by Bladder Scan: In order to evaluate bladder emptying, we discussed obtaining a postvoid residual and patient agreed to this procedure.  Procedure: The ultrasound unit was placed on the patient's abdomen in the suprapubic region after the patient had voided.     Laboratory Results: Lab Results  Component Value Date   COLORU yellow 06/11/2023   CLARITYU cloudy 06/11/2023   GLUCOSEUR Negative 06/11/2023   BILIRUBINUR negative 06/11/2023   KETONESU negative 06/11/2023   SPECGRAV <=1.005 (A) 06/11/2023   RBCUR negative 06/11/2023   PHUR 6.0 06/11/2023   PROTEINUR Negative 06/11/2023   UROBILINOGEN 0.2 06/11/2023   LEUKOCYTESUR Small (1+) (A) 06/11/2023    Lab Results  Component Value Date   CREATININE 0.59 05/11/2023   CREATININE 0.65 03/22/2023   CREATININE 0.50 11/12/2022    No results found for: "HGBA1C"  Lab Results  Component Value Date   HGB 13.3 11/12/2022     ASSESSMENT AND PLAN Ms. Grumbine is a 68 y.o. with:  1. Urinary frequency   2. OAB (overactive bladder)   3. Leukocytes in urine   4. Levator spasm   5. Fecal urgency   6. Vaginal atrophy    We discussed the symptoms of overactive bladder (OAB), which include urinary urgency, urinary frequency, nocturia, with or without urge incontinence.  While we do not know the exact etiology of OAB, several treatment options exist. We  discussed management including behavioral therapy (decreasing bladder irritants, urge suppression strategies, timed voids, bladder retraining), physical therapy, medication; for refractory cases posterior tibial nerve stimulation, sacral neuromodulation, and intravesical botulinum toxin injection. Patient will consider the option of pelvic floor physical therapy. We did discuss that she needs to try and change her drinking habits to drink more water  during the day as her urine may be too concentrated and then she is drinking too close to bedtime and still in small amounts. There was no significant sign of prolapse based on POP-Q today which we discussed using the POP-Q tool and diagrams.  Will send urine for culture today to rule out UTI. She has not had any cultures done this year, but previous cultures have been +for E.coli.  Patient had significant pelvic floor dysfunction with spasms in the pelvic floor and tense muscles. She endorsed that trying to do a kegel sent pain into her pelvis. Overall she would benefit most from pelvic floor PT. She requested me to see if there is a pelvic floor PT in [place] as she lives in [place].  Will see if there is pelvic floor PT in [place].  Patient has significant fecal urgency and sometimes fecal leakage. She would benefit from stool bulking. We discussed starting Metamucil or benefiber to help her stools be more solid and bulked.  Patient has vaginal atrophy on exam. She would benefit from estrogen cream. Patient to use a blueberry sized amount into the vagina. She may use this nightly for 2 weeks and then twice weekly after. We discussed using her finger instead of using the applicator.   Patient to follow up in 3 months or sooner if needed.    Miamor Ayler G Erikah Thumm, NP

## 2023-06-11 NOTE — Patient Instructions (Addendum)
 For the recurrent UTI symptoms:  Add in D-Mannose this is the over the counter supplement that makes the bladder less sticky so bacteria cannot stick to it.   Probiotic with lactobacillus (Culturelle Women's urinary tract support works well)  Will start low dose antibiotic daily as well (Macrobid )  For the pelvic floor spasms and pain: Try to take a deep breath in and blow out like you are blowing through a straw when you pee. Please increase your water  intake to 2 water  bottles at work minimum 36-48oz at work. At night time you can try the low dose muscle relaxant to see if this helps your pelvic floor pain. We will also consider pelvic floor  PT.    For the bowel issues we need to bulk your stool up a little bit to make it easier for you to get to the bathroom and control the stool. Please consider adding in Metamucil or Benefiber daily for your bowels.    Vaginal atrophy: You have significant tissue atrophy. Please start estrogen cream 1gm nightly for 2 weeks and then decrease to 3 times a week. For intercourse you want to use a silicon base or blend lubrication.

## 2023-06-13 ENCOUNTER — Other Ambulatory Visit: Payer: Self-pay | Admitting: Obstetrics and Gynecology

## 2023-06-13 DIAGNOSIS — N390 Urinary tract infection, site not specified: Secondary | ICD-10-CM

## 2023-06-13 LAB — URINE CULTURE: Culture: >=100000 — AB

## 2023-06-13 MED ORDER — SULFAMETHOXAZOLE-TRIMETHOPRIM 800-160 MG PO TABS: 1.0000 | ORAL_TABLET | Freq: Two times a day (BID) | ORAL | 0 refills | Status: DC

## 2023-06-13 MED ORDER — NITROFURANTOIN MONOHYD MACRO 100 MG PO CAPS: 100.0000 mg | ORAL_CAPSULE | Freq: Two times a day (BID) | ORAL | 0 refills | Status: AC

## 2023-06-13 NOTE — Progress Notes (Signed)
 Please inform patient of +UTI. I am going to send in a separate script for Bactrim . She should pause the Macrobid  prophylaxis and do the 3 day course of Bactrim , then restart the prophylaxis.

## 2023-06-13 NOTE — Progress Notes (Signed)
 Macrobid  100mg  x2 daily for 5 days sent in as alternative

## 2023-06-23 ENCOUNTER — Other Ambulatory Visit: Payer: Self-pay | Admitting: Sports Medicine

## 2023-06-28 ENCOUNTER — Encounter: Payer: Self-pay | Admitting: Family Medicine

## 2023-09-05 ENCOUNTER — Other Ambulatory Visit: Payer: Self-pay | Admitting: Obstetrics and Gynecology

## 2023-09-06 ENCOUNTER — Other Ambulatory Visit: Payer: Self-pay

## 2023-09-06 MED ORDER — NITROFURANTOIN MONOHYD MACRO 100 MG PO CAPS: 100.0000 mg | ORAL_CAPSULE | Freq: Every day | ORAL | 2 refills | Status: DC

## 2023-09-10 ENCOUNTER — Ambulatory Visit (INDEPENDENT_AMBULATORY_CARE_PROVIDER_SITE_OTHER): Admitting: Obstetrics and Gynecology

## 2023-09-10 ENCOUNTER — Encounter: Payer: Self-pay | Admitting: Obstetrics and Gynecology

## 2023-09-10 VITALS — BP 142/73 | HR 65

## 2023-09-10 DIAGNOSIS — M62838 Other muscle spasm: Secondary | ICD-10-CM | POA: Diagnosis not present

## 2023-09-10 DIAGNOSIS — N3281 Overactive bladder: Secondary | ICD-10-CM

## 2023-09-10 DIAGNOSIS — R350 Frequency of micturition: Secondary | ICD-10-CM | POA: Diagnosis not present

## 2023-09-10 DIAGNOSIS — R152 Fecal urgency: Secondary | ICD-10-CM

## 2023-09-10 MED ORDER — ESTRADIOL 0.1 MG/GM VA CREA: 1.0000 g | TOPICAL_CREAM | VAGINAL | 11 refills | Status: AC

## 2023-09-10 NOTE — Patient Instructions (Signed)
 Call primary care at [facility] to get scheduled with a new PCP  Main: [phone]  Continue your estrogen x3 weekly for UTI support.

## 2023-09-10 NOTE — Progress Notes (Signed)
 [facility] Return Visit  SUBJECTIVE  History of Present Illness: Joletta Manner is a 68 y.o. female seen in follow-up for rUTI, levaor spasm, vaginal atrophy, and fecal leakage. Plan at last visit was start vaginal estrogen, take prophylaxis for UTI, and use estrogen cream x3 weekly after initial 2 weeks.   Patient reports things have improved. She feels like her vaginal tissues have improved. She is still having pelvic floor muscle  irritation but there was not pelvic floor PT in [place]. Has not had UTI symptoms.   Patient also has concerns she has not had a Dexa scan or mammogram as she does not have a female PCP.   Past Medical History: Patient  has a past medical history of Allergy, Arthritis (2022), Blood transfusion without reported diagnosis (02/21/1991), Cancer (HCC) (02/21/2015), Chronic kidney disease, Clotting disorder (HCC), Headache, History of kidney stones (02/20/2006), Hypertension (2017), Neuromuscular disorder (HCC) (2020), Oxygen deficiency, Pneumonia (02/20/2013), and Tremor.   Past Surgical History: She  has a past surgical history that includes Tonsillectomy and adenoidectomy; Wisdom tooth extraction; Robotic assisted total hysterectomy with bilateral salpingo oophorectomy (Bilateral, 07/13/2015); Lymph node biopsy (Bilateral, 07/13/2015); Abdominal hysterectomy (2017); and Colonoscopy (02/2016).   Medications: She has a current medication list which includes the following prescription(s): d3, cranberry, cyclobenzaprine , hydrochlorothiazide , lactase, multivitamin, mupirocin 2% oint-hydrocortisone 2.5% cream-nystatin  cream-zinc  oxide 13% oint 1:1:1:5 mixture, nitrofurantoin  (macrocrystal-monohydrate), nystatin , phenazopyridine , triamcinolone  cream, and estradiol , and the following Facility-Administered Medications: psyllium.   Allergies: Patient is allergic to kiwi extract, bactrim  [sulfamethoxazole -trimethoprim ], and amoxicillin .   Social  History: Patient  reports that she quit smoking about 39 years ago. Her smoking use included cigarettes. She has a 15 pack-year smoking history. She quit smokeless tobacco use about 39 years ago. She reports current alcohol use of about 1.0 standard drink of alcohol per week. She reports that she does not use drugs.     OBJECTIVE     Physical Exam: Vitals:   09/10/23 0806 09/10/23 0808  BP: (!) 142/81 (!) 142/73  Pulse: 74 65   Gen: No apparent distress, A&O x 3.  Detailed Urogynecologic Evaluation:  Deferred.    ASSESSMENT AND PLAN    Ms. Ehler is a 68 y.o. with:  1. Levator spasm   2. OAB (overactive bladder)   3. Urinary frequency   4. Fecal urgency    Referral placed for pelvic floor PT at [place] to help with her pelvic floor tension and her FI/OAB.  Improving. Has not been getting up as much at night.  Continue Macrobid  prophylaxis for another month and then discontinue. We will plan to monitor and see if she has another UTI. If we need to can consider starting Hiprex for support of UTI prevention.  Reports this has improved but has had x2 incidents of leakage. She has been taking metamucil which has helped some.   Patient to follow up in 6 months or sooner if needed. Encouraged her to call for an appointment if she has concerns of UTI. Information given to patient about primary care in [place] that is accepting new patients.   Eutimio Gharibian G Jordon Bourquin, NP

## 2023-09-21 ENCOUNTER — Ambulatory Visit (INDEPENDENT_AMBULATORY_CARE_PROVIDER_SITE_OTHER): Admitting: Family Medicine

## 2023-09-21 ENCOUNTER — Encounter: Payer: Self-pay | Admitting: Family Medicine

## 2023-09-21 VITALS — BP 122/78 | HR 74 | Temp 98.2°F | Ht 63.0 in | Wt 214.0 lb

## 2023-09-21 DIAGNOSIS — L03116 Cellulitis of left lower limb: Secondary | ICD-10-CM | POA: Diagnosis not present

## 2023-09-21 DIAGNOSIS — M79622 Pain in left upper arm: Secondary | ICD-10-CM | POA: Insufficient documentation

## 2023-09-21 DIAGNOSIS — I10 Essential (primary) hypertension: Secondary | ICD-10-CM | POA: Diagnosis not present

## 2023-09-21 DIAGNOSIS — Z13820 Encounter for screening for osteoporosis: Secondary | ICD-10-CM | POA: Diagnosis not present

## 2023-09-21 DIAGNOSIS — Z780 Asymptomatic menopausal state: Secondary | ICD-10-CM

## 2023-09-21 DIAGNOSIS — M810 Age-related osteoporosis without current pathological fracture: Secondary | ICD-10-CM | POA: Insufficient documentation

## 2023-09-21 DIAGNOSIS — Z7689 Persons encountering health services in other specified circumstances: Secondary | ICD-10-CM

## 2023-09-21 DIAGNOSIS — M79621 Pain in right upper arm: Secondary | ICD-10-CM | POA: Diagnosis not present

## 2023-09-21 MED ORDER — HYDROCHLOROTHIAZIDE 25 MG PO TABS: 25.0000 mg | ORAL_TABLET | Freq: Every day | ORAL | 1 refills | Status: AC

## 2023-09-21 MED ORDER — CEPHALEXIN 500 MG PO CAPS: 500.0000 mg | ORAL_CAPSULE | Freq: Two times a day (BID) | ORAL | 0 refills | Status: DC

## 2023-09-21 NOTE — Progress Notes (Signed)
 New Patient Office Visit  Subjective    Patient ID: Kimberly Griffith, female    DOB: 1956-02-12  Age: 68 y.o. MRN: [id number]  CC:  Chief Complaint  Patient presents with   Establish Care    HPI Kimberly Griffith presents to establish care with this practice. She is new to me. She is transfering care from previous PCP due to leaving the practice.   HTN:  Hydrochlorothiazide  25 mg daily  Recurrent UTI/pelvic floor dysfunction. Has appointment with PT for pelvic floor training.  Sees urogyn for estrogen treatment.  History of DVT: Left leg. Lower left leg with edema and redness. Treated with antibiotic in the past for cellulitis.  Present for years. May need vascular specialist for evaluation.   Pain in left arm: Needle pain in upper arm. Increased in shortness of breath.    Chart review: Sees urogyn Estradiol  1 gram vaginal 3 times per week Recurrent UTIs, levaor spasm, fecal leakage, over active bladder,  Macrobid  prophylaxis  BP 142/73     Outpatient Encounter Medications as of 09/21/2023  Medication Sig   cephALEXin  (KEFLEX ) 500 MG capsule Take 1 capsule (500 mg total) by mouth 2 (two) times daily.   Cholecalciferol (D3) 25 MCG (1000 UT) capsule Take 1,000 Units by mouth daily.   Cranberry 500 MG CAPS Take by mouth.   cyclobenzaprine  (FLEXERIL ) 5 MG tablet Take 1 tablet (5 mg total) by mouth at bedtime.   estradiol  (ESTRACE ) 0.1 MG/GM vaginal cream Place 1 g vaginally 3 (three) times a week.   hydrochlorothiazide  (HYDRODIURIL ) 25 MG tablet Take 1 tablet (25 mg total) by mouth daily.   Lactase 9000 units TABS Take by mouth.   Multiple Vitamin (MULTIVITAMIN) capsule Take 1 capsule by mouth daily. Over 50   mupirocin 2% oint-hydrocortisone 2.5% cream-nystatin  cream-zinc  oxide 13% oint 1:1:1:5 mixture Apply topically 2 (two) times daily as needed.   nitrofurantoin , macrocrystal-monohydrate, (MACROBID ) 100 MG capsule Take 1 capsule (100 mg total) by mouth daily.    nystatin  (MYCOSTATIN /NYSTOP ) powder Apply 1 Application topically 3 (three) times daily.   phenazopyridine  (PYRIDIUM ) 200 MG tablet Take 1 tablet (200 mg total) by mouth 3 (three) times daily as needed for pain.   triamcinolone  cream (KENALOG ) 0.5 % Apply 1 Application topically 2 (two) times daily. To affected areas.   [DISCONTINUED] hydrochlorothiazide  (HYDRODIURIL ) 25 MG tablet Take 1 tablet by mouth once daily   Facility-Administered Encounter Medications as of 09/21/2023  Medication   psyllium (HYDROCIL/METAMUCIL) 1 packet    Past Medical History:  Diagnosis Date   Allergy    kiwi   Arthritis 2022   hands   Blood transfusion without reported diagnosis 02/21/1991   Dx'd with E.Coli   Cancer (HCC) 02/21/2015   endometrial   Chronic kidney disease    Clotting disorder (HCC)    Headache    History of kidney stones 02/20/2006   Hypertension 2017   Suspected   Neuromuscular disorder (HCC) 2020   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, hand   Oxygen deficiency    Pneumonia 02/20/2013   Tremor    left thumb    Past Surgical History:  Procedure Laterality Date   ABDOMINAL HYSTERECTOMY  2017   COLONOSCOPY  02/2016   3 yr recall, Kimberly Griffith   LYMPH NODE BIOPSY Bilateral 07/13/2015   Procedure: SENTINEL LYMPH NODE BIOPSY;  Surgeon: Kimberly Bachelor, MD;  Location: [facility];  Service: Gynecology;  Laterality: Bilateral;   ROBOTIC ASSISTED TOTAL HYSTERECTOMY WITH BILATERAL SALPINGO OOPHERECTOMY Bilateral 07/13/2015  Procedure: XI ROBOTIC ASSISTED LAPAROSCOPIC TOTAL HYSTERECTOMY WITH BILATERAL SALPINGO OOPHORECTOMY;  Surgeon: Kimberly Bachelor, MD;  Location: [facility];  Service: Gynecology;  Laterality: Bilateral;   TONSILLECTOMY AND ADENOIDECTOMY     WISDOM TOOTH EXTRACTION      Family History  Problem Relation Age of Onset   Hypertension Mother    Arthritis Mother    Stroke Mother    Varicose Veins Mother    Other Father        dec unknown cause age 52   Hypertension Father    Prostate  cancer Father    Hearing loss Father    Hypertension Maternal Grandmother    Stroke Maternal Grandmother    Stomach cancer Maternal Grandmother    Miscarriages / Stillbirths Maternal Grandmother    Vision loss Maternal Grandmother    Alcohol abuse Maternal Grandfather    Varicose Veins Maternal Grandfather    Heart disease Paternal Grandmother    Miscarriages / Stillbirths Paternal Grandmother    Varicose Veins Paternal Grandmother    Heart disease Paternal Grandfather    Colon cancer Neg Hx    Rectal cancer Neg Hx    Esophageal cancer Neg Hx    Liver cancer Neg Hx    Colon polyps Neg Hx     Social History   Socioeconomic History   Marital status: Married    Spouse name: Kimberly Griffith   Number of children: 6   Years of education: Not on file   Highest education level: Griffith's degree (e.g., BA, AB, BS)  Occupational History   Occupation: Emergency planning/management officer  Tobacco Use   Smoking status: Former    Current packs/day: 0.00    Average packs/day: 1.5 packs/day for 10.0 years (15.0 ttl pk-yrs)    Types: Cigarettes    Quit date: 07/04/1984    Years since quitting: 39.2   Smokeless tobacco: Former    Quit date: 02/21/1984   Tobacco comments:    I haven't smoked in 38 years  Vaping Use   Vaping status: Never Used  Substance and Sexual Activity   Alcohol use: Yes    Alcohol/week: 1.0 standard drink of alcohol    Types: 1 Standard drinks or equivalent per week    Comment: Beer once a month in summer   Drug use: Never   Sexual activity: Yes    Partners: Male    Birth control/protection: Post-menopausal, Surgical  Other Topics Concern   Not on file  Social History Narrative   Not on file   Social Drivers of Health   Financial Resource Strain: Low Risk  (09/20/2023)   Overall Financial Resource Strain (CARDIA)    Difficulty of Paying Living Expenses: Not hard at all  Food Insecurity: No Food Insecurity (09/20/2023)   Hunger Vital Sign    Worried About Running Out of Food  in the Last Year: Never true    Ran Out of Food in the Last Year: Never true  Transportation Needs: No Transportation Needs (09/20/2023)   PRAPARE - Administrator, Civil Service (Medical): No    Lack of Transportation (Non-Medical): No  Physical Activity: Insufficiently Active (09/20/2023)   Exercise Vital Sign    Days of Exercise per Week: 3 days    Minutes of Exercise per Session: 20 min  Stress: Stress Concern Present (09/20/2023)   [employer] of Occupational Health - Occupational Stress Questionnaire    Feeling of Stress: To some extent  Social Connections: Moderately Integrated (09/20/2023)   Social Connection  and Isolation Panel    Frequency of Communication with Friends and Family: Three times a week    Frequency of Social Gatherings with Friends and Family: Once a week    Attends Religious Services: More than 4 times per year    Active Member of [employer] or Organizations: No    Attends Engineer, structural: Not on file    Marital Status: Married  Catering manager Violence: Not on file    ROS      Objective    BP 122/78 (BP Location: Right Arm, Patient Position: Sitting, Cuff Size: Large)   Pulse 74   Temp 98.2 F (36.8 C) (Oral)   Ht 5' 3 (1.6 m)   Wt 214 lb (97.1 kg)   LMP 02/21/2011 (Approximate)   SpO2 99%   BMI 37.91 kg/m   Physical Exam Vitals and nursing note reviewed.  Constitutional:      General: She is not in acute distress.    Appearance: Normal appearance.  Cardiovascular:     Rate and Rhythm: Normal rate and regular rhythm.     Heart sounds: Normal heart sounds.     Comments: Erythema present left lower extremity around ankle.  Pulmonary:     Effort: Pulmonary effort is normal.     Breath sounds: Normal breath sounds.  Musculoskeletal:     Left lower leg: 1+ Edema present.  Skin:    General: Skin is warm and dry.  Neurological:     General: No focal deficit present.     Mental Status: She is alert. Mental status  is at baseline.  Psychiatric:        Mood and Affect: Mood normal.        Behavior: Behavior normal.        Thought Content: Thought content normal.        Judgment: Judgment normal.         Assessment & Plan:   Problem List Items Addressed This Visit     Primary hypertension   Taking hydrochlorothiazide  25 mg daily. Well controlled in the office today. EKG: NSR rate 62 Refill sent. Follow-up in 6 months.      Relevant Medications   hydrochlorothiazide  (HYDRODIURIL ) 25 MG tablet   Other Relevant Orders   EKG 12-Lead   Basic metabolic panel with GFR   Encounter for osteoporosis screening in asymptomatic postmenopausal patient   Bone density ordered.  No fractures reported.       Relevant Orders   DG Bone Density   Establishing care with new doctor, encounter for - Primary   Cellulitis of left lower extremity   Recurrent condition. Treated with antibiotics in the past with some  improvement. There is swelling in left lower extremity with erythema around ankle.  Symptoms have been present for years.  No calf  pain. Keflex  500 mg BID for 10 days. If no improvement, send referral to vascular specialist.       Relevant Medications   cephALEXin  (KEFLEX ) 500 MG capsule   Pain in right upper arm   Symptoms described as needle pain in right upper arm that last for a short time. EKG: NSR Soft tissue ultrasound ordered.  Follow-up based on results of ultrasound.       Relevant Orders   EKG 12-Lead   US  RT UPPER EXTREM LTD SOFT TISSUE NON VASCULAR  Agrees with plan of care discussed.  Questions answered.   Return for Annual medicare wellness on phone/ every 6 months HTN .  Darice JONELLE Brownie, FNP

## 2023-09-21 NOTE — Assessment & Plan Note (Addendum)
 Taking hydrochlorothiazide  25 mg daily. Well controlled in the office today. EKG: NSR rate 62 Refill sent. Follow-up in 6 months.

## 2023-09-21 NOTE — Assessment & Plan Note (Signed)
 Bone density ordered.  No fractures reported.

## 2023-09-21 NOTE — Assessment & Plan Note (Addendum)
 Recurrent condition. Treated with antibiotics in the past with some  improvement. There is swelling in left lower extremity with erythema around ankle.  Symptoms have been present for years.  No calf  pain. Keflex  500 mg BID for 10 days. If no improvement, send referral to vascular specialist.

## 2023-09-21 NOTE — Assessment & Plan Note (Signed)
 Symptoms described as needle pain in right upper arm that last for a short time. EKG: NSR Soft tissue ultrasound ordered.  Follow-up based on results of ultrasound.

## 2023-09-22 LAB — BASIC METABOLIC PANEL WITH GFR
BUN/Creatinine Ratio: 15 (ref 12–28)
BUN: 10 mg/dL (ref 8–27)
CO2: 23 mmol/L (ref 20–29)
Calcium: 9.5 mg/dL (ref 8.7–10.3)
Chloride: 103 mmol/L (ref 96–106)
Creatinine, Ser: 0.67 mg/dL (ref 0.57–1.00)
Glucose: 108 mg/dL — ABNORMAL HIGH (ref 70–99)
Potassium: 4.1 mmol/L (ref 3.5–5.2)
Sodium: 142 mmol/L (ref 134–144)
eGFR: 96 mL/min/1.73 (ref >59)

## 2023-09-23 ENCOUNTER — Ambulatory Visit: Payer: Self-pay | Admitting: Family Medicine

## 2023-10-02 ENCOUNTER — Encounter: Payer: Self-pay | Admitting: Family Medicine

## 2023-10-02 ENCOUNTER — Ambulatory Visit (INDEPENDENT_AMBULATORY_CARE_PROVIDER_SITE_OTHER): Admitting: Family Medicine

## 2023-10-02 VITALS — BP 126/74 | HR 76 | Temp 98.2°F | Ht 64.0 in | Wt 215.0 lb

## 2023-10-02 DIAGNOSIS — Z0000 Encounter for general adult medical examination without abnormal findings: Secondary | ICD-10-CM | POA: Diagnosis not present

## 2023-10-02 NOTE — Progress Notes (Signed)
 Subjective:   Kimberly Griffith is a 68 y.o. female who presents for an Initial Medicare Annual Wellness Visit.  Visit Complete: In person  Patient Medicare AWV questionnaire was completed by the patient on 09/20/23; I have confirmed that all information answered by patient is correct and no changes since this date.  Cardiac Risk Factors include: advanced age (>46men, >44 women);obesity (BMI >30kg/m2)     Objective:    Today's Vitals   10/02/23 1525 10/02/23 1527  BP: 126/74   Pulse: 76   Temp: 98.2 F (36.8 C)   TempSrc: Oral   SpO2: 97%   Weight: 215 lb (97.5 kg)   Height: 5' 4 (1.626 m)   PainSc:  6    Body mass index is 36.9 kg/m.     10/02/2023    3:42 PM 11/12/2022    1:07 PM 11/24/2019    6:11 PM 02/16/2016    3:59 PM 10/14/2015    5:34 PM 07/13/2015    7:00 PM 07/13/2015    9:02 AM  Advanced Directives  Does Patient Have a Medical Advance Directive? No No No No  No  No    Would patient like information on creating a medical advance directive? No - Patient declined     No - patient declined information  No - patient declined information      Data saved with a previous flowsheet row definition     Current Medications (verified) Outpatient Encounter Medications as of 10/02/2023  Medication Sig   Cholecalciferol (D3) 25 MCG (1000 UT) capsule Take 1,000 Units by mouth daily.   Cranberry 500 MG CAPS Take by mouth.   estradiol  (ESTRACE ) 0.1 MG/GM vaginal cream Place 1 g vaginally 3 (three) times a week.   hydrochlorothiazide  (HYDRODIURIL ) 25 MG tablet Take 1 tablet (25 mg total) by mouth daily.   Lactase 9000 units TABS Take by mouth.   Multiple Vitamin (MULTIVITAMIN) capsule Take 1 capsule by mouth daily. Over 50   mupirocin 2% oint-hydrocortisone 2.5% cream-nystatin  cream-zinc  oxide 13% oint 1:1:1:5 mixture Apply topically 2 (two) times daily as needed.   nitrofurantoin , macrocrystal-monohydrate, (MACROBID ) 100 MG capsule Take 1 capsule (100 mg total) by mouth daily.    nystatin  (MYCOSTATIN /NYSTOP ) powder Apply 1 Application topically 3 (three) times daily.   phenazopyridine  (PYRIDIUM ) 200 MG tablet Take 1 tablet (200 mg total) by mouth 3 (three) times daily as needed for pain.   triamcinolone  cream (KENALOG ) 0.5 % Apply 1 Application topically 2 (two) times daily. To affected areas.   cephALEXin  (KEFLEX ) 500 MG capsule Take 1 capsule (500 mg total) by mouth 2 (two) times daily. (Patient not taking: Reported on 10/02/2023)   cyclobenzaprine  (FLEXERIL ) 5 MG tablet Take 1 tablet (5 mg total) by mouth at bedtime. (Patient not taking: Reported on 10/02/2023)   Facility-Administered Encounter Medications as of 10/02/2023  Medication   psyllium (HYDROCIL/METAMUCIL) 1 packet    Allergies (verified) Kiwi extract, Bactrim  [sulfamethoxazole -trimethoprim ], Amoxicillin , and Microplegia msa-msg [plegisol]   History: Past Medical History:  Diagnosis Date   Allergy    kiwi   Arthritis 2022   hands   Blood transfusion without reported diagnosis 02/21/1991   Dx'd with E.Coli   Cancer (HCC) 02/21/2015   endometrial   Chronic kidney disease    Clotting disorder (HCC)    Headache    History of kidney stones 02/20/2006   Hypertension 2017   Suspected   Neuromuscular disorder (HCC) 2020   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, hand   Pneumonia 02/20/2013  Tremor    left thumb   Past Surgical History:  Procedure Laterality Date   ABDOMINAL HYSTERECTOMY  2017   COLONOSCOPY  02/2016   3 yr recall, Elspeth Naval   [facility] NODE BIOPSY Bilateral 07/13/2015   Procedure: SENTINEL LYMPH NODE BIOPSY;  Surgeon: Sari Bachelor, MD;  Location: [facility];  Service: Gynecology;  Laterality: Bilateral;   ROBOTIC ASSISTED TOTAL HYSTERECTOMY WITH BILATERAL SALPINGO OOPHERECTOMY Bilateral 07/13/2015   Procedure: XI ROBOTIC ASSISTED LAPAROSCOPIC TOTAL HYSTERECTOMY WITH BILATERAL SALPINGO OOPHORECTOMY;  Surgeon: Sari Bachelor, MD;  Location: [facility];  Service: Gynecology;  Laterality: Bilateral;    TONSILLECTOMY AND ADENOIDECTOMY     WISDOM TOOTH EXTRACTION     Family History  Problem Relation Age of Onset   Hypertension Mother    Arthritis Mother    Stroke Mother    Varicose Veins Mother    Other Father        dec unknown cause age 25   Hypertension Father    Prostate cancer Father    Hearing loss Father    Hypertension Maternal Grandmother    Stroke Maternal Grandmother    Stomach cancer Maternal Grandmother    Miscarriages / Stillbirths Maternal Grandmother    Vision loss Maternal Grandmother    Alcohol abuse Maternal Grandfather    Varicose Veins Maternal Grandfather    Heart disease Paternal Grandmother    Miscarriages / Stillbirths Paternal Grandmother    Varicose Veins Paternal Grandmother    Heart disease Paternal Grandfather    Colon cancer Neg Hx    Rectal cancer Neg Hx    Esophageal cancer Neg Hx    Liver cancer Neg Hx    Colon polyps Neg Hx    Social History   Socioeconomic History   Marital status: Married    Spouse name: Tamar Miano   Number of children: 6   Years of education: Not on file   Highest education level: Bachelor's degree (e.g., BA, AB, BS)  Occupational History   Occupation: Emergency planning/management officer  Tobacco Use   Smoking status: Former    Current packs/day: 0.00    Types: Cigarettes    Quit date: 07/04/1984    Years since quitting: 39.2   Smokeless tobacco: Not on file  Vaping Use   Vaping status: Never Used  Substance and Sexual Activity   Alcohol use: Yes    Alcohol/week: 1.0 standard drink of alcohol    Types: 1 Standard drinks or equivalent per week    Comment: Beer once a month in summer   Drug use: Never   Sexual activity: Yes    Partners: Male    Birth control/protection: Post-menopausal, Surgical  Other Topics Concern   Not on file  Social History Narrative   Not on file   Social Drivers of Health   Financial Resource Strain: Low Risk  (10/02/2023)   Overall Financial Resource Strain (CARDIA)    Difficulty of  Paying Living Expenses: Not hard at all  Food Insecurity: No Food Insecurity (10/02/2023)   Hunger Vital Sign    Worried About Running Out of Food in the Last Year: Never true    Ran Out of Food in the Last Year: Never true  Transportation Needs: No Transportation Needs (10/02/2023)   PRAPARE - Administrator, Civil Service (Medical): No    Lack of Transportation (Non-Medical): No  Physical Activity: Insufficiently Active (10/02/2023)   Exercise Vital Sign    Days of Exercise per Week: 3 days  Minutes of Exercise per Session: 20 min  Stress: Stress Concern Present (10/02/2023)   [employer] of Occupational Health - Occupational Stress Questionnaire    Feeling of Stress: To some extent  Social Connections: Socially Integrated (10/02/2023)   Social Connection and Isolation Panel    Frequency of Communication with Friends and Family: Three times a week    Frequency of Social Gatherings with Friends and Family: Once a week    Attends Religious Services: More than 4 times per year    Active Member of Clubs or Organizations: No    Attends Engineer, structural: More than 4 times per year    Marital Status: Married    Tobacco Counseling Counseling given: No   Clinical Intake:  Pre-visit preparation completed: No  Pain : 0-10 Pain Score: 6  Pain Type: Acute pain Pain Location: Back Pain Orientation: Right Pain Descriptors / Indicators: Sharp Pain Onset: In the past 7 days Pain Frequency: Intermittent Pain Relieving Factors: nothing Effect of Pain on Daily Activities: taking it easy for now  Pain Relieving Factors: nothing  BMI - recorded: 37 Nutritional Status: BMI > 30  Obese Nutritional Risks: None Diabetes: No  How often do you need to have someone help you when you read instructions, pamphlets, or other written materials from your doctor or pharmacy?: 1 - Never What is the last grade level you completed in school?: 16  Interpreter Needed?:  No      Activities of Daily Living    10/02/2023    3:30 PM 10/02/2023    7:51 AM  In your present state of health, do you have any difficulty performing the following activities:  Hearing? 0 0  Vision? 0 0  Difficulty concentrating or making decisions? 0 0  Walking or climbing stairs? 0 0  Dressing or bathing? 0 0  Doing errands, shopping? 0 0  Preparing Food and eating ? N N  Using the Toilet? N N  In the past six months, have you accidently leaked urine? N N  Do you have problems with loss of bowel control? N N  Managing your Medications? N N  Managing your Finances? N N  Housekeeping or managing your Housekeeping? N N    Patient Care Team: Booker Darice SAUNDERS, FNP as PCP - General (Family Medicine) Zuleta, K. UroGYN Yan, Y, neurology  Armbruster, CAND[facility]    Indicate any recent Medical Services you may have received from other than [facility] providers in the past year (date may be approximate).     Assessment:   This is a routine wellness examination for Lorisa.  Hearing/Vision screen Hearing Screening - Comments:: Grossly intact.  Vision Screening - Comments:: Wears reading glasses.    Goals Addressed             This Visit's Progress    Set My Weight Loss Goal         Notes:  wants to lose 40-50 pounds as goal. Strive for 15 pounds this year.       Depression Screen    10/02/2023    3:41 PM 04/05/2023    1:20 PM 12/20/2022    9:02 AM 11/26/2015    2:59 PM 06/19/2015    9:55 AM 04/14/2015    5:29 PM 03/09/2015    8:20 AM  PHQ 2/9 Scores  PHQ - 2 Score 0 0 0 0 0 0 0  PHQ- 9 Score   3        Fall  Risk    10/02/2023    3:43 PM 10/02/2023    7:51 AM 04/05/2023    1:20 PM 12/20/2022    9:02 AM 11/26/2015    2:59 PM  Fall Risk   Falls in the past year? 1 1 0 0 No   Number falls in past yr: 0 0 0 0   Injury with Fall? 1 1 0 0   Risk for fall due to : History of fall(s)  No Fall Risks No Fall Risks   Risk for fall due to: Comment tripped in yard       Follow up Falls evaluation completed  Falls evaluation completed Falls evaluation completed      Data saved with a previous flowsheet row definition     MEDICARE RISK AT HOME: Medicare Risk at Home Any stairs in or around the home?: (Patient-Rptd) Yes If so, are there any without handrails?: No Home free of loose throw rugs in walkways, pet beds, electrical cords, etc?: Yes Adequate lighting in your home to reduce risk of falls?: Yes Life alert?: No Use of a cane, walker or w/c?: No Grab bars in the bathroom?: No Shower chair or bench in shower?: No Elevated toilet seat or a handicapped toilet?: No  TIMED UP AND GO:  Was the test performed? Yes  Length of time to ambulate 10 feet: 10 sec Gait steady and fast without use of assistive device    Cognitive Function:        10/02/2023    3:45 PM  6CIT Screen  What Year? 0 points  What month? 0 points  What time? 0 points  Count back from 20 0 points  Months in reverse 0 points  Repeat phrase 0 points  Total Score 0 points    Immunizations Immunization History  Administered Date(s) Administered   Influenza Split 12/08/2011    TDAP status: Due, Education has been provided regarding the importance of this vaccine. Advised may receive this vaccine at local pharmacy or Health Dept. Aware to provide a copy of the vaccination record if obtained from local pharmacy or Health Dept. Verbalized acceptance and understanding.  Flu Vaccine status: Up to date. Flu season not started yet.   Pneumococcal vaccine status: Due, Education has been provided regarding the importance of this vaccine. Advised may receive this vaccine at local pharmacy or Health Dept. Aware to provide a copy of the vaccination record if obtained from local pharmacy or Health Dept. Verbalized acceptance and understanding.  Covid-19 vaccine status: Declined, Education has been provided regarding the importance of this vaccine but patient still declined.  Advised may receive this vaccine at local pharmacy or Health Dept.or vaccine clinic. Aware to provide a copy of the vaccination record if obtained from local pharmacy or Health Dept. Verbalized acceptance and understanding.  Qualifies for Shingles Vaccine? No   Zostavax completed No   Shingrix Completed?: No.    Education has been provided regarding the importance of this vaccine. Patient has been advised to call insurance company to determine out of pocket expense if they have not yet received this vaccine. Advised may also receive vaccine at local pharmacy or Health Dept. Verbalized acceptance and understanding.  Screening Tests Health Maintenance  Topic Date Due   COVID-19 Vaccine (1) Never done   Hepatitis C Screening  Never done   Zoster Vaccines- Shingrix (1 of 2) Never done   MAMMOGRAM  06/17/2017   DEXA SCAN  Never done   INFLUENZA VACCINE  09/21/2023  DTaP/Tdap/Td (1 - Tdap) 12/20/2023 (Originally 11/30/1974)   Pneumococcal Vaccine: 50+ Years (1 of 1 - PCV) 12/20/2023 (Originally 11/29/2005)   Medicare Annual Wellness (AWV)  10/01/2024   Colonoscopy  03/29/2028   Hepatitis B Vaccines  Aged Out   HPV VACCINES  Aged Out   Meningococcal B Vaccine  Aged Out    Health Maintenance  Health Maintenance Due  Topic Date Due   COVID-19 Vaccine (1) Never done   Hepatitis C Screening  Never done   Zoster Vaccines- Shingrix (1 of 2) Never done   MAMMOGRAM  06/17/2017   DEXA SCAN  Never done   INFLUENZA VACCINE  09/21/2023    Colorectal cancer screening: Type of screening: Colonoscopy. Completed 04/19/23. Repeat every 5 years  Mammogram status: Ordered 04/06/23. Pt provided with contact info and advised to call to schedule appt.   Bone Density status: Ordered recently, scheduled in September. Pt provided with contact info and advised to call to schedule appt.  Lung Cancer Screening: (Low Dose CT Chest recommended if Age 41-80 years, 20 pack-year currently smoking OR have quit w/in  15years.) does not qualify.   Lung Cancer Screening Referral: n/a   Additional Screening:  Hepatitis C Screening: does not qualify; Completed does not want.   Vision Screening: Recommended annual ophthalmology exams for early detection of glaucoma and other disorders of the eye. Is the patient up to date with their annual eye exam?  Yes  Who is the provider or what is the name of the office in which the patient attends annual eye exams? Provider in [place] If pt is not established with a provider, would they like to be referred to a provider to establish care? No .   Dental Screening: Recommended annual dental exams for proper oral hygiene  Diabetic Foot Exam: n/a   Community Resource Referral / Chronic Care Management: CRR required this visit?  No   CCM required this visit?  No     Plan:     I have personally reviewed and noted the following in the patient's chart:   Medical and social history Use of alcohol, tobacco or illicit drugs  Current medications and supplements including opioid prescriptions. Patient is currently taking opioid prescriptions. Information provided to patient regarding non-opioid alternatives. Patient advised to discuss non-opioid treatment plan with their provider. Functional ability and status Nutritional status Physical activity Advanced directives: does not want information  today  List of other physicians: done  Hospitalizations, surgeries, and ER visits in previous 12 months: September 2024: DVT  Vitals Screenings to include cognitive, depression, and falls: fell recently.  Referrals and appointments: placed at earlier.   In addition, I have reviewed and discussed with patient certain preventive protocols, quality metrics, and best practice recommendations. A written personalized care plan for preventive services as well as general preventive health recommendations were provided to patient.     Darice JONELLE Brownie, FNP   10/02/2023   After Visit  Summary: (In Person-Printed) AVS printed and given to the patient  Nurse Notes: Recommend vaccines. Considering Shingrix.

## 2023-10-04 ENCOUNTER — Ambulatory Visit (HOSPITAL_BASED_OUTPATIENT_CLINIC_OR_DEPARTMENT_OTHER)
Admission: RE | Admit: 2023-10-04 | Discharge: 2023-10-04 | Disposition: A | Discharge status: Home / Self Care | Source: Ambulatory Visit | Attending: Family Medicine | Admitting: Family Medicine

## 2023-10-04 DIAGNOSIS — M79621 Pain in right upper arm: Secondary | ICD-10-CM | POA: Insufficient documentation

## 2023-10-23 ENCOUNTER — Encounter: Payer: Self-pay | Admitting: Sports Medicine

## 2023-10-25 ENCOUNTER — Ambulatory Visit: Attending: Obstetrics and Gynecology

## 2023-10-25 ENCOUNTER — Other Ambulatory Visit: Payer: Self-pay

## 2023-10-25 DIAGNOSIS — R102 Pelvic and perineal pain: Secondary | ICD-10-CM | POA: Diagnosis present

## 2023-10-25 DIAGNOSIS — M5459 Other low back pain: Secondary | ICD-10-CM | POA: Insufficient documentation

## 2023-10-25 DIAGNOSIS — M6281 Muscle weakness (generalized): Secondary | ICD-10-CM | POA: Insufficient documentation

## 2023-10-25 DIAGNOSIS — R279 Unspecified lack of coordination: Secondary | ICD-10-CM | POA: Insufficient documentation

## 2023-10-25 NOTE — Patient Instructions (Signed)
Urge Incontinence  Ideal urination frequency is every 2-4 wakeful hours, which equates to 5-8 times within a 24-hour period.   Urge incontinence is leakage that occurs when the bladder muscle contracts, creating a sudden need to go before getting to the bathroom.   Going too often when your bladder isn't actually full can disrupt the body's automatic signals to store and hold urine longer, which will increase urgency/frequency.  In this case, the bladder "is running the show" and strategies can be learned to retrain this pattern.   One should be able to control the first urge to urinate, at around 150mL.  The bladder can hold up to a "grande latte," or 400mL. To help you gain control, practice the Urge Drill below when urgency strikes.  This drill will help retrain your bladder signals and allow you to store and hold urine longer.  The overall goal is to stretch out your time between voids to reach a more manageable voiding schedule.    Practice your "quick flicks" often throughout the day (each waking hour) even when you don't need feel the urge to go.  This will help strengthen your pelvic floor muscles, making them more effective in controlling leakage.  Urge Drill  When you feel an urge to go, follow these steps to regain control: Stop what you are doing and be still Take one deep breath, directing your air into your abdomen Think an affirming thought, such as "I've got this." Do 5 quick flicks of your pelvic floor Walk with control to the bathroom to void, or delay voiding        The knack: Use this technique while coughing, laughing, sneezing, or with any activities that causes you to leak urine a little. Right before you perform one of these activities that increase pressure in the abdomen and pushes a little urine out, perform a pelvic floor muscle contraction and hold. If that does not completely stop the leaking, try tightening your thighs together in addition to performing a  pelvic floor muscle contraction. Make sure you are not trying to stifle a cough, sneeze, or laugh; allow these activities in full as it will cause less pressure down into the bladder and pelvic floor muscles.       [facility] [address], [address] [place], [place] [address] Phone # [phone] Fax [phone]  

## 2023-10-25 NOTE — Therapy (Signed)
 OUTPATIENT PHYSICAL THERAPY FEMALE PELVIC EVALUATION   Patient Name: Glendon Fiser MRN: [id number] DOB:1955-02-27, 68 y.o., female Today's Date: 10/25/2023  END OF SESSION:  PT End of Session - 10/25/23 1145     Visit Number 1    Date for PT Re-Evaluation 01/17/24    Authorization Type Medicare, Kx at 15    Progress Note Due on Visit 10    PT Start Time 1145    PT Stop Time 1225    PT Time Calculation (min) 40 min    Activity Tolerance Patient tolerated treatment well    Behavior During Therapy [facility] for tasks assessed/performed          Past Medical History:  Diagnosis Date   Allergy    kiwi   Arthritis 2022   hands   Blood transfusion without reported diagnosis 02/21/1991   Dx'd with E.Coli   Cancer (HCC) 02/21/2015   endometrial   Chronic kidney disease    Clotting disorder (HCC)    Headache    History of kidney stones 02/20/2006   Hypertension 2017   Suspected   Neuromuscular disorder (HCC) 2020   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, hand   Pneumonia 02/20/2013   Tremor    left thumb   Past Surgical History:  Procedure Laterality Date   ABDOMINAL HYSTERECTOMY  2017   COLONOSCOPY  02/2016   3 yr recall, Elspeth Naval   LYMPH NODE BIOPSY Bilateral 07/13/2015   Procedure: SENTINEL LYMPH NODE BIOPSY;  Surgeon: Sari Bachelor, MD;  Location: [facility];  Service: Gynecology;  Laterality: Bilateral;   ROBOTIC ASSISTED TOTAL HYSTERECTOMY WITH BILATERAL SALPINGO OOPHERECTOMY Bilateral 07/13/2015   Procedure: XI ROBOTIC ASSISTED LAPAROSCOPIC TOTAL HYSTERECTOMY WITH BILATERAL SALPINGO OOPHORECTOMY;  Surgeon: Sari Bachelor, MD;  Location: [facility];  Service: Gynecology;  Laterality: Bilateral;   TONSILLECTOMY AND ADENOIDECTOMY     WISDOM TOOTH EXTRACTION     Patient Active Problem List   Diagnosis Date Noted   Cellulitis of left lower extremity 09/21/2023   Left upper arm pain 09/21/2023   Osteoporosis, 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   Pain in right upper arm 09/21/2023   Diarrhea  05/13/2023   Vitreous floaters of right eye 04/05/2023   Recurrent UTI 02/08/2023   Acute cystitis without hematuria 02/08/2023   Candidiasis 12/20/2022   Encounter for osteoporosis screening in asymptomatic postmenopausal patient 12/20/2022   Establishing care with new doctor, encounter for 12/20/2022   Primary hypertension 12/06/2022   Tremor of left hand 12/06/2022   Acute deep vein thrombosis (DVT) of popliteal vein of left lower extremity (HCC) 11/20/2022   Foot sprain, left, subsequent encounter 11/28/2019   Endometrial cancer (HCC) 07/05/2015    PCP: Booker Darice SAUNDERS, FNP  REFERRING PROVIDER: Zuleta, Kaitlin G, NP   REFERRING DIAG: [phone] (ICD-10-CM) - Levator spasm N32.81 (ICD-10-CM) - OAB (overactive bladder) R35.0 (ICD-10-CM) - Urinary frequency R15.2 (ICD-10-CM) - Fecal urgency  THERAPY DIAG:  Muscle weakness (generalized)  Unspecified lack of coordination  Pelvic pain  Other low back pain  Rationale for Evaluation and Treatment: Rehabilitation  ONSET DATE: 2022  SUBJECTIVE:  SUBJECTIVE STATEMENT: Pt states that in the last couple of years she has had an increase in urinary leaking. When she is walking, especially on hard floors, she feels a lot of pain and pressure in vagina. She feels like this happened after hysterectomy for endometrial cancer. She has started taking estradiol  cream 3x/week.    PAIN:  Are you having pain? Yes NPRS scale: 10/10 - been better since starting estrogen Pain location: Vaginal  Pain type: aching, burning, throbbing, and bulging Pain description: intermittent   Aggravating factors: walking, especially on hard surfaces Relieving factors: lying down  PRECAUTIONS: Other: hx endometrial cancer  RED FLAGS: None   WEIGHT BEARING RESTRICTIONS:  No  FALLS:  Has patient fallen in last 6 months? No  OCCUPATION: admin  ACTIVITY LEVEL : walking on treadmill   PLOF: Independent  PATIENT GOALS: decrease vaginal pain   PERTINENT HISTORY:  Abdominal hysterectomy 2017, hx endometrial cancer Sexual abuse: No  BOWEL MOVEMENT: Pain with bowel movement: No Type of bowel movement:Type (Bristol Stool Scale) 4-7, Frequency 1-4x/day, and Strain no Fully empty rectum: Yes:   Leakage: Yes: but not since starting metamucil; she still has strong urgency Pads: Yes: 1 - more just in case Fiber supplement/laxative Yes - taking fiber  URINATION: Pain with urination: No Fully empty bladder: Yes: usually Stream: sometimes hard to start Urgency: yes, but getting better Frequency: 3-4x/day; she has timed fluids differently and now large improvement in nocturia, even slept through the night several times Fluid Intake: trying to get 60 oz a day Leakage: Urge to void, Walking to the bathroom, Coughing, Sneezing, Laughing, and Exercise Pads: Yes: just in case with small dribbles *history of frequent UTIs   INTERCOURSE:  Ability to have vaginal penetration Yes  Pain with intercourse: none DrynessYes  Climax: WNL Marinoff Scale: 0/3 Lubricant: sometimes   PREGNANCY: Vaginal deliveries 6 Tearing Yes: tear with second Episiotomy No C-section deliveries 0 Currently pregnant No  PROLAPSE: Pressure and Bulge   OBJECTIVE:  Note: Objective measures were completed at Evaluation unless otherwise noted.  10/25/23 PATIENT SURVEYS:   PFIQ-7: 67 (bladder)  COGNITION: Overall cognitive status: Within functional limits for tasks assessed     SENSATION: Light touch: Appears intact   FUNCTIONAL TESTS:  Squat: Lt weight shift Single leg stance:  Rt: pelvic drop with UE hand hold  Lt: pelvic drop with UE hand hold  Curl-up test: significant difficulty and abdominal doming   GAIT: Assistive device utilized: None Comments: forward  flexed posture, decreased hip extension   POSTURE: rounded shoulders, forward head, decreased lumbar lordosis, increased thoracic kyphosis, posterior pelvic tilt, and Lt lower thoracic curvature   LUMBARAROM/PROM:  A/PROM A/PROM  Eval (% available)  Flexion 75  Extension 25  Right lateral flexion 50  Left lateral flexion 50  Right rotation 50  Left rotation 50   (Blank rows = not tested)  PALPATION:   General: tightness throughout thoracic and lumbar paraspinals  Pelvic Alignment: posterior pelvic tilt  Abdominal: tenderness throughout bil lower abdomen; increased sternocostal angle                External Perineal Exam: white areas on labia - contacted medical provided about checking for lichens sclerosis                             Internal Pelvic Floor: muscle atrophy throughout bil levator ani  Patient confirms identification and approves PT to assess internal pelvic  floor and treatment Yes  PELVIC MMT:   MMT eval  Vaginal 2/5, 6 second endurance, 5 repeat contractions  Diastasis Recti 4 finger widths  (Blank rows = not tested)        TONE: Low tone  PROLAPSE: Grade 1 vaginal wall laxity  TODAY'S TREATMENT:                                                                                                                              DATE:  10/25/23 EVAL  Neuromuscular re-education: Pt provides verbal consent for internal vaginal/rectal pelvic floor exam. Internal vaginal pelvic floor muscle contraction training Quick flicks Long holds Urge drill The knack   PATIENT EDUCATION:  Education details: See above Person educated: Patient Education method: Explanation, Demonstration, Tactile cues, Verbal cues, and Handouts Education comprehension: verbalized understanding  HOME EXERCISE PROGRAM: [id number]  ASSESSMENT:  CLINICAL IMPRESSION: Patient is a 68 y.o. female who was seen today for physical therapy evaluation and treatment for vaginal pain and urinary  and fecal urgency/incontinence. Exam findings notable for abnormal posture and gait, decreased lumbar A/ROM, pelvic drop in supported single leg stance, core weakness with diastasis recti and distortion, lower abdominal tenderness, increased sternocostal angle, pelvic floor muscle weakness, decreased pelvic floor muscle endurance, tenderness throughout pelvic floor muscles. and mild anterior vaginal wall laxity; vulva had white/pale areas and pt reports consistent itching that has been present for long period of time - we reached out to medical team to get appointment to assess for lichens sclerosis. Signs and symptoms are most consistent with pelvic floor muscle weakness, poor bladder habits, and poor abdominal pressure management; possible vulvar condition may be contributing to discomfort throughout the day. Initial treatment consisted of pelvic floor muscle contraction training, urge drill, and the knack. She will continue to benefit from skilled PT intervention in order to decrease vaginal pain, improve fecal/urinary urgency/incontinence, address impairments, and improve quality of life.   OBJECTIVE IMPAIRMENTS: decreased activity tolerance, decreased coordination, decreased endurance, decreased mobility, decreased ROM, decreased strength, increased fascial restrictions, increased muscle spasms, impaired flexibility, impaired tone, improper body mechanics, postural dysfunction, and pain.   ACTIVITY LIMITATIONS: standing, continence, and locomotion level  PARTICIPATION LIMITATIONS: cleaning, laundry, community activity, occupation, and shopping/walking for exercise  PERSONAL FACTORS: 1 comorbidity: medical history are also affecting patient's functional outcome.   REHAB POTENTIAL: Good  CLINICAL DECISION MAKING: Stable/uncomplicated  EVALUATION COMPLEXITY: Low   GOALS: Goals reviewed with patient? Yes  SHORT TERM GOALS: Target date: 11/22/2023   Pt will be independent with HEP in order to  improve activity tolerance.   Baseline: Goal status: INITIAL  2.  Patient will report 25% improve in vaignal pain in order to increase activity tolerance.   Baseline: 10/10 Goal status: INITIAL  3.  Patient will report 25% improvement in urinary incontinence in order to decrease pad use, risk of infection, and quality of life.   Baseline: leaks on the way to the bathroom and  stress incontinence Goal status: INITIAL  4.  Pt will be able to correctly perform diaphragmatic breathing and appropriate pressure management in order to prevent worsening vaginal wall laxity and improve pelvic floor A/ROM.   Baseline:  Goal status: INITIAL  5.  Pt will be independent with the knack, urge suppression technique, and double voiding in order to improve bladder habits and decrease urinary incontinence.   Baseline:  Goal status: INITIAL    LONG TERM GOALS: Target date: 01/17/2024   Pt will be independent with advanced HEP in order to improve activity tolerance.   Baseline:  Goal status: INITIAL  2.  Patient will report 75% improve in vaginal pain in order to increase activity tolerance.   Baseline: 10/10 Goal status: INITIAL  3.  Patient will report 75% improvement in urinary incontinence in order to decrease pad use, decrease risk of infection, and improve quality of life.   Baseline:  Goal status: INITIAL  4.  Pt will report improved fecal urgency and no episodes of incontinence when stool is loose.  Baseline: will leak when stool becomes looser  Goal status: INITIAL  5.  Pt will be able to go 2-3 hours in between voids without urgency or incontinence in order to improve QOL and perform all functional activities with less difficulty.   Baseline:  Goal status: INITIAL  6.  Pt will be able to walk around grocery store without any increase in vaginal pain or pressure. Baseline: Severe pain and pressure  Goal status: INITIAL  PLAN:  PT FREQUENCY: 1-2x/week  PT DURATION: 10  visits    PLANNED INTERVENTIONS: 97164- PT Re-evaluation, 97110-Therapeutic exercises, 97530- Therapeutic activity, 97112- Neuromuscular re-education, 97535- Self Care, 02859- Manual therapy, [phone]- Gait training, [phone]- Aquatic Therapy, [phone]- Electrical stimulation (unattended), [phone]- Traction (mechanical), [id number]- Ionotophoresis 4mg /ml Dexamethasone , 79439 (1-2 muscles), 20561 (3+ muscles)- Dry Needling, Patient/Family education, Balance training, Taping, Joint mobilization, Joint manipulation, Spinal manipulation, Spinal mobilization, Scar mobilization, Vestibular training, Cryotherapy, Moist heat, and Biofeedback  PLAN FOR NEXT SESSION: did not give inverted lying position due to thoracic kyphosis; manual techniques to low abdomen restriction; core strengthening; mobility activities; increasing voiding schedule  Josette Mares, PT, DPT09/04/251:45 PM

## 2023-10-31 ENCOUNTER — Ambulatory Visit (HOSPITAL_BASED_OUTPATIENT_CLINIC_OR_DEPARTMENT_OTHER)
Admission: RE | Admit: 2023-10-31 | Discharge: 2023-10-31 | Disposition: A | Discharge status: Home / Self Care | Source: Ambulatory Visit | Attending: Family Medicine | Admitting: Family Medicine

## 2023-10-31 DIAGNOSIS — Z13820 Encounter for screening for osteoporosis: Secondary | ICD-10-CM | POA: Diagnosis present

## 2023-10-31 DIAGNOSIS — Z780 Asymptomatic menopausal state: Secondary | ICD-10-CM | POA: Diagnosis present

## 2023-11-15 ENCOUNTER — Ambulatory Visit: Admitting: Obstetrics and Gynecology

## 2023-11-15 ENCOUNTER — Encounter: Payer: Self-pay | Admitting: Obstetrics and Gynecology

## 2023-11-15 ENCOUNTER — Other Ambulatory Visit: Payer: Self-pay | Admitting: Obstetrics and Gynecology

## 2023-11-15 VITALS — BP 130/81 | HR 73

## 2023-11-15 DIAGNOSIS — R152 Fecal urgency: Secondary | ICD-10-CM

## 2023-11-15 DIAGNOSIS — L900 Lichen sclerosus et atrophicus: Secondary | ICD-10-CM

## 2023-11-15 DIAGNOSIS — Z87440 Personal history of urinary (tract) infections: Secondary | ICD-10-CM

## 2023-11-15 DIAGNOSIS — N390 Urinary tract infection, site not specified: Secondary | ICD-10-CM

## 2023-11-15 DIAGNOSIS — R350 Frequency of micturition: Secondary | ICD-10-CM

## 2023-11-15 MED ORDER — CLOBETASOL PROPIONATE E 0.05 % EX CREA: 1.0000 | TOPICAL_CREAM | Freq: Every evening | CUTANEOUS | 0 refills | Status: AC

## 2023-11-15 MED ORDER — METHENAMINE HIPPURATE 1 G PO TABS: 1.0000 g | ORAL_TABLET | Freq: Every day | ORAL | 2 refills | Status: DC

## 2023-11-15 NOTE — Telephone Encounter (Signed)
 I've called the pharmacy to inform them

## 2023-11-15 NOTE — Progress Notes (Signed)
 [facility] Return Visit  SUBJECTIVE  History of Present Illness: Kimberly Griffith is a 68 y.o. female seen in follow-up for vulvar itching.   Patient has been taking her metamucil cookies which is helping with her stool bulking, but she is still having some near misses and lots of fecal urgency.   Patient had her initial PT evaluation and that's when the possible lichen was observed by PT. She is set to return for more consistent PT in November.   Patient has not had any recent UTIs.    Past Medical History: Patient  has a past medical history of Allergy, Arthritis (2022), Blood transfusion without reported diagnosis (02/21/1991), Cancer (HCC) (02/21/2015), Chronic kidney disease, Clotting disorder, Headache, History of kidney stones (02/20/2006), Hypertension (2017), Neuromuscular disorder (HCC) (2020), Pneumonia (02/20/2013), and Tremor.   Past Surgical History: She  has a past surgical history that includes Tonsillectomy and adenoidectomy; Wisdom tooth extraction; Robotic assisted total hysterectomy with bilateral salpingo oophorectomy (Bilateral, 07/13/2015); Lymph node biopsy (Bilateral, 07/13/2015); Abdominal hysterectomy (2017); and Colonoscopy (02/2016).   Medications: She has a current medication list which includes the following prescription(s): d3, clobetasol  propionate e, cranberry, cyclobenzaprine , estradiol , hydrochlorothiazide , lactase, methenamine , multivitamin, mupirocin 2% oint-hydrocortisone 2.5% cream-nystatin  cream-zinc  oxide 13% oint 1:1:1:5 mixture, nystatin , phenazopyridine , and triamcinolone  cream, and the following Facility-Administered Medications: psyllium.   Allergies: Patient is allergic to kiwi extract, bactrim  [sulfamethoxazole -trimethoprim ], amoxicillin , and microplegia msa-msg [plegisol].   Social History: Patient  reports that she quit smoking about 39 years ago. Her smoking use included cigarettes. She does not have any smokeless tobacco  history on file. She reports current alcohol use of about 1.0 standard drink of alcohol per week. She reports that she does not use drugs.     OBJECTIVE   Patient deferred chaperone for exam  Physical Exam: Vitals:   11/15/23 0756  BP: 130/81  Pulse: 73   Gen: No apparent distress, A&O x 3.  Detailed Urogynecologic Evaluation:  External vaginal exam done. Patient has pale scaly looking areas around the posterior fourchette and down to vulva concerning for lichen.    ASSESSMENT AND PLAN    Kimberly Griffith is a 68 y.o. with:  1. Lichen sclerosus   2. Urinary frequency   3. Fecal urgency   4. Recurrent UTI    Will start patient on low dose clobetasol  cream. She is to use this weekly for the first month a few times a week to get the scratch/itch cycle under control and then use it as needed after the first month.  Has not been as bad with UTIs under control. She feels the exercises PT gave her in the interim while she is awaiting more frequent PT is helpful.  Still having urgency, but has not had any recent accidents.  Will discontinue prophylaxis and start patient on Hiprex  1g daily with food. We discussed that this is an anti-infective not an antibiotic. She is open to trying it but weary of the recurrent UTI's coming back.   Pateint to follow up in 2 months for skin re-eval and to see how she is doing with the UTI preventative measures.    Kimberly Finder G Terryn Redner, NP

## 2023-11-15 NOTE — Patient Instructions (Signed)
 You have signs of early lichens sclerosis, this is treated with low dose steroid cream. You can start using this 2-3 times a week for the first month and then try to use it as needed when the itching it bothersome. We don't want you to use it every night as it can cause thinning of the skin. Do your best not to scratch.   Continue estrogen cream inside the vagina twice a week.   Continue the metamucil cookies to bulk the stool  Stop the antibiotics and start Hiprex  daily with food.

## 2023-11-29 ENCOUNTER — Other Ambulatory Visit: Payer: Self-pay | Admitting: Obstetrics and Gynecology

## 2023-12-25 ENCOUNTER — Ambulatory Visit: Attending: Obstetrics and Gynecology

## 2023-12-25 DIAGNOSIS — R279 Unspecified lack of coordination: Secondary | ICD-10-CM | POA: Diagnosis present

## 2023-12-25 DIAGNOSIS — R102 Pelvic and perineal pain unspecified side: Secondary | ICD-10-CM | POA: Diagnosis present

## 2023-12-25 DIAGNOSIS — M6281 Muscle weakness (generalized): Secondary | ICD-10-CM | POA: Insufficient documentation

## 2023-12-25 DIAGNOSIS — M5459 Other low back pain: Secondary | ICD-10-CM | POA: Diagnosis present

## 2023-12-25 NOTE — Patient Instructions (Signed)
 The knack: Use this technique while coughing, laughing, sneezing, or with any activities that causes you to leak urine a little. Right before you perform one of these activities that increase pressure in the abdomen and pushes a little urine out, perform a pelvic floor muscle contraction and hold. If that does not completely stop the leaking, try tightening your thighs together in addition to performing a pelvic floor muscle contraction. Make sure you are not trying to stifle a cough, sneeze, or laugh; allow these activities in full as it will cause less pressure down into the bladder and pelvic floor muscles.      Urge Drill  When you feel an urge to go, follow these steps to regain control: Stop what you are doing and be still Take one deep breath, directing your air into your abdomen Think an affirming thought, such as "I've got this." Do 5 quick flicks of your pelvic floor Walk with control to the bathroom to void, or delay voiding

## 2023-12-25 NOTE — Therapy (Signed)
 OUTPATIENT PHYSICAL THERAPY FEMALE PELVIC EVALUATION   Patient Name: Kimberly Griffith MRN: [id number] DOB:1955/05/23, 68 y.o., female Today's Date: 12/25/2023  END OF SESSION:  PT End of Session - 12/25/23 0846     Visit Number 2    Date for Recertification  01/17/24    Authorization Type Medicare, Kx at 15    Progress Note Due on Visit 10    PT Start Time 0846    PT Stop Time 0927    PT Time Calculation (min) 41 min    Activity Tolerance Patient tolerated treatment well    Behavior During Therapy [facility] for tasks assessed/performed           Past Medical History:  Diagnosis Date   Allergy    kiwi   Arthritis 2022   hands   Blood transfusion without reported diagnosis 02/21/1991   Dx'd with E.Coli   Cancer (HCC) 02/21/2015   endometrial   Chronic kidney disease    Clotting disorder    Headache    History of kidney stones 02/20/2006   Hypertension 2017   Suspected   Neuromuscular disorder (HCC) 2020   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, hand   Pneumonia 02/20/2013   Tremor    left thumb   Past Surgical History:  Procedure Laterality Date   ABDOMINAL HYSTERECTOMY  2017   COLONOSCOPY  02/2016   3 yr recall, Elspeth Naval   LYMPH NODE BIOPSY Bilateral 07/13/2015   Procedure: SENTINEL LYMPH NODE BIOPSY;  Surgeon: Sari Bachelor, MD;  Location: [facility];  Service: Gynecology;  Laterality: Bilateral;   ROBOTIC ASSISTED TOTAL HYSTERECTOMY WITH BILATERAL SALPINGO OOPHERECTOMY Bilateral 07/13/2015   Procedure: XI ROBOTIC ASSISTED LAPAROSCOPIC TOTAL HYSTERECTOMY WITH BILATERAL SALPINGO OOPHORECTOMY;  Surgeon: Sari Bachelor, MD;  Location: [facility];  Service: Gynecology;  Laterality: Bilateral;   TONSILLECTOMY AND ADENOIDECTOMY     WISDOM TOOTH EXTRACTION     Patient Active Problem List   Diagnosis Date Noted   Cellulitis of left lower extremity 09/21/2023   Left upper arm pain 09/21/2023   Osteoporosis, 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   Pain in right upper arm 09/21/2023   Diarrhea  05/13/2023   Vitreous floaters of right eye 04/05/2023   Recurrent UTI 02/08/2023   Acute cystitis without hematuria 02/08/2023   Candidiasis 12/20/2022   Encounter for osteoporosis screening in asymptomatic postmenopausal patient 12/20/2022   Establishing care with new doctor, encounter for 12/20/2022   Primary hypertension 12/06/2022   Tremor of left hand 12/06/2022   Acute deep vein thrombosis (DVT) of popliteal vein of left lower extremity (HCC) 11/20/2022   Foot sprain, left, subsequent encounter 11/28/2019   Endometrial cancer (HCC) 07/05/2015    PCP: Booker Darice SAUNDERS, FNP  REFERRING PROVIDER: Zuleta, Kaitlin G, NP   REFERRING DIAG: [phone] (ICD-10-CM) - Levator spasm N32.81 (ICD-10-CM) - OAB (overactive bladder) R35.0 (ICD-10-CM) - Urinary frequency R15.2 (ICD-10-CM) - Fecal urgency  THERAPY DIAG:  Muscle weakness (generalized)  Unspecified lack of coordination  Pelvic pain  Other low back pain  Rationale for Evaluation and Treatment: Rehabilitation  ONSET DATE: 2022  SUBJECTIVE:  SUBJECTIVE STATEMENT: Pt states that she feels like there has been some improvement.    PAIN: 12/25/23 Are you having pain? Yes NPRS scale: 10/10 - been better since starting estrogen Pain location: Vaginal  Pain type: aching, burning, throbbing, and bulging Pain description: intermittent   Aggravating factors: walking, especially on hard surfaces Relieving factors: lying down  PRECAUTIONS: Other: hx endometrial cancer  RED FLAGS: None   WEIGHT BEARING RESTRICTIONS: No  FALLS:  Has patient fallen in last 6 months? No  OCCUPATION: admin  ACTIVITY LEVEL : walking on treadmill   PLOF: Independent  PATIENT GOALS: decrease vaginal pain   PERTINENT HISTORY:  Abdominal hysterectomy 2017, hx  endometrial cancer Sexual abuse: No  BOWEL MOVEMENT: Pain with bowel movement: No Type of bowel movement:Type (Bristol Stool Scale) 4-7, Frequency 1-4x/day, and Strain no Fully empty rectum: Yes:   Leakage: Yes: but not since starting metamucil; she still has strong urgency Pads: Yes: 1 - more just in case Fiber supplement/laxative Yes - taking fiber  URINATION: Pain with urination: No Fully empty bladder: Yes: usually Stream: sometimes hard to start Urgency: yes, but getting better Frequency: 3-4x/day; she has timed fluids differently and now large improvement in nocturia, even slept through the night several times Fluid Intake: trying to get 60 oz a day Leakage: Urge to void, Walking to the bathroom, Coughing, Sneezing, Laughing, and Exercise Pads: Yes: just in case with small dribbles *history of frequent UTIs   INTERCOURSE:  Ability to have vaginal penetration Yes  Pain with intercourse: none DrynessYes  Climax: WNL Marinoff Scale: 0/3 Lubricant: sometimes   PREGNANCY: Vaginal deliveries 6 Tearing Yes: tear with second Episiotomy No C-section deliveries 0 Currently pregnant No  PROLAPSE: Pressure and Bulge   OBJECTIVE:  Note: Objective measures were completed at Evaluation unless otherwise noted.  10/25/23 PATIENT SURVEYS:   PFIQ-7: 67 (bladder)  COGNITION: Overall cognitive status: Within functional limits for tasks assessed     SENSATION: Light touch: Appears intact   FUNCTIONAL TESTS:  Squat: Lt weight shift Single leg stance:  Rt: pelvic drop with UE hand hold  Lt: pelvic drop with UE hand hold  Curl-up test: significant difficulty and abdominal doming   GAIT: Assistive device utilized: None Comments: forward flexed posture, decreased hip extension   POSTURE: rounded shoulders, forward head, decreased lumbar lordosis, increased thoracic kyphosis, posterior pelvic tilt, and Lt lower thoracic curvature   LUMBARAROM/PROM:  A/PROM A/PROM   Eval (% available)  Flexion 75  Extension 25  Right lateral flexion 50  Left lateral flexion 50  Right rotation 50  Left rotation 50   (Blank rows = not tested)  PALPATION:   General: tightness throughout thoracic and lumbar paraspinals  Pelvic Alignment: posterior pelvic tilt  Abdominal: tenderness throughout bil lower abdomen; increased sternocostal angle                External Perineal Exam: white areas on labia - contacted medical provided about checking for lichens sclerosis                             Internal Pelvic Floor: muscle atrophy throughout bil levator ani  Patient confirms identification and approves PT to assess internal pelvic floor and treatment Yes  PELVIC MMT:   MMT eval  Vaginal 2/5, 6 second endurance, 5 repeat contractions  Diastasis Recti 4 finger widths  (Blank rows = not tested)        TONE: Low  tone  PROLAPSE: Grade 1 vaginal wall laxity  TODAY'S TREATMENT:                                                                                                                              DATE:  12/24/23 Neuromuscular re-education: In depth review and clarification of initial HEP including long holds, quick flicks, urge drill, and the knack; written notes provided on handout to help clarify Supine hip adduction + pelvic floor muscles 2 x 10 Supine bridge + hip adduction + pelvic floor muscles 2 x 5 Supine march + red band 2 x 10 Exercises: Supine lower trunk rotation 2 x 10 Supine posterior pelvic tilts 10x Seated piriformis stretch 60 sec bil   10/25/23 EVAL  Neuromuscular re-education: Pt provides verbal consent for internal vaginal/rectal pelvic floor exam. Internal vaginal pelvic floor muscle contraction training Quick flicks Long holds Urge drill The knack   PATIENT EDUCATION:  Education details: See above Person educated: Patient Education method: Explanation, Demonstration, Tactile cues, Verbal cues, and Handouts Education  comprehension: verbalized understanding  HOME EXERCISE PROGRAM: [id number]  ASSESSMENT:  CLINICAL IMPRESSION: Patient is a 68 y.o. female who was seen today for physical therapy evaluation and treatment for vaginal pain and urinary and fecal urgency/incontinence. Pt feels like she has seen some improvements in bladder control. She was diagnosed with lichens sclerosis and started clobetasol  cream, but had increased leaking so she has stopped using. We contacted NP today to ask for further instruction/options. Due to confusion with HEP, we went over in depth. We progressed exercises in session, but did not update HEP today. Good tolerance to most exercises; she did have some increase in low back pain, but with slight modifications she reported improved tolerance. She will continue to benefit from skilled PT intervention in order to decrease vaginal pain, improve fecal/urinary urgency/incontinence, address impairments, and improve quality of life.   OBJECTIVE IMPAIRMENTS: decreased activity tolerance, decreased coordination, decreased endurance, decreased mobility, decreased ROM, decreased strength, increased fascial restrictions, increased muscle spasms, impaired flexibility, impaired tone, improper body mechanics, postural dysfunction, and pain.   ACTIVITY LIMITATIONS: standing, continence, and locomotion level  PARTICIPATION LIMITATIONS: cleaning, laundry, community activity, occupation, and shopping/walking for exercise  PERSONAL FACTORS: 1 comorbidity: medical history are also affecting patient's functional outcome.   REHAB POTENTIAL: Good  CLINICAL DECISION MAKING: Stable/uncomplicated  EVALUATION COMPLEXITY: Low   GOALS: Goals reviewed with patient? Yes  SHORT TERM GOALS: Target date: 11/22/2023   Pt will be independent with HEP in order to improve activity tolerance.   Baseline: Goal status: MET 12/25/2023  2.  Patient will report 25% improve in vaignal pain in order to increase  activity tolerance.   Baseline: 10/10 Goal status: IN PROGRESS 12/25/2023  3.  Patient will report 25% improvement in urinary incontinence in order to decrease pad use, risk of infection, and quality of life.   Baseline: leaks on the way to the bathroom and stress incontinence Goal status:  IN PROGRESS 12/25/2023  4.  Pt will be able to correctly perform diaphragmatic breathing and appropriate pressure management in order to prevent worsening vaginal wall laxity and improve pelvic floor A/ROM.   Baseline:  Goal status: IN PROGRESS 12/25/2023  5.  Pt will be independent with the knack, urge suppression technique, and double voiding in order to improve bladder habits and decrease urinary incontinence.   Baseline:  Goal status: IN PROGRESS 12/25/2023    LONG TERM GOALS: Target date: 01/17/2024   Pt will be independent with advanced HEP in order to improve activity tolerance.   Baseline:  Goal status: IN PROGRESS 12/25/2023  2.  Patient will report 75% improve in vaginal pain in order to increase activity tolerance.   Baseline: 10/10 Goal status: IN PROGRESS 12/25/2023  3.  Patient will report 75% improvement in urinary incontinence in order to decrease pad use, decrease risk of infection, and improve quality of life.   Baseline:  Goal status: IN PROGRESS 12/25/2023  4.  Pt will report improved fecal urgency and no episodes of incontinence when stool is loose.  Baseline: will leak when stool becomes looser  Goal status: IN PROGRESS 12/25/2023  5.  Pt will be able to go 2-3 hours in between voids without urgency or incontinence in order to improve QOL and perform all functional activities with less difficulty.   Baseline:  Goal status: IN PROGRESS 12/25/2023  6.  Pt will be able to walk around grocery store without any increase in vaginal pain or pressure. Baseline: Severe pain and pressure  Goal status: IN PROGRESS 12/25/2023  PLAN:  PT FREQUENCY: 1-2x/week  PT DURATION: 10  visits    PLANNED INTERVENTIONS: [id number]- PT Re-evaluation, 97110-Therapeutic exercises, 97530- Therapeutic activity, 97112- Neuromuscular re-education, 97535- Self Care, [id number]- Manual therapy, [phone]- Gait training, [phone]- Aquatic Therapy, [phone]- Electrical stimulation (unattended), [phone]- Traction (mechanical), [id number]- Ionotophoresis 4mg /ml Dexamethasone , 79439 (1-2 muscles), 20561 (3+ muscles)- Dry Needling, Patient/Family education, Balance training, Taping, Joint mobilization, Joint manipulation, Spinal manipulation, Spinal mobilization, Scar mobilization, Vestibular training, Cryotherapy, Moist heat, and Biofeedback  PLAN FOR NEXT SESSION: did not give inverted lying position due to thoracic kyphosis; manual techniques to low abdomen restriction; core strengthening; mobility activities; increasing voiding schedule  Josette Mares, PT, DPT11/04/259:28 AM

## 2024-01-03 ENCOUNTER — Ambulatory Visit

## 2024-01-03 DIAGNOSIS — R102 Pelvic and perineal pain unspecified side: Secondary | ICD-10-CM

## 2024-01-03 DIAGNOSIS — M6281 Muscle weakness (generalized): Secondary | ICD-10-CM

## 2024-01-03 DIAGNOSIS — M5459 Other low back pain: Secondary | ICD-10-CM

## 2024-01-03 DIAGNOSIS — R279 Unspecified lack of coordination: Secondary | ICD-10-CM

## 2024-01-03 NOTE — Therapy (Signed)
 OUTPATIENT PHYSICAL THERAPY FEMALE PELVIC EVALUATION   Patient Name: Kimberly Griffith MRN: [id number] DOB:1955/09/28, 68 y.o., female Today's Date: 01/03/2024  END OF SESSION:  PT End of Session - 01/03/24 0804     Visit Number 3    Date for Recertification  01/17/24    Authorization Type Medicare, Kx at 15    Progress Note Due on Visit 10    PT Start Time 0803    PT Stop Time 0841    PT Time Calculation (min) 38 min    Activity Tolerance Patient tolerated treatment well    Behavior During Therapy [facility] for tasks assessed/performed           Past Medical History:  Diagnosis Date   Allergy    kiwi   Arthritis 2022   hands   Blood transfusion without reported diagnosis 02/21/1991   Dx'd with E.Coli   Cancer (HCC) 02/21/2015   endometrial   Chronic kidney disease    Clotting disorder    Headache    History of kidney stones 02/20/2006   Hypertension 2017   Suspected   Neuromuscular disorder (HCC) 2020   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, hand   Pneumonia 02/20/2013   Tremor    left thumb   Past Surgical History:  Procedure Laterality Date   ABDOMINAL HYSTERECTOMY  2017   COLONOSCOPY  02/2016   3 yr recall, Elspeth Naval   LYMPH NODE BIOPSY Bilateral 07/13/2015   Procedure: SENTINEL LYMPH NODE BIOPSY;  Surgeon: Sari Bachelor, MD;  Location: [facility];  Service: Gynecology;  Laterality: Bilateral;   ROBOTIC ASSISTED TOTAL HYSTERECTOMY WITH BILATERAL SALPINGO OOPHERECTOMY Bilateral 07/13/2015   Procedure: XI ROBOTIC ASSISTED LAPAROSCOPIC TOTAL HYSTERECTOMY WITH BILATERAL SALPINGO OOPHORECTOMY;  Surgeon: Sari Bachelor, MD;  Location: [facility];  Service: Gynecology;  Laterality: Bilateral;   TONSILLECTOMY AND ADENOIDECTOMY     WISDOM TOOTH EXTRACTION     Patient Active Problem List   Diagnosis Date Noted   Cellulitis of left lower extremity 09/21/2023   Left upper arm pain 09/21/2023   Osteoporosis, 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   Pain in right upper arm 09/21/2023   Diarrhea  05/13/2023   Vitreous floaters of right eye 04/05/2023   Recurrent UTI 02/08/2023   Acute cystitis without hematuria 02/08/2023   Candidiasis 12/20/2022   Encounter for osteoporosis screening in asymptomatic postmenopausal patient 12/20/2022   Establishing care with new doctor, encounter for 12/20/2022   Primary hypertension 12/06/2022   Tremor of left hand 12/06/2022   Acute deep vein thrombosis (DVT) of popliteal vein of left lower extremity (HCC) 11/20/2022   Foot sprain, left, subsequent encounter 11/28/2019   Endometrial cancer (HCC) 07/05/2015    PCP: Booker Darice SAUNDERS, FNP  REFERRING PROVIDER: Zuleta, Kaitlin G, NP   REFERRING DIAG: [id number] (ICD-10-CM) - Levator spasm N32.81 (ICD-10-CM) - OAB (overactive bladder) R35.0 (ICD-10-CM) - Urinary frequency R15.2 (ICD-10-CM) - Fecal urgency  THERAPY DIAG:  Muscle weakness (generalized)  Unspecified lack of coordination  Pelvic pain  Other low back pain  Rationale for Evaluation and Treatment: Rehabilitation  ONSET DATE: 2022  SUBJECTIVE:  SUBJECTIVE STATEMENT: Pt states that she has had a couple of weak moments with bowel and bladder. She has been working on exercises 3x/day. She states that about 15 minutes after exercises yesterday she had severe Lt lower quadrant pain. She had low back pain on the Lt side at the same time.    PAIN: 12/25/23 Are you having pain? Yes NPRS scale: 10/10 - been better since starting estrogen Pain location: Vaginal  Pain type: aching, burning, throbbing, and bulging Pain description: intermittent   Aggravating factors: walking, especially on hard surfaces Relieving factors: lying down  PRECAUTIONS: Other: hx endometrial cancer  RED FLAGS: None   WEIGHT BEARING RESTRICTIONS: No  FALLS:  Has patient  fallen in last 6 months? No  OCCUPATION: admin  ACTIVITY LEVEL : walking on treadmill   PLOF: Independent  PATIENT GOALS: decrease vaginal pain   PERTINENT HISTORY:  Abdominal hysterectomy 2017, hx endometrial cancer Sexual abuse: No  BOWEL MOVEMENT: Pain with bowel movement: No Type of bowel movement:Type (Bristol Stool Scale) 4-7, Frequency 1-4x/day, and Strain no Fully empty rectum: Yes:   Leakage: Yes: but not since starting metamucil; she still has strong urgency Pads: Yes: 1 - more just in case Fiber supplement/laxative Yes - taking fiber  URINATION: Pain with urination: No Fully empty bladder: Yes: usually Stream: sometimes hard to start Urgency: yes, but getting better Frequency: 3-4x/day; she has timed fluids differently and now large improvement in nocturia, even slept through the night several times Fluid Intake: trying to get 60 oz a day Leakage: Urge to void, Walking to the bathroom, Coughing, Sneezing, Laughing, and Exercise Pads: Yes: just in case with small dribbles *history of frequent UTIs   INTERCOURSE:  Ability to have vaginal penetration Yes  Pain with intercourse: none DrynessYes  Climax: WNL Marinoff Scale: 0/3 Lubricant: sometimes   PREGNANCY: Vaginal deliveries 6 Tearing Yes: tear with second Episiotomy No C-section deliveries 0 Currently pregnant No  PROLAPSE: Pressure and Bulge   OBJECTIVE:  Note: Objective measures were completed at Evaluation unless otherwise noted.  10/25/23 PATIENT SURVEYS:   PFIQ-7: 67 (bladder)  COGNITION: Overall cognitive status: Within functional limits for tasks assessed     SENSATION: Light touch: Appears intact   FUNCTIONAL TESTS:  Squat: Lt weight shift Single leg stance:  Rt: pelvic drop with UE hand hold  Lt: pelvic drop with UE hand hold  Curl-up test: significant difficulty and abdominal doming   GAIT: Assistive device utilized: None Comments: forward flexed posture, decreased hip  extension   POSTURE: rounded shoulders, forward head, decreased lumbar lordosis, increased thoracic kyphosis, posterior pelvic tilt, and Lt lower thoracic curvature   LUMBARAROM/PROM:  A/PROM A/PROM  Eval (% available)  Flexion 75  Extension 25  Right lateral flexion 50  Left lateral flexion 50  Right rotation 50  Left rotation 50   (Blank rows = not tested)  PALPATION:   General: tightness throughout thoracic and lumbar paraspinals  Pelvic Alignment: posterior pelvic tilt  Abdominal: tenderness throughout bil lower abdomen; increased sternocostal angle                External Perineal Exam: white areas on labia - contacted medical provided about checking for lichens sclerosis                             Internal Pelvic Floor: muscle atrophy throughout bil levator ani  Patient confirms identification and approves PT to assess internal pelvic floor  and treatment Yes  PELVIC MMT:   MMT eval  Vaginal 2/5, 6 second endurance, 5 repeat contractions  Diastasis Recti 4 finger widths  (Blank rows = not tested)        TONE: Low tone  PROLAPSE: Grade 1 vaginal wall laxity  TODAY'S TREATMENT:                                                                                                                              DATE:  01/03/24 Manual: Supine: Abdominal scar tissue mobilization Lower abdomen myofascial release  Soft tissue mobilization to lower abdominals  Exercises: Lower trunk rotation 2 x 10 Open books 10x bil Supine bent knee fall out 10x bil  Modified thomas stretch 2 min bil   12/24/23 Neuromuscular re-education: In depth review and clarification of initial HEP including long holds, quick flicks, urge drill, and the knack; written notes provided on handout to help clarify Supine hip adduction + pelvic floor muscles 2 x 10 Supine bridge + hip adduction + pelvic floor muscles 2 x 5 Supine march + red band 2 x 10 Exercises: Supine lower trunk rotation 2 x  10 Supine posterior pelvic tilts 10x Seated piriformis stretch 60 sec bil   10/25/23 EVAL  Neuromuscular re-education: Pt provides verbal consent for internal vaginal/rectal pelvic floor exam. Internal vaginal pelvic floor muscle contraction training Quick flicks Long holds Urge drill The knack   PATIENT EDUCATION:  Education details: See above Person educated: Patient Education method: Explanation, Demonstration, Tactile cues, Verbal cues, and Handouts Education comprehension: verbalized understanding  HOME EXERCISE PROGRAM: [id number]  ASSESSMENT:  CLINICAL IMPRESSION: Patient is a 68 y.o. female who was seen today for physical therapy evaluation and treatment for vaginal pain and urinary and fecal urgency/incontinence. Pt had some severe lower abdominal pain yesterday; believe this could be due to muscles getting stronger and pulling on scar tissue more. We performed manual techniques to tight muscles and scar tissue in abdomen with notable restriction and discomfort, but overall good tolerance and improved restriction. Active treatment focused on mobility today with good tolerance. She will continue to benefit from skilled PT intervention in order to decrease vaginal pain, improve fecal/urinary urgency/incontinence, address impairments, and improve quality of life.   OBJECTIVE IMPAIRMENTS: decreased activity tolerance, decreased coordination, decreased endurance, decreased mobility, decreased ROM, decreased strength, increased fascial restrictions, increased muscle spasms, impaired flexibility, impaired tone, improper body mechanics, postural dysfunction, and pain.   ACTIVITY LIMITATIONS: standing, continence, and locomotion level  PARTICIPATION LIMITATIONS: cleaning, laundry, community activity, occupation, and shopping/walking for exercise  PERSONAL FACTORS: 1 comorbidity: medical history are also affecting patient's functional outcome.   REHAB POTENTIAL: Good  CLINICAL  DECISION MAKING: Stable/uncomplicated  EVALUATION COMPLEXITY: Low   GOALS: Goals reviewed with patient? Yes  SHORT TERM GOALS: Target date: 11/22/2023   Pt will be independent with HEP in order to improve activity tolerance.   Baseline: Goal status: MET 12/25/2023  2.  Patient will  report 25% improve in vaignal pain in order to increase activity tolerance.   Baseline: 10/10 Goal status: IN PROGRESS 12/25/2023  3.  Patient will report 25% improvement in urinary incontinence in order to decrease pad use, risk of infection, and quality of life.   Baseline: leaks on the way to the bathroom and stress incontinence Goal status: IN PROGRESS 12/25/2023  4.  Pt will be able to correctly perform diaphragmatic breathing and appropriate pressure management in order to prevent worsening vaginal wall laxity and improve pelvic floor A/ROM.   Baseline:  Goal status: IN PROGRESS 12/25/2023  5.  Pt will be independent with the knack, urge suppression technique, and double voiding in order to improve bladder habits and decrease urinary incontinence.   Baseline:  Goal status: IN PROGRESS 12/25/2023    LONG TERM GOALS: Target date: 01/17/2024   Pt will be independent with advanced HEP in order to improve activity tolerance.   Baseline:  Goal status: IN PROGRESS 12/25/2023  2.  Patient will report 75% improve in vaginal pain in order to increase activity tolerance.   Baseline: 10/10 Goal status: IN PROGRESS 12/25/2023  3.  Patient will report 75% improvement in urinary incontinence in order to decrease pad use, decrease risk of infection, and improve quality of life.   Baseline:  Goal status: IN PROGRESS 12/25/2023  4.  Pt will report improved fecal urgency and no episodes of incontinence when stool is loose.  Baseline: will leak when stool becomes looser  Goal status: IN PROGRESS 12/25/2023  5.  Pt will be able to go 2-3 hours in between voids without urgency or incontinence in order to  improve QOL and perform all functional activities with less difficulty.   Baseline:  Goal status: IN PROGRESS 12/25/2023  6.  Pt will be able to walk around grocery store without any increase in vaginal pain or pressure. Baseline: Severe pain and pressure  Goal status: IN PROGRESS 12/25/2023  PLAN:  PT FREQUENCY: 1-2x/week  PT DURATION: 10 visits    PLANNED INTERVENTIONS: 97164- PT Re-evaluation, 97110-Therapeutic exercises, 97530- Therapeutic activity, 97112- Neuromuscular re-education, 97535- Self Care, 02859- Manual therapy, [phone]- Gait training, [phone]- Aquatic Therapy, [phone]- Electrical stimulation (unattended), [phone]- Traction (mechanical), [phone]- Ionotophoresis 4mg /ml Dexamethasone , 79439 (1-2 muscles), 20561 (3+ muscles)- Dry Needling, Patient/Family education, Balance training, Taping, Joint mobilization, Joint manipulation, Spinal manipulation, Spinal mobilization, Scar mobilization, Vestibular training, Cryotherapy, Moist heat, and Biofeedback  PLAN FOR NEXT SESSION: did not give inverted lying position due to thoracic kyphosis; manual techniques to low abdomen restriction; core strengthening; mobility activities; increasing voiding schedule  Josette Mares, PT, DPT11/13/258:04 AM

## 2024-01-10 ENCOUNTER — Encounter

## 2024-01-11 ENCOUNTER — Ambulatory Visit
Admission: RE | Admit: 2024-01-11 | Discharge: 2024-01-11 | Disposition: A | Discharge status: Home / Self Care | Payer: Self-pay | Attending: Family Medicine | Admitting: Family Medicine

## 2024-01-11 VITALS — BP 148/72 | HR 92 | Temp 98.3°F | Resp 18 | Ht 64.0 in | Wt 211.0 lb

## 2024-01-11 DIAGNOSIS — J069 Acute upper respiratory infection, unspecified: Secondary | ICD-10-CM | POA: Diagnosis not present

## 2024-01-11 LAB — POC SOFIA SARS ANTIGEN FIA: SARS Coronavirus 2 Ag: NEGATIVE

## 2024-01-11 LAB — POCT RAPID STREP A (OFFICE): Rapid Strep A Screen: NEGATIVE

## 2024-01-11 LAB — POCT INFLUENZA A/B
Influenza A, POC: NEGATIVE
Influenza B, POC: NEGATIVE

## 2024-01-11 MED ORDER — DOXYCYCLINE HYCLATE 100 MG PO CAPS: ORAL_CAPSULE | ORAL | 0 refills | Status: AC

## 2024-01-11 NOTE — Discharge Instructions (Signed)
 Take plain guaifenesin  (1200mg  extended release tabs such as Mucinex ) twice daily, with plenty of water , for cough and congestion.  Get adequate rest.   May use Afrin nasal spray (or generic oxymetazoline) each morning for about 5 days and then discontinue.  Also recommend using saline nasal spray several times daily and saline nasal irrigation (AYR is a common brand).  Use Flonase nasal spray each morning after using Afrin nasal spray and saline nasal irrigation. Try warm salt water  gargles for sore throat.  Stop all antihistamines (Coricidin, etc) for now, and other non-prescription cough/cold preparations. May take Tylenol  as needed. May take Delsym Cough Suppressant (12 Hour Cough Relief) at bedtime for nighttime cough.  Begin Doxycycline  if not improving about one week or if persistent fever develops.

## 2024-01-11 NOTE — ED Triage Notes (Signed)
 Patient c/o sore throat, HA, congestion, low grade fever x 2 days.  Patient has taken Chlorcidin HBP.

## 2024-01-11 NOTE — ED Provider Notes (Signed)
 [facility]    CSN: [id number] Arrival date & time: 01/11/24  0819      History   Chief Complaint Chief Complaint  Patient presents with   Sore Throat    Entered by patient    HPI Kimberly Griffith is a 68 y.o. female.   Patient complains of three day history of typical cold-like symptoms developing over several days, including mild sore throat, sinus congestion, headache, fatigue, and cough.  She notes that her sore throat is now worse.  She denies pleuritic pain and shortness of breath.  Her symptoms have not improved with Coricidin.  She notes that she has had several episodes of pneumonia in the past.  The history is provided by the patient.    Past Medical History:  Diagnosis Date   Allergy    kiwi   Arthritis 2022   hands   Blood transfusion without reported diagnosis 02/21/1991   Dx'd with E.Coli   Cancer (HCC) 02/21/2015   endometrial   Chronic kidney disease    Clotting disorder    Headache    History of kidney stones 02/20/2006   Hypertension 2017   Suspected   Neuromuscular disorder (HCC) 2020   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, hand   Pneumonia 02/20/2013   Tremor    left thumb    Patient Active Problem List   Diagnosis Date Noted   Cellulitis of left lower extremity 09/21/2023   Left upper arm pain 09/21/2023   Osteoporosis, 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   Pain in right upper arm 09/21/2023   Diarrhea 05/13/2023   Vitreous floaters of right eye 04/05/2023   Recurrent UTI 02/08/2023   Acute cystitis without hematuria 02/08/2023   Candidiasis 12/20/2022   Encounter for osteoporosis screening in asymptomatic postmenopausal patient 12/20/2022   Establishing care with new doctor, encounter for 12/20/2022   Primary hypertension 12/06/2022   Tremor of left hand 12/06/2022   Acute deep vein thrombosis (DVT) of popliteal vein of left lower extremity (HCC) 11/20/2022   Foot sprain, left, subsequent encounter 11/28/2019   Endometrial cancer (HCC) 07/05/2015     Past Surgical History:  Procedure Laterality Date   ABDOMINAL HYSTERECTOMY  2017   COLONOSCOPY  02/2016   3 yr recall, Elspeth Naval   LYMPH NODE BIOPSY Bilateral 07/13/2015   Procedure: SENTINEL LYMPH NODE BIOPSY;  Surgeon: Sari Bachelor, MD;  Location: [facility];  Service: Gynecology;  Laterality: Bilateral;   ROBOTIC ASSISTED TOTAL HYSTERECTOMY WITH BILATERAL SALPINGO OOPHERECTOMY Bilateral 07/13/2015   Procedure: XI ROBOTIC ASSISTED LAPAROSCOPIC TOTAL HYSTERECTOMY WITH BILATERAL SALPINGO OOPHORECTOMY;  Surgeon: Sari Bachelor, MD;  Location: [facility];  Service: Gynecology;  Laterality: Bilateral;   TONSILLECTOMY AND ADENOIDECTOMY     WISDOM TOOTH EXTRACTION      OB History     Gravida  7   Para  6   Term  6   Preterm      AB  1   Living  6      SAB  1   IAB      Ectopic      Multiple      Live Births               Home Medications    Prior to Admission medications   Medication Sig Start Date End Date Taking? Authorizing Provider  Cholecalciferol (D3) 25 MCG (1000 UT) capsule Take 1,000 Units by mouth daily.   Yes [provider]  Clobetasol  Prop Emollient Base (CLOBETASOL  PROPIONATE E) 0.05 %  emollient cream Apply 1 Application topically at bedtime. 11/15/23  Yes Zuleta, Kaitlin G, NP  Cranberry 500 MG CAPS Take by mouth.   Yes [provider]  cyclobenzaprine  (FLEXERIL ) 5 MG tablet Take 1 tablet (5 mg total) by mouth at bedtime. 06/11/23  Yes Zuleta, Kaitlin G, NP  doxycycline  (VIBRAMYCIN ) 100 MG capsule Take one cap PO Q12hr with food. 01/11/24  Yes Pauline Garnette LABOR, MD  estradiol  (ESTRACE ) 0.1 MG/GM vaginal cream Place 1 g vaginally 3 (three) times a week. 09/10/23  Yes Zuleta, Kaitlin G, NP  hydrochlorothiazide  (HYDRODIURIL ) 25 MG tablet Take 1 tablet (25 mg total) by mouth daily. 09/21/23  Yes Booker Darice SAUNDERS, FNP  Lactase 9000 units TABS Take by mouth.   Yes [provider]  methenamine  (HIPREX ) 1 g tablet Take 1 tablet (1  g total) by mouth daily at 6 (six) AM. 11/15/23  Yes Zuleta, Kaitlin G, NP  Multiple Vitamin (MULTIVITAMIN) capsule Take 1 capsule by mouth daily. Over 50   Yes [provider]  mupirocin 2% oint-hydrocortisone 2.5% cream-nystatin  cream-zinc  oxide 13% oint 1:1:1:5 mixture Apply topically 2 (two) times daily as needed. 12/20/22  Yes Bevin, Erika S, DO  nystatin  (MYCOSTATIN /NYSTOP ) powder Apply 1 Application topically 3 (three) times daily. 12/20/22  Yes Bevin, Erika S, DO  triamcinolone  cream (KENALOG ) 0.5 % Apply 1 Application topically 2 (two) times daily. To affected areas. 12/20/22  Yes Bevin, Erika S, DO  phenazopyridine  (PYRIDIUM ) 200 MG tablet Take 1 tablet (200 mg total) by mouth 3 (three) times daily as needed for pain. 02/08/23   Bevin Bernice RAMAN, DO    Family History Family History  Problem Relation Age of Onset   Hypertension Mother    Arthritis Mother    Stroke Mother    Varicose Veins Mother    Other Father        dec unknown cause age 67   Hypertension Father    Prostate cancer Father    Hearing loss Father    Hypertension Maternal Grandmother    Stroke Maternal Grandmother    Stomach cancer Maternal Grandmother    Miscarriages / Stillbirths Maternal Grandmother    Vision loss Maternal Grandmother    Alcohol abuse Maternal Grandfather    Varicose Veins Maternal Grandfather    Heart disease Paternal Grandmother    Miscarriages / Stillbirths Paternal Grandmother    Varicose Veins Paternal Grandmother    Heart disease Paternal Grandfather    Colon cancer Neg Hx    Rectal cancer Neg Hx    Esophageal cancer Neg Hx    Liver cancer Neg Hx    Colon polyps Neg Hx     Social History Social History   Tobacco Use   Smoking status: Former    Current packs/day: 0.00    Types: Cigarettes    Quit date: 07/04/1984    Years since quitting: 39.5  Vaping Use   Vaping status: Never Used  Substance Use Topics   Alcohol use: Yes    Alcohol/week: 1.0 standard drink of  alcohol    Types: 1 Standard drinks or equivalent per week    Comment: Beer once a month in summer   Drug use: Never     Allergies   Kiwi extract, Bactrim  [sulfamethoxazole -trimethoprim ], Amoxicillin , and Microplegia msa-msg [plegisol]   Review of Systems Review of Systems + sore throat + cough No pleuritic pain No wheezing + nasal congestion + post-nasal drainage No sinus pain/pressure No itchy/red eyes No earache No hemoptysis No  SOB + fever No nausea No vomiting No abdominal pain No diarrhea No urinary symptoms No skin rash + fatigue + myalgias + headache Used OTC meds (Coricidin) without relief   Physical Exam Triage Vital Signs ED Triage Vitals  Encounter Vitals Group     BP 01/11/24 0836 (!) 148/72     Girls Systolic BP Percentile --      Girls Diastolic BP Percentile --      Boys Systolic BP Percentile --      Boys Diastolic BP Percentile --      Pulse Rate 01/11/24 0836 92     Resp 01/11/24 0836 18     Temp 01/11/24 0836 98.3 F (36.8 C)     Temp Source 01/11/24 0836 Oral     SpO2 01/11/24 0836 94 %     Weight 01/11/24 0834 211 lb (95.7 kg)     Height 01/11/24 0834 5' 4 (1.626 m)     Head Circumference --      Peak Flow --      Pain Score 01/11/24 0834 7     Pain Loc --      Pain Education --      Exclude from Growth Chart --    No data found.  Updated Vital Signs BP (!) 148/72 (BP Location: Right Arm)   Pulse 92   Temp 98.3 F (36.8 C) (Oral)   Resp 18   Ht 5' 4 (1.626 m)   Wt 95.7 kg   LMP 02/21/2011 (Approximate)   SpO2 94%   BMI 36.22 kg/m   Visual Acuity Right Eye Distance:   Left Eye Distance:   Bilateral Distance:    Right Eye Near:   Left Eye Near:    Bilateral Near:     Physical Exam Nursing notes and Vital Signs reviewed. Appearance:  Patient appears stated age, and in no acute distress Eyes:  Pupils are equal, round, and reactive to light and accomodation.  Extraocular movement is intact.  Conjunctivae are not  inflamed  Ears:  Canals normal.  Tympanic membranes normal.  Nose:  Mildly congested turbinates.  No sinus tenderness.  Pharynx:  Erythema posteriorly. Neck:  Supple.  Mildly enlarged lateral nodes are present, tender to palpation on the left.   Lungs:  Clear to auscultation.  Breath sounds are equal.  Moving air well. Heart:  Regular rate and rhythm without murmurs, rubs, or gallops.  Abdomen:  Nontender without masses or hepatosplenomegaly.  Bowel sounds are present.  No CVA or flank tenderness.  Extremities:  No edema.  Skin:  No rash present.   UC Treatments / Results  Labs (all labs ordered are listed, but only abnormal results are displayed) Labs Reviewed  POCT RAPID STREP A (OFFICE) - Negative  POCT INFLUENZA A/B Negative  POC SOFIA SARS ANTIGEN FIA Negative    EKG   Radiology No results found.  Procedures Procedures (including critical care time)  Medications Ordered in UC Medications - No data to display  Initial Impression / Assessment and Plan / UC Course  I have reviewed the triage vital signs and the nursing notes.  Pertinent labs & imaging results that were available during my care of the patient were reviewed by me and considered in my medical decision making (see chart for details).    Benign exam.  There is no evidence of bacterial infection today.  Treat symptomatically for now  Begin doxycycline  if not improved about 7 to 10 days. (Given a prescription to  hold, with an expiration date)  Followup with Family Doctor if not improved in about 10 day  Final Clinical Impressions(s) / UC Diagnoses   Final diagnoses:  Viral URI with cough     Discharge Instructions      Take plain guaifenesin  (1200mg  extended release tabs such as Mucinex ) twice daily, with plenty of water , for cough and congestion.  Get adequate rest.   May use Afrin nasal spray (or generic oxymetazoline) each morning for about 5 days and then discontinue.  Also recommend using saline  nasal spray several times daily and saline nasal irrigation (AYR is a common brand).  Use Flonase nasal spray each morning after using Afrin nasal spray and saline nasal irrigation. Try warm salt water  gargles for sore throat.  Stop all antihistamines (Coricidin, etc) for now, and other non-prescription cough/cold preparations. May take Tylenol  as needed. May take Delsym Cough Suppressant (12 Hour Cough Relief) at bedtime for nighttime cough.  Begin Doxycycline  if not improving about one week or if persistent fever develops.    ED Prescriptions     Medication Sig Dispense Auth. Provider   doxycycline  (VIBRAMYCIN ) 100 MG capsule Take one cap PO Q12hr with food. 14 capsule Pauline Garnette LABOR, MD         Pauline Garnette LABOR, MD 01/12/24 [phone]

## 2024-01-15 ENCOUNTER — Ambulatory Visit

## 2024-01-15 DIAGNOSIS — R279 Unspecified lack of coordination: Secondary | ICD-10-CM

## 2024-01-15 DIAGNOSIS — R102 Pelvic and perineal pain unspecified side: Secondary | ICD-10-CM

## 2024-01-15 DIAGNOSIS — M6281 Muscle weakness (generalized): Secondary | ICD-10-CM | POA: Diagnosis not present

## 2024-01-15 DIAGNOSIS — M5459 Other low back pain: Secondary | ICD-10-CM

## 2024-01-15 NOTE — Therapy (Signed)
 OUTPATIENT PHYSICAL THERAPY FEMALE PELVIC TREATMENT   Patient Name: Kimberly Griffith MRN: [id number] DOB:10-27-1955, 68 y.o., female Today's Date: 01/15/2024  END OF SESSION:  PT End of Session - 01/15/24 0800     Visit Number 4    Date for Recertification  01/17/24    Authorization Type Medicare, Kx at 15    Progress Note Due on Visit 10    PT Start Time 0800    PT Stop Time 0840    PT Time Calculation (min) 40 min    Activity Tolerance Patient tolerated treatment well    Behavior During Therapy [facility] for tasks assessed/performed           Past Medical History:  Diagnosis Date   Allergy    kiwi   Arthritis 2022   hands   Blood transfusion without reported diagnosis 02/21/1991   Dx'd with E.Coli   Cancer (HCC) 02/21/2015   endometrial   Chronic kidney disease    Clotting disorder    Headache    History of kidney stones 02/20/2006   Hypertension 2017   Suspected   Neuromuscular disorder (HCC) 2020   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, hand   Pneumonia 02/20/2013   Tremor    left thumb   Past Surgical History:  Procedure Laterality Date   ABDOMINAL HYSTERECTOMY  2017   COLONOSCOPY  02/2016   3 yr recall, Elspeth Naval   LYMPH NODE BIOPSY Bilateral 07/13/2015   Procedure: SENTINEL LYMPH NODE BIOPSY;  Surgeon: Sari Bachelor, MD;  Location: [facility];  Service: Gynecology;  Laterality: Bilateral;   ROBOTIC ASSISTED TOTAL HYSTERECTOMY WITH BILATERAL SALPINGO OOPHERECTOMY Bilateral 07/13/2015   Procedure: XI ROBOTIC ASSISTED LAPAROSCOPIC TOTAL HYSTERECTOMY WITH BILATERAL SALPINGO OOPHORECTOMY;  Surgeon: Sari Bachelor, MD;  Location: [facility];  Service: Gynecology;  Laterality: Bilateral;   TONSILLECTOMY AND ADENOIDECTOMY     WISDOM TOOTH EXTRACTION     Patient Active Problem List   Diagnosis Date Noted   Cellulitis of left lower extremity 09/21/2023   Left upper arm pain 09/21/2023   Osteoporosis, 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   Pain in right upper arm 09/21/2023   Diarrhea  05/13/2023   Vitreous floaters of right eye 04/05/2023   Recurrent UTI 02/08/2023   Acute cystitis without hematuria 02/08/2023   Candidiasis 12/20/2022   Encounter for osteoporosis screening in asymptomatic postmenopausal patient 12/20/2022   Establishing care with new doctor, encounter for 12/20/2022   Primary hypertension 12/06/2022   Tremor of left hand 12/06/2022   Acute deep vein thrombosis (DVT) of popliteal vein of left lower extremity (HCC) 11/20/2022   Foot sprain, left, subsequent encounter 11/28/2019   Endometrial cancer (HCC) 07/05/2015    PCP: Booker Darice SAUNDERS, FNP  REFERRING PROVIDER: Zuleta, Kaitlin G, NP   REFERRING DIAG: [phone] (ICD-10-CM) - Levator spasm N32.81 (ICD-10-CM) - OAB (overactive bladder) R35.0 (ICD-10-CM) - Urinary frequency R15.2 (ICD-10-CM) - Fecal urgency  THERAPY DIAG:  Muscle weakness (generalized)  Unspecified lack of coordination  Pelvic pain  Other low back pain  Rationale for Evaluation and Treatment: Rehabilitation  ONSET DATE: 2022  SUBJECTIVE:  SUBJECTIVE STATEMENT: Pt states that she is feeling 85% better.    PAIN: 01/15/24 Are you having pain? Yes NPRS scale: 1/10 when walking Pain location: Vaginal  Pain type: aching, burning, throbbing, and bulging Pain description: intermittent   Aggravating factors: walking, especially on hard surfaces Relieving factors: lying down  PRECAUTIONS: Other: hx endometrial cancer  RED FLAGS: None   WEIGHT BEARING RESTRICTIONS: No  FALLS:  Has patient fallen in last 6 months? No  OCCUPATION: admin  ACTIVITY LEVEL : walking on treadmill   PLOF: Independent  PATIENT GOALS: decrease vaginal pain   PERTINENT HISTORY:  Abdominal hysterectomy 2017, hx endometrial cancer Sexual abuse: No  BOWEL  MOVEMENT: Pain with bowel movement: No Type of bowel movement:Type (Bristol Stool Scale) 4-7, Frequency 1-4x/day, and Strain no Fully empty rectum: Yes:   Leakage: Yes: but not since starting metamucil; she still has strong urgency Pads: Yes: 1 - more just in case Fiber supplement/laxative Yes - taking fiber  URINATION: Pain with urination: No Fully empty bladder: Yes: usually Stream: sometimes hard to start Urgency: yes, but getting better Frequency: 3-4x/day; she has timed fluids differently and now large improvement in nocturia, even slept through the night several times Fluid Intake: trying to get 60 oz a day Leakage: Urge to void, Walking to the bathroom, Coughing, Sneezing, Laughing, and Exercise Pads: Yes: just in case with small dribbles *history of frequent UTIs   INTERCOURSE:  Ability to have vaginal penetration Yes  Pain with intercourse: none DrynessYes  Climax: WNL Marinoff Scale: 0/3 Lubricant: sometimes   PREGNANCY: Vaginal deliveries 6 Tearing Yes: tear with second Episiotomy No C-section deliveries 0 Currently pregnant No  PROLAPSE: Pressure and Bulge   OBJECTIVE:  Note: Objective measures were completed at Evaluation unless otherwise noted.  10/25/23 PATIENT SURVEYS:   PFIQ-7: 67 (bladder)  COGNITION: Overall cognitive status: Within functional limits for tasks assessed     SENSATION: Light touch: Appears intact   FUNCTIONAL TESTS:  Squat: Lt weight shift Single leg stance:  Rt: pelvic drop with UE hand hold  Lt: pelvic drop with UE hand hold  Curl-up test: significant difficulty and abdominal doming   GAIT: Assistive device utilized: None Comments: forward flexed posture, decreased hip extension   POSTURE: rounded shoulders, forward head, decreased lumbar lordosis, increased thoracic kyphosis, posterior pelvic tilt, and Lt lower thoracic curvature   LUMBARAROM/PROM:  A/PROM A/PROM  Eval (% available)  Flexion 75  Extension 25   Right lateral flexion 50  Left lateral flexion 50  Right rotation 50  Left rotation 50   (Blank rows = not tested)  PALPATION:   General: tightness throughout thoracic and lumbar paraspinals  Pelvic Alignment: posterior pelvic tilt  Abdominal: tenderness throughout bil lower abdomen; increased sternocostal angle                External Perineal Exam: white areas on labia - contacted medical provided about checking for lichens sclerosis                             Internal Pelvic Floor: muscle atrophy throughout bil levator ani  Patient confirms identification and approves PT to assess internal pelvic floor and treatment Yes  PELVIC MMT:   MMT eval  Vaginal 2/5, 6 second endurance, 5 repeat contractions  Diastasis Recti 4 finger widths  (Blank rows = not tested)        TONE: Low tone  PROLAPSE: Grade 1 vaginal wall  laxity  TODAY'S TREATMENT:                                                                                                                              DATE:  01/15/24 Manual: Supine: Abdominal scar tissue mobilization Lower abdomen myofascial release  Soft tissue mobilization to lower abdominals  Neuromuscular re-education: Bridge with hip adduction, transversus abdominus, and pelvic floor muscle 2 x 10 Supine resisted march + red band 2 x 10 Seated hip adduction ball press with transversus abdominus and pelvic floor muscle 2 x 10 Seated hip abduction red band with transversus abdominus and pelvic floor muscle 2 x 10 Seated resisted march red band with transversus abdominus and pelvic floor muscle 2 x 10 Exercises: Lower trunk rotation 2 x 10 Open books 10x bil Modified thomas stretch 30 sec bil Seated piriformis stretch 30 sec bil    01/03/24 Manual: Supine: Abdominal scar tissue mobilization Lower abdomen myofascial release  Soft tissue mobilization to lower abdominals  Exercises: Lower trunk rotation 2 x 10 Open books 10x bil Supine bent  knee fall out 10x bil  Modified thomas stretch 2 min bil   12/24/23 Neuromuscular re-education: In depth review and clarification of initial HEP including long holds, quick flicks, urge drill, and the knack; written notes provided on handout to help clarify Supine hip adduction + pelvic floor muscles 2 x 10 Supine bridge + hip adduction + pelvic floor muscles 2 x 5 Supine march + red band 2 x 10 Exercises: Supine lower trunk rotation 2 x 10 Supine posterior pelvic tilts 10x Seated piriformis stretch 60 sec bil    PATIENT EDUCATION:  Education details: See above Person educated: Patient Education method: Programmer, Multimedia, Demonstration, Tactile cues, Verbal cues, and Handouts Education comprehension: verbalized understanding  HOME EXERCISE PROGRAM: [id number]  ASSESSMENT:  CLINICAL IMPRESSION: Patient is a 68 y.o. female who was seen today for physical therapy evaluation and treatment for vaginal pain and urinary and fecal urgency/incontinence. Pt has done extremely well in physical therapy reporting 85% improvement overall with bowel and bladder and 90% improvement in vaginal pain. She is now able to walk and go to the grocery store without increased pain and states that it feels good. Exercises updated today and we continued working manually in abdomen to review so she can do it at home. Due to progress and having met rehab goals, she is prepared for discharge at this time. She was encouraged to call with any questions or concerns.   OBJECTIVE IMPAIRMENTS: decreased activity tolerance, decreased coordination, decreased endurance, decreased mobility, decreased ROM, decreased strength, increased fascial restrictions, increased muscle spasms, impaired flexibility, impaired tone, improper body mechanics, postural dysfunction, and pain.   ACTIVITY LIMITATIONS: standing, continence, and locomotion level  PARTICIPATION LIMITATIONS: cleaning, laundry, community activity, occupation, and  shopping/walking for exercise  PERSONAL FACTORS: 1 comorbidity: medical history are also affecting patient's functional outcome.   REHAB POTENTIAL: Good  CLINICAL DECISION MAKING: Stable/uncomplicated  EVALUATION  COMPLEXITY: Low   GOALS: Goals reviewed with patient? Yes  SHORT TERM GOALS: Target date: 11/22/2023   Pt will be independent with HEP in order to improve activity tolerance.   Baseline: Goal status: MET 12/25/2023  2.  Patient will report 25% improve in vaignal pain in order to increase activity tolerance.   Baseline: 10/10 Goal status: MET 01/15/24  3.  Patient will report 25% improvement in urinary incontinence in order to decrease pad use, risk of infection, and quality of life.   Baseline: leaks on the way to the bathroom and stress incontinence Goal status: MET 01/15/24  4.  Pt will be able to correctly perform diaphragmatic breathing and appropriate pressure management in order to prevent worsening vaginal wall laxity and improve pelvic floor A/ROM.   Baseline:  Goal status:MET 01/15/24  5.  Pt will be independent with the knack, urge suppression technique, and double voiding in order to improve bladder habits and decrease urinary incontinence.   Baseline:  Goal status:MET 01/15/24    LONG TERM GOALS: Target date: 01/17/2024   Pt will be independent with advanced HEP in order to improve activity tolerance.   Baseline:  Goal status: MET 01/15/24  2.  Patient will report 75% improve in vaginal pain in order to increase activity tolerance.   Baseline: 10/10 - 1/10 Goal status: MET 01/15/24  3.  Patient will report 75% improvement in urinary incontinence in order to decrease pad use, decrease risk of infection, and improve quality of life.   Baseline:  Goal status: MET 01/15/24  4.  Pt will report improved fecal urgency and no episodes of incontinence when stool is loose.  Baseline: will leak when stool becomes looser  Goal status: MET  01/15/24  5.  Pt will be able to go 2-3 hours in between voids without urgency or incontinence in order to improve QOL and perform all functional activities with less difficulty.   Baseline:  Goal status:MET 01/15/24  6.  Pt will be able to walk around grocery store without any increase in vaginal pain or pressure. Baseline: Severe pain and pressure  Goal status: MET 01/15/24  PLAN:  PT FREQUENCY: -  PT DURATION: -   PLANNED INTERVENTIONS: -  PLAN FOR NEXT SESSION: discharge  PHYSICAL THERAPY DISCHARGE SUMMARY  Visits from Start of Care: 4  Current functional level related to goals / functional outcomes: Independent   Remaining deficits: See above   Education / Equipment: HEP   Patient agrees to discharge. Patient goals were met. Patient is being discharged due to meeting the stated rehab goals.   Josette Mares, PT, DPT11/25/258:38 AM

## 2024-01-16 ENCOUNTER — Ambulatory Visit: Admitting: Obstetrics and Gynecology

## 2024-01-25 ENCOUNTER — Ambulatory Visit: Admitting: Obstetrics and Gynecology

## 2024-02-13 ENCOUNTER — Other Ambulatory Visit: Payer: Self-pay | Admitting: Obstetrics and Gynecology

## 2024-02-13 DIAGNOSIS — N390 Urinary tract infection, site not specified: Secondary | ICD-10-CM

## 2024-03-02 ENCOUNTER — Telehealth: Payer: Self-pay

## 2024-03-02 NOTE — Telephone Encounter (Signed)
 Left a message for patient informing her that her appointment tomorrow needs to be rescheduled due to provider unavailable. I explained the office will be reaching out to her to reschedule.

## 2024-03-03 ENCOUNTER — Ambulatory Visit: Admitting: Obstetrics and Gynecology

## 2024-03-03 ENCOUNTER — Encounter: Payer: Self-pay | Admitting: *Deleted

## 2024-03-07 ENCOUNTER — Encounter: Payer: Self-pay | Admitting: Obstetrics and Gynecology

## 2024-03-07 ENCOUNTER — Ambulatory Visit: Admitting: Obstetrics and Gynecology

## 2024-03-07 VITALS — BP 152/83 | HR 67

## 2024-03-07 DIAGNOSIS — Z87440 Personal history of urinary (tract) infections: Secondary | ICD-10-CM | POA: Diagnosis not present

## 2024-03-07 DIAGNOSIS — N390 Urinary tract infection, site not specified: Secondary | ICD-10-CM

## 2024-03-07 DIAGNOSIS — L900 Lichen sclerosus et atrophicus: Secondary | ICD-10-CM | POA: Diagnosis not present

## 2024-03-07 MED ORDER — ESTRADIOL 0.01 % VA CREA: 1.0000 g | TOPICAL_CREAM | VAGINAL | 11 refills | Status: AC

## 2024-03-07 MED ORDER — METHENAMINE HIPPURATE 1 G PO TABS: 1.0000 g | ORAL_TABLET | Freq: Every day | ORAL | 11 refills | Status: AC

## 2024-03-07 NOTE — Progress Notes (Signed)
 [facility] Return Visit  SUBJECTIVE  History of Present Illness: Kimberly Griffith is a 69 y.o. female seen in follow-up for lichen sclerosus and recurrent UTI.   Noticing some anal leakage. She thought it was due to the clobetasol  ointment. Patient has been taking her metamucil cookies. But she backed off on the clobetasol  once a week an no longer had issues.    She is using the estrace  cream 3 times per week. She is using the methenamine  but ran out and needs a new Rx.   Past Medical History: Patient  has a past medical history of Allergy, Arthritis (2022), Blood transfusion without reported diagnosis (02/21/1991), Cancer (HCC) (02/21/2015), Chronic kidney disease, Clotting disorder, Headache, History of kidney stones (02/20/2006), Hypertension (2017), Neuromuscular disorder (HCC) (2020), Pneumonia (02/20/2013), and Tremor.   Past Surgical History: She  has a past surgical history that includes Tonsillectomy and adenoidectomy; Wisdom tooth extraction; Robotic assisted total hysterectomy with bilateral salpingo oophorectomy (Bilateral, 07/13/2015); Lymph node biopsy (Bilateral, 07/13/2015); Abdominal hysterectomy (2017); and Colonoscopy (02/2016).   Medications: She has a current medication list which includes the following prescription(s): d3, clobetasol  propionate e, cranberry, cyclobenzaprine , doxycycline , [START ON 03/10/2024] estradiol , estradiol , hydrochlorothiazide , lactase, multivitamin, mupirocin 2% oint-hydrocortisone 2.5% cream-nystatin  cream-zinc  oxide 13% oint 1:1:1:5 mixture, nystatin , phenazopyridine , triamcinolone  cream, and methenamine , and the following Facility-Administered Medications: psyllium.   Allergies: Patient is allergic to kiwi extract, bactrim  [sulfamethoxazole -trimethoprim ], amoxicillin , and microplegia msa-msg [plegisol].   Social History: Patient  reports that she quit smoking about 39 years ago. Her smoking use included cigarettes. She does not have  any smokeless tobacco history on file. She reports current alcohol use of about 1.0 standard drink of alcohol per week. She reports that she does not use drugs.     OBJECTIVE    Physical Exam: Vitals:   03/07/24 1045  BP: (!) 152/83  Pulse: 67    Gen: No apparent distress, A&O x 3.  Detailed Urogynecologic Evaluation:  External vaginal exam done. Vulvar tissues appear normal and well mositurized.    ASSESSMENT AND PLAN    Kimberly Griffith is a 69 y.o. with:  1. Lichen sclerosus   2. Recurrent UTI    - Continue with estrace  cream 3x a week and clobetasol  1-2 times per week - For UTI prevention, she was provided with refill of methenamine  - Continue with metamucil daily for stool bulking. Avoid irritative foods.   Follow up 6 months or sooner if needed   Rosaline LOISE Caper, MD   Time spent: I spent 20 minutes dedicated to the care of this patient on the date of this encounter to include pre-visit review of records, face-to-face time with the patient and post visit documentation and ordering medication/ testing.

## 2024-03-17 ENCOUNTER — Ambulatory Visit: Payer: Medicare Other | Admitting: Neurology

## 2024-03-24 ENCOUNTER — Ambulatory Visit: Admitting: Family Medicine
